# Patient Record
Sex: Female | Born: 1960 | Race: White | Hispanic: No | Smoking: Current every day smoker
Health system: Southern US, Community
[De-identification: ages and names within clinical notes are randomized; demographics above are authoritative.]

## PROBLEM LIST (undated history)

## (undated) DIAGNOSIS — K219 Gastro-esophageal reflux disease without esophagitis: Secondary | ICD-10-CM

## (undated) DIAGNOSIS — I10 Essential (primary) hypertension: Secondary | ICD-10-CM

## (undated) DIAGNOSIS — F32A Depression, unspecified: Secondary | ICD-10-CM

## (undated) DIAGNOSIS — J189 Pneumonia, unspecified organism: Secondary | ICD-10-CM

## (undated) DIAGNOSIS — R0602 Shortness of breath: Secondary | ICD-10-CM

## (undated) DIAGNOSIS — M797 Fibromyalgia: Secondary | ICD-10-CM

## (undated) DIAGNOSIS — B1920 Unspecified viral hepatitis C without hepatic coma: Secondary | ICD-10-CM

## (undated) DIAGNOSIS — M1990 Unspecified osteoarthritis, unspecified site: Secondary | ICD-10-CM

## (undated) DIAGNOSIS — F419 Anxiety disorder, unspecified: Secondary | ICD-10-CM

## (undated) DIAGNOSIS — J449 Chronic obstructive pulmonary disease, unspecified: Secondary | ICD-10-CM

## (undated) DIAGNOSIS — Z9981 Dependence on supplemental oxygen: Secondary | ICD-10-CM

## (undated) DIAGNOSIS — F329 Major depressive disorder, single episode, unspecified: Secondary | ICD-10-CM

## (undated) DIAGNOSIS — J302 Other seasonal allergic rhinitis: Secondary | ICD-10-CM

## (undated) DIAGNOSIS — E119 Type 2 diabetes mellitus without complications: Secondary | ICD-10-CM

## (undated) DIAGNOSIS — J329 Chronic sinusitis, unspecified: Secondary | ICD-10-CM

## (undated) DIAGNOSIS — R0902 Hypoxemia: Secondary | ICD-10-CM

## (undated) DIAGNOSIS — T7840XA Allergy, unspecified, initial encounter: Secondary | ICD-10-CM

## (undated) HISTORY — DX: Allergy, unspecified, initial encounter: T78.40XA

## (undated) HISTORY — PX: NASAL FRACTURE SURGERY: SHX718

## (undated) HISTORY — DX: Chronic sinusitis, unspecified: J32.9

## (undated) HISTORY — DX: Other seasonal allergic rhinitis: J30.2

## (undated) HISTORY — PX: MANDIBLE FRACTURE SURGERY: SHX706

## (undated) HISTORY — DX: Gastro-esophageal reflux disease without esophagitis: K21.9

## (undated) HISTORY — DX: Hypoxemia: R09.02

## (undated) HISTORY — DX: Chronic obstructive pulmonary disease, unspecified: J44.9

## (undated) HISTORY — DX: Anxiety disorder, unspecified: F41.9

## (undated) HISTORY — PX: OTHER SURGICAL HISTORY: SHX169

## (undated) HISTORY — DX: Dependence on supplemental oxygen: Z99.81

## (undated) HISTORY — DX: Pneumonia, unspecified organism: J18.9

---

## 2000-03-04 ENCOUNTER — Emergency Department (HOSPITAL_COMMUNITY): Admission: EM | Admit: 2000-03-04 | Discharge: 2000-03-04 | Payer: Self-pay | Admitting: Emergency Medicine

## 2000-12-24 ENCOUNTER — Encounter: Payer: Self-pay | Admitting: Emergency Medicine

## 2000-12-24 ENCOUNTER — Emergency Department (HOSPITAL_COMMUNITY): Admission: EM | Admit: 2000-12-24 | Discharge: 2000-12-24 | Payer: Self-pay

## 2001-12-19 ENCOUNTER — Encounter: Payer: Self-pay | Admitting: *Deleted

## 2001-12-19 ENCOUNTER — Emergency Department (HOSPITAL_COMMUNITY): Admission: EM | Admit: 2001-12-19 | Discharge: 2001-12-19 | Payer: Self-pay | Admitting: *Deleted

## 2003-03-25 ENCOUNTER — Emergency Department (HOSPITAL_COMMUNITY): Admission: EM | Admit: 2003-03-25 | Discharge: 2003-03-26 | Payer: Self-pay | Admitting: Emergency Medicine

## 2003-03-25 ENCOUNTER — Encounter: Payer: Self-pay | Admitting: Emergency Medicine

## 2004-02-19 ENCOUNTER — Emergency Department (HOSPITAL_COMMUNITY): Admission: EM | Admit: 2004-02-19 | Discharge: 2004-02-20 | Payer: Self-pay | Admitting: Family Medicine

## 2005-11-14 ENCOUNTER — Emergency Department (HOSPITAL_COMMUNITY): Admission: EM | Admit: 2005-11-14 | Discharge: 2005-11-15 | Payer: Self-pay | Admitting: Emergency Medicine

## 2006-07-28 ENCOUNTER — Emergency Department (HOSPITAL_COMMUNITY): Admission: EM | Admit: 2006-07-28 | Discharge: 2006-07-28 | Payer: Self-pay | Admitting: Emergency Medicine

## 2007-06-19 ENCOUNTER — Emergency Department (HOSPITAL_COMMUNITY): Admission: EM | Admit: 2007-06-19 | Discharge: 2007-06-19 | Payer: Self-pay | Admitting: Emergency Medicine

## 2007-09-15 ENCOUNTER — Encounter: Admission: RE | Admit: 2007-09-15 | Discharge: 2007-09-15 | Payer: Self-pay | Admitting: Family Medicine

## 2007-10-16 ENCOUNTER — Emergency Department (HOSPITAL_COMMUNITY): Admission: EM | Admit: 2007-10-16 | Discharge: 2007-10-17 | Payer: Self-pay | Admitting: Emergency Medicine

## 2007-10-16 ENCOUNTER — Ambulatory Visit: Payer: Self-pay | Admitting: Internal Medicine

## 2007-10-16 DIAGNOSIS — J449 Chronic obstructive pulmonary disease, unspecified: Secondary | ICD-10-CM | POA: Insufficient documentation

## 2007-10-23 ENCOUNTER — Ambulatory Visit: Payer: Self-pay | Admitting: Internal Medicine

## 2007-10-23 ENCOUNTER — Encounter: Payer: Self-pay | Admitting: Internal Medicine

## 2007-10-26 DIAGNOSIS — R0600 Dyspnea, unspecified: Secondary | ICD-10-CM | POA: Insufficient documentation

## 2007-10-26 DIAGNOSIS — R0609 Other forms of dyspnea: Secondary | ICD-10-CM

## 2007-11-06 ENCOUNTER — Ambulatory Visit: Payer: Self-pay | Admitting: Internal Medicine

## 2007-11-06 ENCOUNTER — Telehealth: Payer: Self-pay | Admitting: Internal Medicine

## 2007-11-06 DIAGNOSIS — R29818 Other symptoms and signs involving the nervous system: Secondary | ICD-10-CM | POA: Insufficient documentation

## 2007-11-08 ENCOUNTER — Telehealth (INDEPENDENT_AMBULATORY_CARE_PROVIDER_SITE_OTHER): Payer: Self-pay | Admitting: *Deleted

## 2007-12-15 ENCOUNTER — Emergency Department (HOSPITAL_COMMUNITY): Admission: EM | Admit: 2007-12-15 | Discharge: 2007-12-15 | Payer: Self-pay | Admitting: Emergency Medicine

## 2008-01-18 ENCOUNTER — Telehealth (INDEPENDENT_AMBULATORY_CARE_PROVIDER_SITE_OTHER): Payer: Self-pay | Admitting: *Deleted

## 2008-02-28 ENCOUNTER — Telehealth (INDEPENDENT_AMBULATORY_CARE_PROVIDER_SITE_OTHER): Payer: Self-pay | Admitting: *Deleted

## 2008-02-28 ENCOUNTER — Ambulatory Visit: Payer: Self-pay | Admitting: Internal Medicine

## 2008-02-28 DIAGNOSIS — G4700 Insomnia, unspecified: Secondary | ICD-10-CM | POA: Insufficient documentation

## 2008-03-11 ENCOUNTER — Emergency Department (HOSPITAL_COMMUNITY): Admission: EM | Admit: 2008-03-11 | Discharge: 2008-03-11 | Payer: Self-pay | Admitting: Emergency Medicine

## 2008-03-11 ENCOUNTER — Encounter: Payer: Self-pay | Admitting: Internal Medicine

## 2008-03-13 ENCOUNTER — Emergency Department (HOSPITAL_COMMUNITY): Admission: EM | Admit: 2008-03-13 | Discharge: 2008-03-13 | Payer: Self-pay | Admitting: Emergency Medicine

## 2008-03-28 ENCOUNTER — Ambulatory Visit: Payer: Self-pay | Admitting: Internal Medicine

## 2008-06-26 ENCOUNTER — Telehealth: Payer: Self-pay | Admitting: Internal Medicine

## 2008-07-05 ENCOUNTER — Telehealth (INDEPENDENT_AMBULATORY_CARE_PROVIDER_SITE_OTHER): Payer: Self-pay | Admitting: *Deleted

## 2008-07-07 ENCOUNTER — Emergency Department (HOSPITAL_COMMUNITY): Admission: EM | Admit: 2008-07-07 | Discharge: 2008-07-07 | Payer: Self-pay | Admitting: Emergency Medicine

## 2008-07-08 ENCOUNTER — Telehealth (INDEPENDENT_AMBULATORY_CARE_PROVIDER_SITE_OTHER): Payer: Self-pay | Admitting: *Deleted

## 2008-07-12 ENCOUNTER — Telehealth: Payer: Self-pay | Admitting: Internal Medicine

## 2008-07-16 ENCOUNTER — Ambulatory Visit: Payer: Self-pay | Admitting: Internal Medicine

## 2008-07-25 ENCOUNTER — Telehealth: Payer: Self-pay | Admitting: Internal Medicine

## 2008-08-05 ENCOUNTER — Ambulatory Visit: Payer: Self-pay | Admitting: Internal Medicine

## 2008-08-05 ENCOUNTER — Telehealth (INDEPENDENT_AMBULATORY_CARE_PROVIDER_SITE_OTHER): Payer: Self-pay | Admitting: *Deleted

## 2008-09-16 ENCOUNTER — Telehealth: Payer: Self-pay | Admitting: Internal Medicine

## 2008-09-19 ENCOUNTER — Encounter: Payer: Self-pay | Admitting: Internal Medicine

## 2008-10-15 ENCOUNTER — Telehealth (INDEPENDENT_AMBULATORY_CARE_PROVIDER_SITE_OTHER): Payer: Self-pay | Admitting: *Deleted

## 2008-10-16 ENCOUNTER — Emergency Department (HOSPITAL_COMMUNITY): Admission: EM | Admit: 2008-10-16 | Discharge: 2008-10-16 | Payer: Self-pay | Admitting: Emergency Medicine

## 2008-10-16 ENCOUNTER — Encounter: Payer: Self-pay | Admitting: Internal Medicine

## 2008-10-17 ENCOUNTER — Telehealth (INDEPENDENT_AMBULATORY_CARE_PROVIDER_SITE_OTHER): Payer: Self-pay | Admitting: *Deleted

## 2008-10-21 ENCOUNTER — Ambulatory Visit: Payer: Self-pay | Admitting: Internal Medicine

## 2008-10-21 DIAGNOSIS — J329 Chronic sinusitis, unspecified: Secondary | ICD-10-CM | POA: Insufficient documentation

## 2008-10-21 DIAGNOSIS — Z720 Tobacco use: Secondary | ICD-10-CM | POA: Insufficient documentation

## 2008-10-28 ENCOUNTER — Encounter: Payer: Self-pay | Admitting: Internal Medicine

## 2008-10-29 ENCOUNTER — Telehealth (INDEPENDENT_AMBULATORY_CARE_PROVIDER_SITE_OTHER): Payer: Self-pay | Admitting: *Deleted

## 2008-11-22 ENCOUNTER — Observation Stay (HOSPITAL_COMMUNITY): Admission: EM | Admit: 2008-11-22 | Discharge: 2008-11-23 | Payer: Self-pay | Admitting: Emergency Medicine

## 2008-11-24 ENCOUNTER — Emergency Department (HOSPITAL_COMMUNITY): Admission: EM | Admit: 2008-11-24 | Discharge: 2008-11-24 | Payer: Self-pay | Admitting: Emergency Medicine

## 2008-12-17 ENCOUNTER — Encounter: Admission: RE | Admit: 2008-12-17 | Discharge: 2008-12-17 | Payer: Self-pay | Admitting: Orthopedic Surgery

## 2008-12-25 ENCOUNTER — Telehealth (INDEPENDENT_AMBULATORY_CARE_PROVIDER_SITE_OTHER): Payer: Self-pay | Admitting: *Deleted

## 2008-12-27 ENCOUNTER — Ambulatory Visit: Payer: Self-pay | Admitting: Internal Medicine

## 2009-01-03 ENCOUNTER — Telehealth: Payer: Self-pay | Admitting: Internal Medicine

## 2009-01-08 ENCOUNTER — Telehealth: Payer: Self-pay | Admitting: Internal Medicine

## 2009-02-18 ENCOUNTER — Emergency Department (HOSPITAL_COMMUNITY): Admission: EM | Admit: 2009-02-18 | Discharge: 2009-02-18 | Payer: Self-pay | Admitting: Emergency Medicine

## 2009-02-18 ENCOUNTER — Telehealth: Payer: Self-pay | Admitting: Internal Medicine

## 2009-03-04 ENCOUNTER — Telehealth: Payer: Self-pay | Admitting: Internal Medicine

## 2009-04-02 ENCOUNTER — Telehealth: Payer: Self-pay | Admitting: Internal Medicine

## 2009-04-07 ENCOUNTER — Telehealth: Payer: Self-pay | Admitting: Internal Medicine

## 2009-04-21 ENCOUNTER — Encounter: Payer: Self-pay | Admitting: Internal Medicine

## 2009-04-21 ENCOUNTER — Emergency Department (HOSPITAL_COMMUNITY): Admission: EM | Admit: 2009-04-21 | Discharge: 2009-04-21 | Payer: Self-pay | Admitting: Emergency Medicine

## 2009-04-28 ENCOUNTER — Ambulatory Visit: Payer: Self-pay | Admitting: Internal Medicine

## 2009-05-05 ENCOUNTER — Encounter: Payer: Self-pay | Admitting: Internal Medicine

## 2009-05-12 ENCOUNTER — Telehealth (INDEPENDENT_AMBULATORY_CARE_PROVIDER_SITE_OTHER): Payer: Self-pay | Admitting: *Deleted

## 2009-05-21 ENCOUNTER — Encounter: Payer: Self-pay | Admitting: Internal Medicine

## 2009-06-27 ENCOUNTER — Encounter: Payer: Self-pay | Admitting: Internal Medicine

## 2009-06-30 ENCOUNTER — Telehealth (INDEPENDENT_AMBULATORY_CARE_PROVIDER_SITE_OTHER): Payer: Self-pay | Admitting: *Deleted

## 2009-08-07 ENCOUNTER — Telehealth: Payer: Self-pay | Admitting: Internal Medicine

## 2009-08-09 DIAGNOSIS — B1920 Unspecified viral hepatitis C without hepatic coma: Secondary | ICD-10-CM

## 2009-08-09 HISTORY — DX: Unspecified viral hepatitis C without hepatic coma: B19.20

## 2009-08-25 ENCOUNTER — Ambulatory Visit: Payer: Self-pay | Admitting: Internal Medicine

## 2009-09-11 ENCOUNTER — Encounter: Payer: Self-pay | Admitting: Internal Medicine

## 2009-10-06 ENCOUNTER — Telehealth: Payer: Self-pay | Admitting: Internal Medicine

## 2009-10-06 ENCOUNTER — Inpatient Hospital Stay (HOSPITAL_COMMUNITY): Admission: EM | Admit: 2009-10-06 | Discharge: 2009-10-09 | Payer: Self-pay | Admitting: Emergency Medicine

## 2009-10-08 ENCOUNTER — Telehealth: Payer: Self-pay | Admitting: Internal Medicine

## 2009-11-04 ENCOUNTER — Ambulatory Visit: Payer: Self-pay | Admitting: Internal Medicine

## 2009-11-04 ENCOUNTER — Telehealth: Payer: Self-pay | Admitting: Internal Medicine

## 2009-11-04 DIAGNOSIS — K219 Gastro-esophageal reflux disease without esophagitis: Secondary | ICD-10-CM | POA: Insufficient documentation

## 2010-02-05 ENCOUNTER — Inpatient Hospital Stay (HOSPITAL_COMMUNITY): Admission: EM | Admit: 2010-02-05 | Discharge: 2010-02-07 | Payer: Self-pay | Admitting: Emergency Medicine

## 2010-02-05 ENCOUNTER — Encounter: Payer: Self-pay | Admitting: Internal Medicine

## 2010-02-24 ENCOUNTER — Telehealth: Payer: Self-pay | Admitting: Internal Medicine

## 2010-02-24 ENCOUNTER — Ambulatory Visit: Payer: Self-pay | Admitting: Internal Medicine

## 2010-02-24 DIAGNOSIS — E119 Type 2 diabetes mellitus without complications: Secondary | ICD-10-CM | POA: Insufficient documentation

## 2010-02-27 ENCOUNTER — Encounter: Payer: Self-pay | Admitting: Internal Medicine

## 2010-03-02 ENCOUNTER — Emergency Department (HOSPITAL_COMMUNITY): Admission: EM | Admit: 2010-03-02 | Discharge: 2010-03-02 | Payer: Self-pay | Admitting: Emergency Medicine

## 2010-03-03 ENCOUNTER — Telehealth (INDEPENDENT_AMBULATORY_CARE_PROVIDER_SITE_OTHER): Payer: Self-pay | Admitting: *Deleted

## 2010-03-10 ENCOUNTER — Telehealth: Payer: Self-pay | Admitting: Internal Medicine

## 2010-04-27 ENCOUNTER — Ambulatory Visit: Payer: Self-pay | Admitting: Internal Medicine

## 2010-04-28 DIAGNOSIS — F411 Generalized anxiety disorder: Secondary | ICD-10-CM | POA: Insufficient documentation

## 2010-04-29 ENCOUNTER — Telehealth (INDEPENDENT_AMBULATORY_CARE_PROVIDER_SITE_OTHER): Payer: Self-pay | Admitting: *Deleted

## 2010-05-07 ENCOUNTER — Telehealth (INDEPENDENT_AMBULATORY_CARE_PROVIDER_SITE_OTHER): Payer: Self-pay | Admitting: *Deleted

## 2010-05-22 ENCOUNTER — Telehealth: Payer: Self-pay | Admitting: Internal Medicine

## 2010-05-27 ENCOUNTER — Observation Stay (HOSPITAL_COMMUNITY)
Admission: EM | Admit: 2010-05-27 | Discharge: 2010-05-29 | Payer: Self-pay | Source: Home / Self Care | Admitting: Emergency Medicine

## 2010-05-27 ENCOUNTER — Ambulatory Visit: Payer: Self-pay | Admitting: Internal Medicine

## 2010-06-01 ENCOUNTER — Telehealth (INDEPENDENT_AMBULATORY_CARE_PROVIDER_SITE_OTHER): Payer: Self-pay | Admitting: *Deleted

## 2010-06-24 ENCOUNTER — Encounter: Payer: Self-pay | Admitting: Internal Medicine

## 2010-07-27 ENCOUNTER — Ambulatory Visit: Payer: Self-pay | Admitting: Internal Medicine

## 2010-07-28 ENCOUNTER — Telehealth (INDEPENDENT_AMBULATORY_CARE_PROVIDER_SITE_OTHER): Payer: Self-pay | Admitting: *Deleted

## 2010-08-12 ENCOUNTER — Telehealth (INDEPENDENT_AMBULATORY_CARE_PROVIDER_SITE_OTHER): Payer: Self-pay | Admitting: *Deleted

## 2010-08-18 ENCOUNTER — Telehealth (INDEPENDENT_AMBULATORY_CARE_PROVIDER_SITE_OTHER): Payer: Self-pay | Admitting: *Deleted

## 2010-08-27 ENCOUNTER — Telehealth (INDEPENDENT_AMBULATORY_CARE_PROVIDER_SITE_OTHER): Payer: Self-pay | Admitting: *Deleted

## 2010-08-30 ENCOUNTER — Encounter: Payer: Self-pay | Admitting: Family Medicine

## 2010-09-08 ENCOUNTER — Telehealth (INDEPENDENT_AMBULATORY_CARE_PROVIDER_SITE_OTHER): Payer: Self-pay | Admitting: *Deleted

## 2010-09-08 DIAGNOSIS — B1710 Acute hepatitis C without hepatic coma: Secondary | ICD-10-CM | POA: Insufficient documentation
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
## 2010-09-08 NOTE — Medication Information (Signed)
Summary: PAP [facility] & Combivent/[facility]  PAP [facility] Combivent/[facility]   Imported By: Lester [place] 06/30/2009 10:42:17  _____________________________________________________________________  External Attachment:    Type:   Image     Comment:   External Document

## 2010-09-08 NOTE — Medication Information (Signed)
Summary: Medication List/[facility]  Medication List/[facility]   Imported By: Sherian Rein 02/27/2010 08:11:32  _____________________________________________________________________  External Attachment:    Type:   Image     Comment:   External Document

## 2010-09-08 NOTE — Miscellaneous (Signed)
Summary: Orders UpdatePFT CHARGES   Clinical Lists Changes  Orders: Added new Service order of Carbon Monoxide diffusing w/capacity (94720) - Signed Added new Service order of Lung Volumes (94240) - Signed Added new Service order of Spirometry (Pre & Post) (94060) - Signed 

## 2010-09-08 NOTE — Procedures (Signed)
Summary: Oximetry/[facility]  Oximetry/[facility]   Imported By: Sherian Rein 03/13/2010 13:08:24  _____________________________________________________________________  External Attachment:    Type:   Image     Comment:   External Document
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
## 2010-09-08 NOTE — Progress Notes (Signed)
Summary: Letter stating illness is terminal   Phone Note Call from Patient   Caller: Patient Call For: young Summary of Call: need letter stating her condition is terminal for disability. Initial call taken by: Rickard Patience,  May 07, 2010 4:39 PM  Follow-up for Phone Call        Called and spoke with pt.  She states that she was turned down for disability and now is requesting that CDY dictate a letter to Donnamarie Rossetti [facility]) stating that her condition is a terminal illness and therefore needs disability.  If CDY does do a letter, she would like this to be mailed to her home.  Please advise thanks Follow-up by: Vernie Murders,  May 07, 2010 5:16 PM  Additional Follow-up for Phone Call Additional follow up Details #1::        I am sorry but her condition is not a terminal illness and I cannot write this letter. Additional Follow-up by: Waymon Budge MD,  May 07, 2010 5:26 PM    Additional Follow-up for Phone Call Additional follow up Details #2::    ATC pt's home number x 3 -- line busy.  WCB Crystal Levandowski RN  May 08, 2010 9:39 AM   Spoke with pt.  She was informed of above per CY and verbalized understanding.    Follow-up by: Gweneth Dimitri RN,  May 08, 2010 10:34 AM

## 2010-09-08 NOTE — Progress Notes (Signed)
Summary: prescription questions  Phone Note From Pharmacy   Caller: amy[facility] Call For: Megan Soto  Summary of Call: Has a question about hydrocodone -acetaminophen 10-660 mg rx.Darletta Moll,  November 04, 2009 11:48 AM     Follow-up for Phone Call        [facility] pharmacy wants to make CY aware that pt just filled Hydrocodone-apap 10/500 #120 tablets, one tab four times daily on 10/18/09 by Dr. Thurmond Butts. They wanted to know make CY aware before they fiiled his rx. Please advise. Carron Curie CMA  November 04, 2009 12:19 PM   TD asked CY and per him do not fill his rx, and have it discontinued at the pharmacy. I called and spoke to Amy at [facility] and advised do not fill CY rx and to canel it out. She will notify pt. Carron Curie CMA  November 04, 2009 12:25 PM

## 2010-09-08 NOTE — Progress Notes (Signed)
Summary: rx req-LMTCB x 1-LMTCbx1  Phone Note Call from Patient Call back at Home Phone [phone]   Caller: Patient Call For: Florestine Carmical Summary of Call: pt wants rx for combivent called in to [facility]. wants nurse to "overide rx" so medicaid will cover since she has used up her combivent as well as ventolin (she doesn't want ventolin- doesn't work as well per pt).  Initial call taken by: Tivis Ringer, CNA,  May 22, 2010 11:59 AM  Follow-up for Phone Call        Called and spoke with pharmacist at [facility].  She states that there is an rx there for combivent waiting for her- However, she informed me that she had med filled under Dr Maple Hudson on 05/09/10 and then gave pharmacy new rx from a Dr Erlinda Hong on 05/13/10 with new directions to take 8 puffs of combivent daily.  Pharmacist did not fill this yet.  Need to find out why she is using so  much and why rx was written by another doc-LMTCB  Follow-up by: Vernie Murders,  May 22, 2010 1:44 PM  Additional Follow-up for Phone Call Additional follow up Details #1::        Spoke with pt.  She states that she has already picked up rx for combivent.  She states that she has been using this 5 times per day at least due to increased stress from spouse being sick and also "doing alot of running around"- will forward to cdy to be aware. Additional Follow-up by: Vernie Murders,  May 22, 2010 3:25 PM    Additional Follow-up for Phone Call Additional follow up Details #2::    I am not going to authorize over-rides. The meds were scripted the way they are supposed to be used.  Follow-up by: Waymon Budge MD,  May 26, 2010 1:15 PM  Additional Follow-up for Phone Call Additional follow up Details #3:: Details for Additional Follow-up Action Taken: LMTCBx1 to advise no over-rides on Rx.Jennifer [facility] CMA  May 26, 2010 2:01 PM    called to make pt aware of CY recs about the combivent---pts husband stated that the pt is in the  hospital at [facility] [place] CMA  May 28, 2010 4:05 PM
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
## 2010-09-08 NOTE — Progress Notes (Signed)
Summary: RESULTS  Phone Note Call from Patient   Caller: Patient Call For: Mahika Vanvoorhis Summary of Call: PT CALLING FOR OVER NITE OXYGEN RESULTS. LEAVE MGS IF NO ANSWER. Initial call taken by: Rickard Patience,  March 10, 2010 10:06 AM  Follow-up for Phone Call        Please advise on ONO results from [facility].Michel Bickers [facility]  March 10, 2010 10:25 AM  Additional Follow-up for Phone Call Additional follow up Details #1::        Overnight oxygen levels were within normal limits on room air. They won't pay for oxygen for her. Additional Follow-up by: Waymon Budge MD,  March 11, 2010 12:36 PM    Additional Follow-up for Phone Call Additional follow up Details #2::    Pt informed. Ryiah Bellissimo CMA  March 11, 2010 12:40 PM

## 2010-09-08 NOTE — Letter (Signed)
Summary: Internal Correspondence  Internal Correspondence   Imported By: Lehman Prom 09/19/2008 16:09:56  _____________________________________________________________________  External Attachment:    Type:   Image     Comment:   External Document

## 2010-09-08 NOTE — Progress Notes (Signed)
Summary: cough  Phone Note Call from Patient   Caller: Patient Call For: Odilia Damico Summary of Call: need cough syrup for cough [facility] [phone] Initial call taken by: Rickard Patience,  March 04, 2009 4:33 PM  Follow-up for Phone Call        pt c/o cough with clear mucus and is requesting an rx forpromethazine w/ cod. she denies any sob, wheezing, sore throat,or fever. pt recently seen in ed. pt says she is unable to come in for ov. pls advise. NKDA listed for this pt--Patient advised to call 911    Additional Follow-up for Phone Call Additional follow up Details #1::        per cdy pt will need ov for cough syrup--pt aware Additional Follow-up by: Philipp Deputy CMA,  March 04, 2009 6:01 PM
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
## 2010-09-08 NOTE — Progress Notes (Signed)
Summary: cough  Phone Note Call from Patient Call back at Home Phone [phone]   Caller: Patient Call For: young Summary of Call: pt is having coughing problems straining when coughing what should she do. wants to be seen. Initial call taken by: Lacinda Axon,  August 05, 2008 10:17 AM  Follow-up for Phone Call        Spoke with pt.  She states that cough has worsened.  Coughing to the point of vommiting.  Sputum is thick and green.  ov with CY at 2 pm today. Follow-up by: Vernie Murders,  August 05, 2008 10:39 AM

## 2010-09-08 NOTE — Progress Notes (Signed)
Summary: coughing up blood  Phone Note Call from Patient Call back at Home Phone [phone]   Caller: Patient Call For: Anagabriela Jokerst Summary of Call: pt states she has been coughing up blood since yesterday. says " everytime she coughs there is blood". light red in color.  Initial call taken by: Tivis Ringer,  February 18, 2009 11:10 AM  Follow-up for Phone Call        called and spoke with pt.  pt c/o hemoptysis since yesterday.  Pt states she will cough up thick white sputum with bright red blood in it.  Pt also c/o increased sob, tightness in chest, and wheezing.  Offered pt an appt today.  Pt declined stating she did not have a ride.  Please avise.  Aundra Millet Reynolds LPN  February 18, 2009 11:17 AM  NKDA   Additional Follow-up for Phone Call Additional follow up Details #1::        Per Dr. Maple Hudson, call in doxycycline 100 mg # 8 2 today and then 1 daily and also needs to try delsym OTC cough syrup.  ATC pt with recs.  Was advised per family member that pt has just been taken to the ER at [facility].  Advised that I would let Dr. Maple Hudson know.   Additional Follow-up by: Vernie Murders,  February 18, 2009 2:00 PM
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
## 2010-09-08 NOTE — Progress Notes (Signed)
Summary: sample-letter to lawyer- pt called back again  Phone Note Call from Patient Call back at Home Phone [phone]   Caller: Patient Call For: Megan Soto Reason for Call: Talk to Nurse Summary of Call: sample of spiriva mailed to her and has [employer] told pt's lawyer that pt is unable to work? Initial call taken by: Eugene Gavia,  August 07, 2009 1:55 PM  Follow-up for Phone Call        advised pt that we could not mail samples. Asked pt about spiriva assistance program. Pt states she never heard anything from the company. I see the paperwork scanned in. I will forward this message to [facility] to ask if they can follow-up on paperwork. Also Pt request that Dr. Maple Hudson write a letter stating that the patient is unable to work. She request this be sent to her lawyer [name]. Please advise if I can give the pt some samples of spiriva and on letter. thanks. Carron Curie CMA  August 07, 2009 2:25 PM   Additional Follow-up for Phone Call Additional follow up Details #1::        i will follow up on the spiriva program but dr Delontae Lamm will need to do the rest    Additional Follow-up for Phone Call Additional follow up Details #2::    pt called back again today re: status of letter to be sent to her lawyer. call pt at [phone]. Tivis Ringer  August 11, 2009 3:00 PM  Additional Follow-up for Phone Call Additional follow up Details #3:: Details for Additional Follow-up Action Taken: I hlast saw her in September, and am not able to send a letter about her work status. Additional Follow-up by: Waymon Budge MD,  August 12, 2009 1:18 PM  pt has appt scheduled for 08/25/2009 with CY. Carron Curie CMA  August 12, 2009 2:25 PM

## 2010-09-08 NOTE — Progress Notes (Signed)
Summary: disability  Phone Note Call from Patient Call back at Home Phone [phone]   Caller: Patient Call For: Megan Soto Reason for Call: Talk to Nurse Summary of Call: pt wants to know if CY will call social security office and tell them what is wrong with her to hurry her disability process?  They can fax everything to him [phone].  [address] Initial call taken by: Eugene Gavia,  July 25, 2008 3:02 PM  Follow-up for Phone Call        Discussed at office visit. Follow-up by: Waymon Budge MD,  August 07, 2008 1:10 PM

## 2010-09-08 NOTE — Assessment & Plan Note (Signed)
Summary: COPD, SOB/RJC  Nurse Visit   Vital Signs:  Patient Profile:   50 Years Old Female Pulse rate:   92 / minute BP sitting:   114 / 76  Vitals Entered By: Boone Master CNA (October 23, 2007 5:23 PM)                 Prior Medications: COMBIVENT 103-18 MCG/ACT  AERO (IPRATROPIUM-ALBUTEROL) as needed AMITRIPTYLINE HCL 25 MG  TABS (AMITRIPTYLINE HCL) take 1 to 2 tablets at bedtime Current Allergies: No known allergies     Orders Added: 1)  Pulmonary Stress (6 min walk) [place]    ]  Six Minute Walk Test Medications taken before test(dose and time): vicodin taken at 9am Supplemental oxygen during the test: No  Lap counter(place a tick mark inside a square for each lap completed) lap 1 complete  lap 2 complete   lap 3 complete   lap 4 complete  lap 5 complete  lap 6 complete  lap 7 complete   lap 8 complete   lap 9 complete   Baseline  BP sitting: 114/ 76 Heart rate: 92 Dyspnea ( Borg scale) 7 Fatigue (Borg scale) 3 SPO2 97% ra  End Of Test  BP sitting: 110/ 70 Heart rate: 109 Dyspnea ( Borg scale) 7 Fatigue (Borg scale) 2 SPO2 99% ra  2 Minutes post  BP sitting: 112/ 74 Heart rate: 81 SPO2 100% ra  Stopped or paused before six minutes? No Other symptoms at end of exercise: Dizziness, Leg pain  Interpretation: Number of laps  9 X 48 meters =   432 meters+ final partial lap: 28 meters =    460 meters   Total distance walked in six minutes: 460 meters  Tech ID: Boone Master CNA (October 23, 2007 5:25 PM) Tech Comments pt completed test without stopping.  complained of dizziness and back pain at end of test, resolved with rest.

## 2010-09-08 NOTE — Medication Information (Signed)
Summary: Tax adviser   Imported By: Valinda Hoar 03/11/2008 15:53:11  _____________________________________________________________________  External Attachment:    Type:   Image     Comment:   External Document

## 2010-09-08 NOTE — Assessment & Plan Note (Signed)
Summary: copd/ mbw   Visit Type:  Initial Consult Referred by:  Jeannetta Nap PCP:  Dr. Jeannetta Nap  Chief Complaint:  consult.  History of Present Illness: 50 YOF seen in pulmonary consultation for Dr. Jeannetta Nap for her complaint of difficulty breathing.She smokes 1.5 packs cigs /day. Told she had COPD a year ago, and says she had severe asthma as a child., with hospitalization but no ventilator. Has used a home nebulizer borrowed from someone else. It does help. Mostly using a combivent inhaler. More short of breath this winter. Coughing white-yellow with occasional small streak of heme. Tussive substernal soreness. Denies hx of pneumonia, TB exposure or heart disease.     Current Allergies (reviewed today): No known allergies   Past Medical History:    GERD with some esophageal dysmotility    Seasonal allergic rhinitis    Asthma    COPD  Past Surgical History:    Reviewed history and no changes required:       repair lacerated fingers   Family History:    Reviewed history and no changes required:       Family History COPD   Social History:    Reviewed history and no changes required:       Patient is a current smoker.        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   Vital Signs:  Patient Profile:   50 Years Old Female Weight:      123.38 pounds O2 Sat:      96 % O2 treatment:    Room Air Pulse rate:   119 / minute BP sitting:   110 / 60  (left arm) Cuff size:   regular  Vitals Entered By: Darra Lis RMA (October 16, 2007 1:43 PM)             Is Patient Diabetic? No Comments Medications reviewed with patient ..................................................................Marland KitchenDarra Lis RMA  October 16, 2007 1:53 PM      Physical Exam   General:     normal appearance.   Head:     normocephalic and atraumatic Eyes:     PERRLA/EOM intact; conjunctiva and sclera clear Ears:     TMs intact and clear with normal canals Nose:     sniffing Mouth:     no deformity or lesions Neck:     no JVD.   Lungs:     cough, puffing no rales, rhonchi or wheeze Heart:     regular rate and rhythm, S1, S2 without murmurs, rubs, gallops, or clicks Extremities:     no clubbing, cyanosis, edema, or deformity noted Cervical Nodes:     no significant adenopathy Axillary Nodes:     no significant adenopathy     Problem # 1:  COPD (ICD-496) Tobacco cessation is emphasized. She seems to think I will do bronchoscopy- why? Will get cxr and PFT, let her try Symbicort. She didn't like Advair but can't descri be why. Her updated medication list for this problem includes:    Combivent 103-18 Mcg/act Aero (Ipratropium-albuterol) .Marland Kitchen... As needed  Orders: Consultation Level III ([address]) T-2 View CXR, Same Day (71020.5TC) Pulmonary Referral (Pulmonary)   Medications Added to  Medication List This Visit: 1)  Combivent 103-18 Mcg/act Aero (Ipratropium-albuterol) .... As needed 2)  Amitriptyline Hcl 25 Mg Tabs (Amitriptyline hcl) .... Take 1 to 2 tablets at bedtime   Patient Instructions: 1)  Please schedule a follow-up appointment in 3 weeks. 2)  A chest x-ray has been recommended.  Your imaging study may require preauthorization.  3)  Scheduling Pulmonary Function Test 4)  Please try to stop smoking. 5)  Try sample Symbicort, 2 puffs and rinse, twice every day. You may still use yoiur Combivent rescue inhaler.    ]

## 2010-09-08 NOTE — Assessment & Plan Note (Signed)
Summary: Megan Soto   Visit Type:  Follow-up Referred by:  Jeannetta Nap PCP:  Dr. Jeannetta Nap  Chief Complaint:  short of breath and productive (green) cough x 2 weeks - wants meds for pain and cough at night.  History of Present Illness: Current Problems:  DYSPNEA (ICD-786.05) COPD (ICD-496) MUSCULOSKELETAL PAIN (ICD-24.74)  50-year-old smoker returned for follow up of chronic cough.  She says she coughs until she sees spots in front of her eyes.  Trying slowly to reduce cigarettes.  Is out of her nebulizer medication.  Applying for disability.  Asks medication help for sleep and for cough.  No seizure abdominal soreness.  Spiriva helps.  Chest x-ray March 9 had shown mild COPD.  Pulmonary function testing: FEV1 1.62/68%.  No change after bronchodilator. FEF 25 -- 75, showed slowing with significant response to bronchodilator.  Lung volumes showed hyperinflation with air trapping.  Diffusion capacity was reduced at 50%.  Overall impression: Mild to moderate obstructive airways disease with response to bronchodilator.  Emphysema pattern.  Six minute walk test: 97%, 99%, 100% on room air walking 460 m.       Current Allergies: No known allergies   Past Medical History:    Reviewed history from 11/06/2007 and no changes required:       GERD with some esophageal dysmotility       Seasonal allergic rhinitis       Asthma       COPD       Tobacco use     Review of Systems      See HPI   Vital Signs:  Patient Profile:   50 Years Old Female Weight:      127.13 pounds O2 Sat:      99 % O2 treatment:    Room Air Pulse rate:   104 / minute BP sitting:   110 / 60  (left arm) Cuff size:   regular  Vitals Entered ByMarinus Maw (February 28, 2008 10:44 AM)             Comments Medications reviewed with patient Marinus Maw  February 28, 2008 10:44 AM      Physical Exam  GENERAL:  A/Ox3; pleasant & cooperative.NAD, fanning herself unless distracted. HEENT:  [place]/AT, EOM-wnl,  PERRLA, EACs-clear, TMs-wnl, NOSE-clear, THROAT-clear & wnl. NECK:  Supple w/ fair ROM; no JVD; normal carotid impulses w/o bruits; no thyromegaly or nodules palpated; no lymphadenopathy. CHEST: Coarse BS without wheezing ABDOMEN:  Soft & nt; nml bowel sounds; no organomegaly or masses detected. EXT: Warm bilat,  no calf pain, edema, clubbing, pulses intact Skin: no rash/lesion        Impression & Recommendations:  Problem # 1:  COPD (ICD-496) moderate COPD, which will get worse, if she continues to smoke.  Smoking cessation was carefully discussed today in an attempt to motivate her.  We reviewed available options.  Plan: Refill.  Prescription for albuterol nebulizer solution.  Phenergan With Codeine cough syrup to Temazepam 30 mg.  Medications Added to Medication List This Visit: 1)  Albuterol Sulfate (2.5 Mg/40ml) 0.083% Nebu (Albuterol sulfate) .Marland Kitchen.. 1 neb four times a day as needed 2)  Promethazine-codeine 6.25-10 Mg/18ml Syrp (Promethazine-codeine) .Marland Kitchen.. 1 tsp four times a day as needed cough 3)  Temazepam 30 Mg Caps (Temazepam) .Marland Kitchen.. 1 at bedtime as needed sleep   Patient Instructions: 1)  Please schedule a follow-up appointment in 4 months. 2)  Please try to stop smoking 3)  Neb solution albuterol sent to your  drug store 4)  Scripts for cough syrup and sleeping pill printed. Use them sparingly, only when you have to. I won't refill them again until I see you back in the office.   Prescriptions: TEMAZEPAM 30 MG  CAPS (TEMAZEPAM) 1 at bedtime as needed sleep  #30 x 1   Entered and Authorized by:   Waymon Budge MD   Signed by:   Waymon Budge MD on 02/28/2008   Method used:   Print then Give to Patient   RxID:   [id number] PROMETHAZINE-CODEINE 6.25-10 MG/5ML  SYRP (PROMETHAZINE-CODEINE) 1 tsp four times a day as needed cough  #200 ml x 0   Entered and Authorized by:   Waymon Budge MD   Signed by:   Waymon Budge MD on 02/28/2008   Method used:   Print then Give  to Patient   RxID:   [id number] ALBUTEROL SULFATE (2.5 MG/3ML) 0.083%  NEBU (ALBUTEROL SULFATE) 1 neb four times a day as needed  #50 x prn   Entered and Authorized by:   Waymon Budge MD   Signed by:   Waymon Budge MD on 02/28/2008   Method used:   Electronically sent to ...       [facility]  [address] [phone]*       [address]       [place], [place]  [address]       Ph: [phone] or [phone]       Fax: [phone]   RxID:   [id number]  ]

## 2010-09-08 NOTE — Progress Notes (Signed)
Summary: new rx req  Phone Note Call from Patient Call back at Home Phone [phone]   Caller: Patient Call For: young Summary of Call: pt was recently seen. she is requesting a rx be sent to [facility] pharmacy for "2 combivents at 1 time". Initial call taken by: Tivis Ringer, CNA,  April 29, 2010 2:03 PM  Follow-up for Phone Call        Pt aware that insurance will not cover for 2 inhalers a month as this is an as needed med. She is aware.Reynaldo Minium CMA  April 29, 2010 4:01 PM

## 2010-09-08 NOTE — Progress Notes (Signed)
Summary: cough  Phone Note Call from Patient Call back at [phone]   Caller: Patient Call For: young Summary of Call: need something for cough [facility] Initial call taken by: Rickard Patience,  July 05, 2008 1:10 PM  Follow-up for Phone Call        Pt c/o a dry cough and sob. She has fnished Doxy given on 06/26/08. She would like refill on the promethazine. She says she has tried Robitussin and Mucines and they are not helping the cough. Please advise. Michel Bickers CMA  July 05, 2008 1:23 PM   okay per CY fpr pt to have refill on prom-cod syrup.  called into pharm with 0 refill number .  attempted to call pt, but line was busy. Follow-up by: Boone Master CNA,  July 05, 2008 2:08 PM      Prescriptions: PROMETHAZINE-CODEINE 6.25-10 MG/5ML  SYRP (PROMETHAZINE-CODEINE) 1 tsp four times a day as needed cough  #200 Millilit x 0   Entered by:   Boone Master CNA   Authorized by:   Waymon Budge MD   Signed by:   Boone Master CNA on 07/05/2008   Method used:   Telephoned to ...       [facility] / [facility] (retail)       [address]       [place], [place]  [address]       Ph: [phone]       Fax: [phone]   RxID:   [id number]

## 2010-09-08 NOTE — Progress Notes (Signed)
Summary: SOB  Phone Note Call from Patient Call back at [phone]   Caller: Patient Call For: Westlee Devita Summary of Call: NEED SOMETHING FOR COUGH AND SOB PHARMACY LANE  Babs Bertin DR Initial call taken by: Rickard Patience,  June 26, 2008 2:06 PM  Follow-up for Phone Call        Pt c/o increased sob and a dry, hacky cough for 2-3 days. She denies any fever or wheezing. Would like to have medication called to [facility]. Please advise. Michel Bickers CMA  June 26, 2008 2:22 PM  Additional Follow-up for Phone Call Additional follow up Details #1::        per CY: mucinex dm 1-2 two times a day as needed for cough; doxycycline 100mg  number 8, 2 today then 1 daily until gone.  called into pharmacy, pt aware. Additional Follow-up by: Boone Master CNA,  June 26, 2008 4:31 PM    New/Updated Medications: MUCINEX DM 30-600 MG XR12H-TAB (DEXTROMETHORPHAN-GUAIFENESIN) 1-2 every 12 hours as needed for cough DOXYCYCLINE HYCLATE 100 MG TABS (DOXYCYCLINE HYCLATE) 2 tabs today, then 1 tab daily unitl gone   Prescriptions: DOXYCYCLINE HYCLATE 100 MG TABS (DOXYCYCLINE HYCLATE) 2 tabs today, then 1 tab daily unitl gone  #8 x 0   Entered by:   Boone Master CNA   Authorized by:   Waymon Budge MD   Signed by:   Boone Master CNA on 06/26/2008   Method used:   Telephoned to ...       [facility] / [facility] (retail)       [address]       [place], [place]  [address]       Ph: [phone]       Fax: [phone]   RxID:   [id number]
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
## 2010-09-08 NOTE — Progress Notes (Signed)
Summary: appt  Phone Note Call from Patient Call back at Home Phone [phone]   Caller: Patient Call For: Savhanna Sliva Reason for Call: Talk to Nurse Summary of Call: having extreme sob, wheezing, hot sweats, has been on prednisone, needs to be seen. Initial call taken by: Eugene Gavia,  October 06, 2009 3:16 PM  Follow-up for Phone Call        FYI FOR DR Tiburcio Pea in the home called 911 for pt because she was gasping for air and unable to talk--ems was in the home when i called back Follow-up by: Philipp Deputy CMA,  October 06, 2009 3:47 PM  Additional Follow-up for Phone Call Additional follow up Details #1::        Noted Additional Follow-up by: Waymon Budge MD,  October 06, 2009 10:11 PM

## 2010-09-08 NOTE — Progress Notes (Signed)
Summary: refill  Phone Note Call from Patient Call back at Home Phone [phone]   Caller: Patient Call For: young Reason for Call: Refill Medication, Talk to Nurse Summary of Call: need spiriva called in  [facility] Initial call taken by: Eugene Gavia,  July 08, 2008 8:28 AM  Follow-up for Phone Call        rx called into pharmacy with 6 refills.  pt is aware. Follow-up by: Boone Master CNA,  July 08, 2008 1:21 PM    New/Updated Medications: SPIRIVA HANDIHALER 18 MCG  CAPS (TIOTROPIUM BROMIDE MONOHYDRATE) Inhale contents of 1 capsule once a day   Prescriptions: SPIRIVA HANDIHALER 18 MCG  CAPS (TIOTROPIUM BROMIDE MONOHYDRATE) Inhale contents of 1 capsule once a day  #30 x 6   Entered by:   Boone Master CNA   Authorized by:   Waymon Budge MD   Signed by:   Boone Master CNA on 07/08/2008   Method used:   Telephoned to ...       [facility] Pier[facility] / [facility] (retail)       [address]       [place], [place]  [address]       Ph: [phone]       Fax: [phone]   RxID:   [id number]

## 2010-09-08 NOTE — Progress Notes (Signed)
Summary: Asks note supporting disability  Phone Note Call from Patient Call back at Home Phone [phone]   Caller: Patient Call For: Shani Fitch Reason for Call: Talk to Nurse Summary of Call: wants a note for disability stating pt is unable to work. Initial call taken by: Eugene Gavia,  September 16, 2008 2:35 PM  Follow-up for Phone Call        pt has contacted an attorney to apply for disability, needs note from dr Serenity Batley stating she is unable to work and a list of her medical conditions for her inability to work--pls call pt when letter is ready Follow-up by: Philipp Deputy CMA,  September 16, 2008 4:41 PM  Additional Follow-up for Phone Call Additional follow up Details #1::        Dictated letter. Additional Follow-up by: Waymon Budge MD,  September 18, 2008 4:32 PM    Additional Follow-up for Phone Call Additional follow up Details #2::    I spoke with Synetta Fail in transcription and she said that this letter will be typed up and sent up in a envelope for your signature on Thursday, 09-19-08.Michel Bickers [facility]  September 18, 2008 4:59 PM  letter received and signed by Dr. Maple Hudson.  called spoke with patient, she would like   Additional Follow-up for Phone Call Additional follow up Details #3:: Details for Additional Follow-up Action Taken: Spoke with pt; mailed letter to home address. Reynaldo Minium CMA  September 24, 2008 11:24 AM

## 2010-09-08 NOTE — Progress Notes (Signed)
Summary: home health care refusal  Phone Note From Other Clinic   Caller: mary at [facility] Call For: Megan Soto Summary of Call: caller says that pt does not want home health care at this time.  Initial call taken by: Tivis Ringer,  April 07, 2009 9:31 AM  Follow-up for Phone Call        Dr. Maple Hudson...FYI. pt refused home health care at this time.  Follow-up by: Carron Curie CMA,  April 07, 2009 9:40 AM  Additional Follow-up for Phone Call Additional follow up Details #1::        Noted Additional Follow-up by: Waymon Budge MD,  April 07, 2009 5:28 PM

## 2010-09-08 NOTE — Miscellaneous (Signed)
Summary: Outstanding orders/[facility]  Outstanding orders/[facility]   Imported By: Lester [place] 07/06/2010 09:32:26  _____________________________________________________________________  External Attachment:    Type:   Image     Comment:   External Document

## 2010-09-08 NOTE — Progress Notes (Signed)
Summary: appt  Phone Note Call from Patient Call back at Home Phone [phone]   Caller: Patient Call For: young Reason for Call: Talk to Nurse Summary of Call: pt resch appt from today to 03/17 - wants nurse to call her. Initial call taken by: Eugene Gavia,  October 17, 2008 8:04 AM  Follow-up for Phone Call        had cancellation for Monday, called and gave this appt to pt.  Pt said ok she would be here. Follow-up by: Eugene Gavia,  October 17, 2008 8:35 AM

## 2010-09-08 NOTE — Progress Notes (Signed)
Summary: refill diazepam  Phone Note Call from Patient Call back at Home Phone [phone]   Caller: Patient Call For: Dr. Maple Hudson Summary of Call: would like refills on diazepam 5mg .  states that she contacted her pharmacy, but they told her they have not heard anything.  [facility] [address]  okay to leave message.  Initial call taken by: Boone Master CNA,  Dec 25, 2008 12:23 PM  Follow-up for Phone Call        pls advise if this is ok to refill?  Philipp Deputy [facility]  Dec 25, 2008 12:26 PM   Additional Follow-up for Phone Call Additional follow up Details #1::        OK to refill valium 5 mg one daily, if needed.  Additional Follow-up by: Waymon Budge MD,  Dec 25, 2008 2:34 PM    Additional Follow-up for Phone Call Additional follow up Details #2::    Called in rx refill to pharmacy per CY.  Called spoke with pt advised rx refill sent to pharmacy. Follow-up by: Cloyde Reams RN,  Dec 25, 2008 2:49 PM    Prescriptions: DIAZEPAM 5 MG TABS (DIAZEPAM) 1 at bedtime if needed  #30 x 0   Entered by:   Cloyde Reams RN   Authorized by:   Waymon Budge MD   Signed by:   Cloyde Reams RN on 12/25/2008   Method used:   Telephoned to ...       [facility]  [address] [phone][facility]ail[facility]       [address]       [place], [place]  [id number]       Ph: [phone] or [phone]       Fax: [phone]   RxID:   [id number]

## 2010-09-08 NOTE — Progress Notes (Signed)
Summary: record request  Phone Note Call from Patient Call back at Home Phone [phone]   Caller: Patient Call For: Megan Soto Summary of Call: pt wants copies of all records mailed to her for her attorney, she has appt next week with her attorney, pt states cy told her at her 5/21 ov he would take care of this Initial call taken by: Philipp Deputy CMA,  January 08, 2009 12:08 PM  Follow-up for Phone Call        Attorney: [name] [phone] fax [phone]. Ok to send to attorney the last 4 OV notes per [facility]. pt aware we are sending at her request. Reynaldo Minium CMA  January 09, 2009 5:12 PM

## 2010-09-08 NOTE — Progress Notes (Signed)
  Phone Note Other Incoming   Request: Send information Summary of Call: Request for records received from [facility]. Request forwarded to [employer].

## 2010-09-08 NOTE — Progress Notes (Signed)
Summary: rx problems- temazepam  Phone Note Call from Patient   Caller: Patient Call For: young Summary of Call: pt saw dr young this am. says medicaid won't pay for her rx of temazepam without knowing what rx is for. [facility] on [address]. call pt at her dad's house [phone].  Initial call taken by: Tivis Ringer,  February 28, 2008 5:05 PM  Follow-up for Phone Call        Spoke with pharmacy [facility], pharmacist stated that this rx requires a PA.  I am awaiting fax form to be sent. Vernie Murders  February 28, 2008 5:11 PM  Recieved PA request form from [facility].  Called for PA.  They are sending form to be filled out by [facility].  LMOMTCB and make pt aware.  Form given to CY to complete. Follow-up by: Vernie Murders,  February 28, 2008 5:24 PM  Additional Follow-up for Phone Call Additional follow up Details #1::        Spoke with pt advised of prior auth stautus underway.  Will notify pt's pharmacy when approval or denial received.  Pt will continue to check with pharmacy. Additional Follow-up by: Cloyde Reams RN,  February 29, 2008 10:06 AM

## 2010-09-08 NOTE — Progress Notes (Signed)
Summary: rx denied  Phone Note Call from Patient Call back at Home Phone [phone]   Caller: Patient Call For: Kashvi Prevette Reason for Call: Talk to Nurse Summary of Call: pt wants to know why [facility] denied her Clorazepate. Initial call taken by: Eugene Gavia,  February 24, 2010 4:49 PM  Follow-up for Phone Call        will forward message to CY ( per Florentina Addison) so he can discuss why pt's med was denied.  Aundra Millet Reynolds LPN  February 24, 2010 4:52 PM   Additional Follow-up for Phone Call Additional follow up Details #1::        Denied because of significant number of additional scripts she has been filling, but din't tell me about, for  hydrocodone, zolpidem and diazepam as reported by pharmacy. Additional Follow-up by: Waymon Budge MD,  February 25, 2010 8:28 AM
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
## 2010-09-08 NOTE — Medication Information (Signed)
Summary: [facility]  [facility]   Imported By: Sherian Rein 09/25/2009 13:05:54  _____________________________________________________________________  External Attachment:    Type:   Image     Comment:   External Document

## 2010-09-08 NOTE — Assessment & Plan Note (Signed)
Summary: rov/apc   Referred by:  Jeannetta Nap PCP:  Dr. Jeannetta Nap  Chief Complaint:  follow up visit .  History of Present Illness: Current Problems:  INSOMNIA (ICD-780.52) DYSPNEA (ICD-786.05) COPD (ICD-496) MUSCULOSKELETAL PAIN (ICD-781.99)  03/28/08- 50 year old woman returning for follow-up of COPD.  Continues to smoke 10 cigarettes a day, but asks if I would write a letter supporting disability.  Complains of multiple somatic pains.  Sister is with her today.  Had boil on her buttock drained, MRSA.  He finished antibiotic. PFT: 10/23/2007 FEV1 1.6 2/60%.  FEV1/FVC 0.53.  Lung volume showed air trapping.  Diffusion capacity moderately reduced at 58%.  Impression moderate COPD CXR: Mild COPD. Asksfor nerve pills.  Upset that I would not provide nerve pills or pain medication.  Likes cough syrup.  Says that she needs pain medication because her sister was in a motor vehicle  accident and is at [facility], which is a stress for her.  07/16/08-COPD, MSCWP C/O cough, painful feet- not changed. Dry cough. Denies infection. To see Dr Jeannetta Nap for sugar check. 1PPD still. Asks cough med but has no income and [employer] cover.        Prior Medications Reviewed Using: Patient Recall  Updated Prior Medication List: COMBIVENT 103-18 MCG/ACT  AERO (IPRATROPIUM-ALBUTEROL) as needed AMITRIPTYLINE HCL 25 MG  TABS (AMITRIPTYLINE HCL) take 1 to 2 tablets at bedtime SYMBICORT 160-4.5 MCG/ACT  AERO (BUDESONIDE-FORMOTEROL FUMARATE) 2 puffs two times a day SPIRIVA HANDIHALER 18 MCG  CAPS (TIOTROPIUM BROMIDE MONOHYDRATE) Inhale contents of 1 capsule once a day ALBUTEROL SULFATE (2.5 MG/3ML) 0.083%  NEBU (ALBUTEROL SULFATE) 1 neb four times a day as needed  Current Allergies (reviewed today): No known allergies   Past Medical History:    Reviewed history from 11/06/2007 and no changes required:       GERD with some esophageal dysmotility       Seasonal allergic rhinitis       Asthma       COPD      Tobacco use   Social History:    Reviewed history from 11/06/2007 and no changes required:       Patient is a current smoker.        Occupation Geologist, engineering",        Husband 48       DTR 17    Review of Systems      See HPI   Vital Signs:  Patient Profile:   50 Years Old Female Weight:      130 pounds O2 Sat:      100 % O2 treatment:    Room Air Pulse rate:   105 / minute BP sitting:   118 / 70  (left arm) Cuff size:   regular  Vitals Entered By: Reynaldo Minium CMA (July 16, 2008 1:48 PM)             Comments Medications reviewed with patient Reynaldo Minium CMA  July 16, 2008 1:48 PM      Physical Exam  General: A/Ox3; pleasant and cooperative, NAD, SKIN: no rash, lesions NODES: no lymphadenopathy HEENT: [place]/AT, EOM- WNL, Conjuctivae- clear, PERRLA, TM-WNL, Nose- clear, Throat- clear and wnl NECK: Supple w/ fair ROM, JVD- none, normal carotid impulses w/o bruits Thyroid- normal to palpation CHEST: Shallow, exagerated cough and sigh.No rhonchi or wheeze, poor airlow. HEART: RRR, no m/g/r heard ABDOMEN: Soft and nl; nml bowel sounds; no organomegaly or masses noted ZOX:WRUE, nl pulses, no edema  NEURO: Grossly  intact to observation         Problem # 1:  COPD (ICD-496) Refill albuterol neb solution Today give neb brovana The following medications were removed from the medication list:    Doxycycline Hyclate 100 Mg Tabs (Doxycycline hyclate) .Marland Kitchen... 2 tabs today, then 1 tab daily unitl gone  Her updated medication list for this problem includes:    Combivent 103-18 Mcg/act Aero (Ipratropium-albuterol) .Marland Kitchen... As needed    Symbicort 160-4.5 Mcg/act Aero (Budesonide-formoterol fumarate) .Marland Kitchen... 2 puffs two times a day    Spiriva Handihaler 18 Mcg Caps (Tiotropium bromide monohydrate) ..... Inhale contents of 1 capsule once a day    Albuterol Sulfate (2.5 Mg/22ml) 0.083% Nebu (Albuterol sulfate) .Marland Kitchen... 1 neb four times a day as needed   Problem # 2:   INSOMNIA (ICD-780.52) Lorazepam 0.5 mg # 30, 1 at bedtime as needed Discussed sleep hygiene, issues of dependence, misuse, tolerance and redirection, driving safety and alternatives.  Medications Added to Medication List This Visit: 1)  Lorazepam 0.5 Mg Tabs (Lorazepam) .Marland Kitchen.. 1 at bedtime as needed   Patient Instructions: 1)  Please schedule a follow-up appointment in 4 months. 2)  scripts for lorazepam and for albuterol neb solution. 3)  Neb here  [place]    Prescriptions: SYMBICORT 160-4.5 MCG/ACT  AERO (BUDESONIDE-FORMOTEROL FUMARATE) 2 puffs two times a day  #3 x 3   Entered and Authorized by:   Waymon Budge MD   Signed by:   Waymon Budge MD on 07/16/2008   Method used:   Print then Give to Patient   RxID:   [id number] ALBUTEROL SULFATE (2.5 MG/3ML) 0.083%  NEBU (ALBUTEROL SULFATE) 1 neb four times a day as needed  #100 x prn   Entered and Authorized by:   Waymon Budge MD   Signed by:   Waymon Budge MD on 07/16/2008   Method used:   Print then Give to Patient   RxID:   [id number] LORAZEPAM 0.5 MG TABS (LORAZEPAM) 1 at bedtime as needed  #30 x 1   Entered and Authorized by:   Waymon Budge MD   Signed by:   Waymon Budge MD on 07/16/2008   Method used:   Print then Give to Patient   RxID:   [id number]  ]  Medication Administration  Medication # 1:    Medication: brovana 42mcg/2ml    Diagnosis: COPD (ICD-496)    Dose: 1 vial     Route: inhaled    Exp Date: 04/2009    Lot #: [id number]    Mfr: Sepracor    Patient tolerated medication without complications    Given by: Reynaldo Minium CMA (July 16, 2008 3:19 PM)  Orders Added: 1)  Nebulizer Tx [74259] 2)  Est. Patient Level III [56387]

## 2010-09-08 NOTE — Progress Notes (Signed)
Summary: pt being d/c'd from hosp- case manager calling  Phone Note From Other Clinic   Caller: atika - case management at [facility] Call For: young Summary of Call: pt needs a HFU w/ dr young next week. call atika hall pager # [phone] asap Initial call taken by: Tivis Ringer, CNA,  October 08, 2009 11:09 AM  Follow-up for Phone Call        Please advise on a date and time. Only RN slots are open. Thanks. Asees Manfredi CMA  October 08, 2009 11:54 AM   Additional Follow-up for Phone Call Additional follow up Details #1::        Case worker aware of pt's appt on 10-17-09 at [facility] CMA  October 08, 2009 1:55 PM

## 2010-09-08 NOTE — Progress Notes (Signed)
Summary:  prednisone  Phone Note Other Incoming   Caller: [phone] Summary of Call: Calling to let us know that patient has taken all her prednisone, she's had 12 tablets, 120mg  in two days.  Requesting to speak w/ nurse. Initial call taken by: Lehman Prom,  June 01, 2010 10:40 AM  Follow-up for Phone Call        Inkerman at [facility] states that pt has taken her prednisone taper wrong. She was prescribed pred taper at discharge. She was supposed to take 30mg  x2 days, then 20 mg x 2 days, then 10mg  x 2 days. Instead pt took 30mg  twice on Saturday and Twice on Sunday. Cindy verified with pharmacy and they did dispense enough to last the pred taper. Pt is out of prednisone. Arline Asp wants to [facility] what to do. Should she start taper back at 20mg  x 2 days? Please advise.Carron Curie CMA  June 01, 2010 12:34 PM allergies: NKDA  Additional Follow-up for Phone Call Additional follow up Details #1::        Per CDY-tell patient to stop now and no additional prednisone-needs to make appt(and keep appt).Reynaldo Minium CMA  June 01, 2010 2:44 PM     Additional Follow-up for Phone Call Additional follow up Details #2::    Spoke with Arline Asp and made aware of the above recs per CDY.  She verbalized understanding and states that she will inform the pt. Follow-up by: Vernie Murders,  June 01, 2010 2:50 PM

## 2010-09-08 NOTE — Medication Information (Signed)
Summary: SPIR[facility]A pt assistance/[employer]  [facility] pt assistance/[employer]   Imported By: Lester [place] 05/08/2009 10:39:08  _____________________________________________________________________  External Attachment:    Type:   Image     Comment:   External Document  Appended Document: SPIRIVA pt assistance/[employer] Prescriptions: COMBIVENT 103-18 MCG/ACT  AERO (IPRATROPIUM-ALBUTEROL) as needed  #3 x 3   Entered and Authorized by:   Waymon Budge MD   Signed by:   Waymon Budge MD on 05/21/2009   Method used:   Print then Give to Patient   RxID:   [id number] SPIRIVA HANDIHALER 18 MCG  CAPS (TIOTROPIUM BROMIDE MONOHYDRATE) Inhale contents of 1 capsule once a day  #90 x 3   Entered and Authorized by:   Waymon Budge MD   Signed by:   Waymon Budge MD on 05/21/2009   Method used:   Print then Give to Patient   RxID:   [id number]

## 2010-09-08 NOTE — Progress Notes (Signed)
Summary: order  Phone Note Call from Patient Call back at Home Phone [phone]   Caller: Patient Call For: Megan Soto Reason for Call: Talk to Nurse Summary of Call: pt says she needs a note for homecare to help her around the house.  Her daughter is moving out, and she needs this note.  Waiting on her disability. Initial call taken by: Eugene Gavia,  April 02, 2009 3:15 PM  Follow-up for Phone Call        is this ok to put DMe ORDER in for them to eval pt?  please advise Marijo File CMA  April 02, 2009 3:31 PM   Additional Follow-up for Phone Call Additional follow up Details #1::        yes Additional Follow-up by: Waymon Budge MD,  April 03, 2009 9:08 AM    Additional Follow-up for Phone Call Additional follow up Details #2::    Order sent to [facility] for a home assessment. Called pt and she is aware that [facility] will be contacting pt. Rhonda Cobb  April 03, 2009 4:58 PM

## 2010-09-08 NOTE — Progress Notes (Signed)
Summary: fax notes asap  Phone Note Call from Patient   Caller: Patient Call For: young Summary of Call: refax ov notes from 3/30 to dr Jeannetta Nap [phone]. pt says they were not faxed yet. needs this asap to get meds.  Initial call taken by: Tivis Ringer,  November 08, 2007 12:02 PM  Follow-up for Phone Call        Refaxed note from 11-06-07 to the number that pt requested.  Spoke with her husband and made aware. Follow-up by: Vernie Murders,  November 08, 2007 12:12 PM
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
## 2010-09-10 NOTE — Progress Notes (Signed)
Summary: continue advair?  Phone Note Call from Patient Call back at Home Phone [phone]   Caller: Patient Call For: young Summary of Call: pt wants to know if she is to take advair any longer. she is out of this. Facilities manager. pt # [id number] Initial call taken by: Tivis Ringer, CNA,  August 27, 2010 11:34 AM  Follow-up for Phone Call        ATCx2 line busy . WCB. Carron Curie CMA  August 27, 2010 12:16 PM  The patient had been using Advair at one time and wants to know if she needs a refill on this medication. She says her breathing is doing fine on spiriva, albuterol in neb, combivent and  ventolin as needed. Advair is not listed on pt's med list but will ask CDY for confirmation.  Follow-up by: Michel Bickers CMA,  August 27, 2010 2:40 PM  Additional Follow-up for Phone Call Additional follow up Details #1::        Per CDY-wait on Advair rather than adding another medicine lets see how she does.Reynaldo Minium CMA  August 27, 2010 4:49 PM   Spoke with pt and notified of the above recs per CDY.  Pt verbalized understanding.  Additional Follow-up by: Vernie Murders,  August 27, 2010 4:53 PM
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
## 2010-09-10 NOTE — Progress Notes (Signed)
Summary: rx req / cough-LMTCB x 1  Phone Note Call from Patient   Caller: Patient Call For: young Summary of Call: pt requests rx for cough syrup w/ codein. says that her cough is something dr young is aware that she has "all the time". also states that her pain dr has been unavailable for 2 wks. [facility]. call pt's dad earl greeson at [phone]. pt wants this called in asap so that her dad can get this while he is out now and won't have to go back out again.  [facility] Initial call taken by: Tivis Ringer, CNA,  August 18, 2010 11:05 AM  Follow-up for Phone Call        Spoke with pt and she states that she is her cough is worse x 3 days- prod with yellow to white sputum.  She states that Dr Maple Hudson is aware of this issue already and she is requesting cough syrup with codiene- "to help with cough and pain"- since she is out of her pain meds.  Pls advise thanks NKDA Follow-up by: Vernie Murders,  August 18, 2010 11:16 AM  Additional Follow-up for Phone Call Additional follow up Details #1::        Who is her "pain doctor"- please verify unavailability and let that office know of patient's request. ( Patient is smoker with chronic bronchitis - I can give a script for cough syrup occasionally but only if ok with her pain management doctor). Additional Follow-up by: Waymon Budge MD,  August 18, 2010 1:32 PM    Additional Follow-up for Phone Call Additional follow up Details #2::    LMTCB  Vernie Murders  August 18, 2010 1:52 PM  Spoke with pt.  She advised that her pain Dr is Dr. Erlinda Hong.  She states that she has been calling his office all morning and is unable to reach him.  She is still requesting codiene cough med.  I advised that we will have to check with Dr Thurmond Butts and be sure it' okay with him.  I looked up this "pain dc" and come to find out, [facility].  I called the number listed for him and could not get an answer.  Pls advise  thanks Follow-up by: Vernie Murders,  August 19, 2010 12:34 PM  Additional Follow-up for Phone Call Additional follow up Details #3:: Details for Additional Follow-up Action Taken: Thanks for doing the legwork on that. OK to script phenergan codeine cough syrup, 200 ml, ref x 1     1 teaspoon four times a day as needed cough Tell her this intended for occasional use only and I won't be giving frequent refills so she needs to make it last.     Additional Follow-up by: Waymon Budge MD,  August 19, 2010 4:52 PM  New/Updated Medications: PROMETHAZINE-CODEINE 6.25-10 MG/5ML SYRP (PROMETHAZINE-CODEINE) 1 teaspoon four times a day as needed cough Prescriptions: PROMETHAZINE-CODEINE 6.25-10 MG/5ML SYRP (PROMETHAZINE-CODEINE) 1 teaspoon four times a day as needed cough  #266ml x 1   Entered by:   Reynaldo Minium CMA   Authorized by:   Waymon Budge MD   Signed by:   Reynaldo Minium CMA on 08/19/2010   Method used:   Telephoned to ...       [facility]* (retail)       [address]       [place], [place]  [address]  Ph: [phone]       Fax: [phone]   RxID:   [id number]  Pt is aware of Rx and recs from CDY.Reynaldo Minium CMA  August 19, 2010 5:03 PM

## 2010-09-10 NOTE — Progress Notes (Signed)
Summary: needs o2 order  Phone Note Call from Patient Call back at Home Phone [phone]   Caller: Patient Call For: young Summary of Call: pt was told to call back to let you know where she got her oxygen from pt gets it [facility] ? [facility] per the papers  Initial call taken by: Lacinda Axon,  July 28, 2010 12:23 PM  Follow-up for Phone Call        Per pt instructions from yesterday, Please call the office with the name ot the oxygen suply company so we can have them add a humidifier to yur concentrator.   Called, spoke with pt.  States DME is [facility].  She is requesting order for humidifer and protable o2.  Dr. Maple Hudson, pls advise if both are ok.  Thanks! Follow-up by: Gweneth Dimitri RN,  July 28, 2010 2:37 PM  Additional Follow-up for Phone Call Additional follow up Details #1::        We will add orders.  Additional Follow-up by: Waymon Budge MD,  July 28, 2010 8:21 PM    Additional Follow-up for Phone Call Additional follow up Details #2::    order faxed to [facility] to get more portable 02 and humidifer for 02 Follow-up by: Oneita Jolly,  July 29, 2010 9:25 AM  New/Updated Medications: * OXYGEN 2 L/M CONT AND PORT [facility]/ [facility]

## 2010-09-10 NOTE — Progress Notes (Signed)
  Phone Note Other Incoming   Request: Send information Summary of Call: Request for records received from [facility]. Request forwarded to [employer].  Sept 20, 2011 to present.

## 2010-09-11 NOTE — Letter (Signed)
Summary: [employer] Neb[employer]/[facility]  [facility] [facility]   Imported By: Lester [place] 10/30/2008 10:07:43  _____________________________________________________________________  External Attachment:    Type:   Image     Comment:   External Document

## 2010-09-16 NOTE — Progress Notes (Signed)
Summary: change medications  Phone Note Call from Patient Call back at [phone]   Caller: Patient Call For: young Summary of Call: Pt wants to know if she can switch form diazepam 10mg  to xanax re: she has hepatitis C pls advise.//[facility] Initial call taken by: Darletta Moll,  September 08, 2010 10:24 AM  Follow-up for Phone Call        Pt states she was recently diagnosed with Hepatitis C and her doctor told her she should switch from diazepam to xanax because xanax is "easier" on her liver. Please advsie.Carron Curie CMA  September 08, 2010 12:43 PM   Additional Follow-up for Phone Call Additional follow up Details #1::        I don't know about this difference, bu it would be fine for THAT doctor to change her med.        Additional Follow-up by: Waymon Budge MD,  September 08, 2010 1:06 PM  New Problems: HEPATITIS C (ICD-070.51)   Additional Follow-up for Phone Call Additional follow up Details #2::    Spoke with pt and notified of the above recs per CDY.  Pt verbalized understanding.  Follow-up by: Vernie Murders,  September 08, 2010 1:09 PM  New Problems: HEPATITIS C (ICD-070.51)

## 2010-10-21 LAB — CARDIAC PANEL(CRET KIN+CKTOT+MB+TROPI)
CK, MB: 2.2 ng/mL (ref 0.3–4.0)
CK, MB: 2.2 ng/mL (ref 0.3–4.0)
CK, MB: 2.6 ng/mL (ref 0.3–4.0)
Relative Index: 1.8 (ref 0.0–2.5)
Relative Index: 1.9 (ref 0.0–2.5)
Relative Index: 2.1 (ref 0.0–2.5)
Total CK: 106 U/L (ref 7–177)
Total CK: 113 U/L (ref 7–177)
Total CK: 143 U/L (ref 7–177)
Troponin I: 0.01 ng/mL (ref 0.00–0.06)
Troponin I: 0.02 ng/mL (ref 0.00–0.06)
Troponin I: 0.02 ng/mL (ref 0.00–0.06)

## 2010-10-21 LAB — HEMOGLOBIN A1C
Hgb A1c MFr Bld: 6 % — ABNORMAL HIGH (ref <5.7)
Mean Plasma Glucose: 126 mg/dL — ABNORMAL HIGH (ref <117)

## 2010-10-21 LAB — BLOOD GAS, ARTERIAL
Acid-base deficit: 0.3 mmol/L (ref 0.0–2.0)
Bicarbonate: 25.3 mEq/L — ABNORMAL HIGH (ref 20.0–24.0)
Drawn by: 331471
O2 Saturation: 88.2 %
Patient temperature: 98.6
TCO2: 23.1 mmol/L (ref 0–100)
pCO2 arterial: 47.9 mmHg — ABNORMAL HIGH (ref 35.0–45.0)
pH, Arterial: 7.343 — ABNORMAL LOW (ref 7.350–7.400)
pO2, Arterial: 57.2 mmHg — ABNORMAL LOW (ref 80.0–100.0)

## 2010-10-21 LAB — COMPREHENSIVE METABOLIC PANEL
ALT: 82 U/L — ABNORMAL HIGH (ref 0–35)
ALT: 86 U/L — ABNORMAL HIGH (ref 0–35)
ALT: 95 U/L — ABNORMAL HIGH (ref 0–35)
AST: 109 U/L — ABNORMAL HIGH (ref 0–37)
AST: 82 U/L — ABNORMAL HIGH (ref 0–37)
AST: 83 U/L — ABNORMAL HIGH (ref 0–37)
Albumin: 3.7 g/dL (ref 3.5–5.2)
Albumin: 3.8 g/dL (ref 3.5–5.2)
Albumin: 3.8 g/dL (ref 3.5–5.2)
Alkaline Phosphatase: 103 U/L (ref 39–117)
Alkaline Phosphatase: 103 U/L (ref 39–117)
Alkaline Phosphatase: 105 U/L (ref 39–117)
BUN: 6 mg/dL (ref 6–23)
BUN: 7 mg/dL (ref 6–23)
BUN: 7 mg/dL (ref 6–23)
CO2: 27 mEq/L (ref 19–32)
CO2: 28 mEq/L (ref 19–32)
CO2: 33 mEq/L — ABNORMAL HIGH (ref 19–32)
Calcium: 8.9 mg/dL (ref 8.4–10.5)
Calcium: 9.2 mg/dL (ref 8.4–10.5)
Calcium: 9.4 mg/dL (ref 8.4–10.5)
Chloride: 100 mEq/L (ref 96–112)
Chloride: 102 mEq/L (ref 96–112)
Chloride: 103 mEq/L (ref 96–112)
Creatinine, Ser: 0.79 mg/dL (ref 0.4–1.2)
Creatinine, Ser: 0.81 mg/dL (ref 0.4–1.2)
Creatinine, Ser: 0.82 mg/dL (ref 0.4–1.2)
GFR calc Af Amer: >60 mL/min (ref >60)
GFR calc Af Amer: >60 mL/min (ref >60)
GFR calc Af Amer: >60 mL/min (ref >60)
GFR calc non Af Amer: >60 mL/min (ref >60)
GFR calc non Af Amer: >60 mL/min (ref >60)
GFR calc non Af Amer: >60 mL/min (ref >60)
Glucose, Bld: 122 mg/dL — ABNORMAL HIGH (ref 70–99)
Glucose, Bld: 135 mg/dL — ABNORMAL HIGH (ref 70–99)
Glucose, Bld: 139 mg/dL — ABNORMAL HIGH (ref 70–99)
Potassium: 4.1 mEq/L (ref 3.5–5.1)
Potassium: 4.6 mEq/L (ref 3.5–5.1)
Potassium: 4.6 mEq/L (ref 3.5–5.1)
Sodium: 137 mEq/L (ref 135–145)
Sodium: 138 mEq/L (ref 135–145)
Sodium: 139 mEq/L (ref 135–145)
Total Bilirubin: 0.5 mg/dL (ref 0.3–1.2)
Total Bilirubin: 0.6 mg/dL (ref 0.3–1.2)
Total Bilirubin: 0.7 mg/dL (ref 0.3–1.2)
Total Protein: 6.9 g/dL (ref 6.0–8.3)
Total Protein: 7.1 g/dL (ref 6.0–8.3)
Total Protein: 7.3 g/dL (ref 6.0–8.3)

## 2010-10-21 LAB — CBC
HCT: 37.3 % (ref 36.0–46.0)
HCT: 39 % (ref 36.0–46.0)
HCT: 39.2 % (ref 36.0–46.0)
Hemoglobin: 12.7 g/dL (ref 12.0–15.0)
Hemoglobin: 13.3 g/dL (ref 12.0–15.0)
Hemoglobin: 13.4 g/dL (ref 12.0–15.0)
MCH: 32.4 pg (ref 26.0–34.0)
MCH: 32.6 pg (ref 26.0–34.0)
MCH: 32.7 pg (ref 26.0–34.0)
MCHC: 34 g/dL (ref 30.0–36.0)
MCHC: 34.1 g/dL (ref 30.0–36.0)
MCHC: 34.4 g/dL (ref 30.0–36.0)
MCV: 94.2 fL (ref 78.0–100.0)
MCV: 95.8 fL (ref 78.0–100.0)
MCV: 96 fL (ref 78.0–100.0)
Platelets: 166 10*3/uL (ref 150–400)
Platelets: 188 10*3/uL (ref 150–400)
Platelets: 246 10*3/uL (ref 150–400)
RBC: 3.89 MIL/uL (ref 3.87–5.11)
RBC: 4.08 MIL/uL (ref 3.87–5.11)
RBC: 4.14 MIL/uL (ref 3.87–5.11)
RDW: 13.8 % (ref 11.5–15.5)
RDW: 13.8 % (ref 11.5–15.5)
RDW: 14.4 % (ref 11.5–15.5)
WBC: 11 10*3/uL — ABNORMAL HIGH (ref 4.0–10.5)
WBC: 6.8 10*3/uL (ref 4.0–10.5)
WBC: 6.8 10*3/uL (ref 4.0–10.5)

## 2010-10-21 LAB — URINALYSIS, ROUTINE W REFLEX MICROSCOPIC
Bilirubin Urine: NEGATIVE
Glucose, UA: NEGATIVE mg/dL
Hgb urine dipstick: NEGATIVE
Ketones, ur: NEGATIVE mg/dL
Nitrite: NEGATIVE
Protein, ur: NEGATIVE mg/dL
Specific Gravity, Urine: 1.016 (ref 1.005–1.030)
Urobilinogen, UA: 0.2 mg/dL (ref 0.0–1.0)
pH: 5.5 (ref 5.0–8.0)

## 2010-10-21 LAB — GLUCOSE, CAPILLARY
Glucose-Capillary: 114 mg/dL — ABNORMAL HIGH (ref 70–99)
Glucose-Capillary: 116 mg/dL — ABNORMAL HIGH (ref 70–99)
Glucose-Capillary: 121 mg/dL — ABNORMAL HIGH (ref 70–99)
Glucose-Capillary: 132 mg/dL — ABNORMAL HIGH (ref 70–99)
Glucose-Capillary: 133 mg/dL — ABNORMAL HIGH (ref 70–99)
Glucose-Capillary: 138 mg/dL — ABNORMAL HIGH (ref 70–99)
Glucose-Capillary: 146 mg/dL — ABNORMAL HIGH (ref 70–99)
Glucose-Capillary: 169 mg/dL — ABNORMAL HIGH (ref 70–99)
Glucose-Capillary: 185 mg/dL — ABNORMAL HIGH (ref 70–99)
Glucose-Capillary: 195 mg/dL — ABNORMAL HIGH (ref 70–99)
Glucose-Capillary: 232 mg/dL — ABNORMAL HIGH (ref 70–99)

## 2010-10-21 LAB — LIPID PANEL
Cholesterol: 145 mg/dL (ref 0–200)
HDL: 51 mg/dL (ref >39)
LDL Cholesterol: 27 mg/dL (ref 0–99)
Total CHOL/HDL Ratio: 2.8 RATIO
Triglycerides: 336 mg/dL — ABNORMAL HIGH (ref <150)
VLDL: 67 mg/dL — ABNORMAL HIGH (ref 0–40)

## 2010-10-21 LAB — DIFFERENTIAL
Basophils Absolute: 0.1 10*3/uL (ref 0.0–0.1)
Basophils Relative: 1 % (ref 0–1)
Eosinophils Absolute: 0.1 10*3/uL (ref 0.0–0.7)
Eosinophils Relative: 1 % (ref 0–5)
Lymphocytes Relative: 37 % (ref 12–46)
Lymphs Abs: 2.5 10*3/uL (ref 0.7–4.0)
Monocytes Absolute: 0.9 10*3/uL (ref 0.1–1.0)
Monocytes Relative: 13 % — ABNORMAL HIGH (ref 3–12)
Neutro Abs: 3.2 10*3/uL (ref 1.7–7.7)
Neutrophils Relative %: 48 % (ref 43–77)

## 2010-10-21 LAB — RAPID URINE DRUG SCREEN, HOSP PERFORMED
Amphetamines: NOT DETECTED
Barbiturates: NOT DETECTED
Benzodiazepines: POSITIVE — AB
Cocaine: NOT DETECTED
Opiates: POSITIVE — AB
Tetrahydrocannabinol: NOT DETECTED

## 2010-10-21 LAB — ETHANOL: Alcohol, Ethyl (B): <5 mg/dL (ref 0–10)

## 2010-10-21 LAB — HEPATITIS PANEL, ACUTE
HCV Ab: REACTIVE — AB
Hep A IgM: NEGATIVE
Hep B C IgM: NEGATIVE
Hepatitis B Surface Ag: NEGATIVE

## 2010-10-21 LAB — POCT CARDIAC MARKERS
CKMB, poc: <1 ng/mL — ABNORMAL LOW (ref 1.0–8.0)
Myoglobin, poc: 31.4 ng/mL (ref 12–200)
Troponin i, poc: <0.05 ng/mL (ref 0.00–0.09)

## 2010-10-24 LAB — DIFFERENTIAL
Basophils Absolute: 0 10*3/uL (ref 0.0–0.1)
Basophils Relative: 1 % (ref 0–1)
Eosinophils Absolute: 0 10*3/uL (ref 0.0–0.7)
Eosinophils Relative: 0 % (ref 0–5)
Lymphocytes Relative: 17 % (ref 12–46)
Lymphs Abs: 1.1 10*3/uL (ref 0.7–4.0)
Monocytes Absolute: 0.6 10*3/uL (ref 0.1–1.0)
Monocytes Relative: 9 % (ref 3–12)
Neutro Abs: 4.7 10*3/uL (ref 1.7–7.7)
Neutrophils Relative %: 73 % (ref 43–77)

## 2010-10-24 LAB — POCT I-STAT 3, ART BLOOD GAS (G3+)
Bicarbonate: 25 mEq/L — ABNORMAL HIGH (ref 20.0–24.0)
O2 Saturation: 97 %
Patient temperature: 98.6
TCO2: 26 mmol/L (ref 0–100)
pCO2 arterial: 42.9 mmHg (ref 35.0–45.0)
pH, Arterial: 7.373 (ref 7.350–7.400)
pO2, Arterial: 92 mmHg (ref 80.0–100.0)

## 2010-10-24 LAB — CBC
HCT: 35.4 % — ABNORMAL LOW (ref 36.0–46.0)
Hemoglobin: 12 g/dL (ref 12.0–15.0)
MCH: 31.9 pg (ref 26.0–34.0)
MCHC: 33.8 g/dL (ref 30.0–36.0)
MCV: 94.4 fL (ref 78.0–100.0)
Platelets: 160 10*3/uL (ref 150–400)
RBC: 3.76 MIL/uL — ABNORMAL LOW (ref 3.87–5.11)
RDW: 15.1 % (ref 11.5–15.5)
WBC: 6.4 10*3/uL (ref 4.0–10.5)

## 2010-10-24 LAB — BASIC METABOLIC PANEL
BUN: 5 mg/dL — ABNORMAL LOW (ref 6–23)
CO2: 21 mEq/L (ref 19–32)
Calcium: 8.2 mg/dL — ABNORMAL LOW (ref 8.4–10.5)
Chloride: 107 mEq/L (ref 96–112)
Creatinine, Ser: 0.88 mg/dL (ref 0.4–1.2)
GFR calc Af Amer: >60 mL/min (ref >60)
GFR calc non Af Amer: >60 mL/min (ref >60)
Glucose, Bld: 342 mg/dL — ABNORMAL HIGH (ref 70–99)
Potassium: 4.2 mEq/L (ref 3.5–5.1)
Sodium: 136 mEq/L (ref 135–145)

## 2010-10-24 LAB — POCT CARDIAC MARKERS
CKMB, poc: 1.5 ng/mL (ref 1.0–8.0)
CKMB, poc: 2 ng/mL (ref 1.0–8.0)
CKMB, poc: 2.1 ng/mL (ref 1.0–8.0)
Myoglobin, poc: 44.7 ng/mL (ref 12–200)
Myoglobin, poc: 55.5 ng/mL (ref 12–200)
Myoglobin, poc: 59.9 ng/mL (ref 12–200)
Troponin i, poc: <0.05 ng/mL (ref 0.00–0.09)
Troponin i, poc: <0.05 ng/mL (ref 0.00–0.09)
Troponin i, poc: <0.05 ng/mL (ref 0.00–0.09)

## 2010-10-25 LAB — GLUCOSE, CAPILLARY
Glucose-Capillary: 135 mg/dL — ABNORMAL HIGH (ref 70–99)
Glucose-Capillary: 154 mg/dL — ABNORMAL HIGH (ref 70–99)
Glucose-Capillary: 174 mg/dL — ABNORMAL HIGH (ref 70–99)
Glucose-Capillary: 179 mg/dL — ABNORMAL HIGH (ref 70–99)
Glucose-Capillary: 199 mg/dL — ABNORMAL HIGH (ref 70–99)
Glucose-Capillary: 220 mg/dL — ABNORMAL HIGH (ref 70–99)
Glucose-Capillary: 231 mg/dL — ABNORMAL HIGH (ref 70–99)
Glucose-Capillary: 93 mg/dL (ref 70–99)
Glucose-Capillary: 235 mg/dL — ABNORMAL HIGH (ref 70–99)
Glucose-Capillary: 366 mg/dL — ABNORMAL HIGH (ref 70–99)

## 2010-10-25 LAB — TSH: TSH: 0.641 u[IU]/mL (ref 0.350–4.500)

## 2010-10-25 LAB — POCT I-STAT, CHEM 8
BUN: 9 mg/dL (ref 6–23)
Calcium, Ion: 1.17 mmol/L (ref 1.12–1.32)
Chloride: 104 mEq/L (ref 96–112)
Creatinine, Ser: 0.7 mg/dL (ref 0.4–1.2)
Glucose, Bld: 232 mg/dL — ABNORMAL HIGH (ref 70–99)
HCT: 46 % (ref 36.0–46.0)
Hemoglobin: 15.6 g/dL — ABNORMAL HIGH (ref 12.0–15.0)
Potassium: 3.8 mEq/L (ref 3.5–5.1)
Sodium: 138 mEq/L (ref 135–145)
TCO2: 21 mmol/L (ref 0–100)

## 2010-10-25 LAB — COMPREHENSIVE METABOLIC PANEL
ALT: 88 U/L — ABNORMAL HIGH (ref 0–35)
AST: 44 U/L — ABNORMAL HIGH (ref 0–37)
Albumin: 3.9 g/dL (ref 3.5–5.2)
Alkaline Phosphatase: 116 U/L (ref 39–117)
BUN: 10 mg/dL (ref 6–23)
CO2: 24 mEq/L (ref 19–32)
Calcium: 8.8 mg/dL (ref 8.4–10.5)
Chloride: 103 mEq/L (ref 96–112)
Creatinine, Ser: 1.02 mg/dL (ref 0.4–1.2)
GFR calc Af Amer: >60 mL/min (ref >60)
GFR calc non Af Amer: 57 mL/min — ABNORMAL LOW (ref >60)
Glucose, Bld: 241 mg/dL — ABNORMAL HIGH (ref 70–99)
Potassium: 4 mEq/L (ref 3.5–5.1)
Sodium: 136 mEq/L (ref 135–145)
Total Bilirubin: 0.5 mg/dL (ref 0.3–1.2)
Total Protein: 7.1 g/dL (ref 6.0–8.3)

## 2010-10-25 LAB — CARDIAC PANEL(CRET KIN+CKTOT+MB+TROPI)
CK, MB: 2.1 ng/mL (ref 0.3–4.0)
CK, MB: 1.9 ng/mL (ref 0.3–4.0)
Relative Index: INVALID (ref 0.0–2.5)
Relative Index: INVALID (ref 0.0–2.5)
Total CK: 78 U/L (ref 7–177)
Total CK: 91 U/L (ref 7–177)
Troponin I: 0.01 ng/mL (ref 0.00–0.06)
Troponin I: 0.02 ng/mL (ref 0.00–0.06)

## 2010-10-25 LAB — CK TOTAL AND CKMB (NOT AT ARMC)
CK, MB: 1.7 ng/mL (ref 0.3–4.0)
Relative Index: INVALID (ref 0.0–2.5)
Total CK: 76 U/L (ref 7–177)

## 2010-10-25 LAB — DIFFERENTIAL
Basophils Absolute: 0 10*3/uL (ref 0.0–0.1)
Basophils Relative: 0 % (ref 0–1)
Eosinophils Absolute: 0 10*3/uL (ref 0.0–0.7)
Eosinophils Relative: 0 % (ref 0–5)
Lymphocytes Relative: 8 % — ABNORMAL LOW (ref 12–46)
Lymphs Abs: 1.1 10*3/uL (ref 0.7–4.0)
Monocytes Absolute: 0.5 10*3/uL (ref 0.1–1.0)
Monocytes Relative: 4 % (ref 3–12)
Neutro Abs: 12.5 10*3/uL — ABNORMAL HIGH (ref 1.7–7.7)
Neutrophils Relative %: 88 % — ABNORMAL HIGH (ref 43–77)

## 2010-10-25 LAB — CBC
HCT: 38.7 % (ref 36.0–46.0)
HCT: 41.3 % (ref 36.0–46.0)
Hemoglobin: 13.3 g/dL (ref 12.0–15.0)
Hemoglobin: 14.1 g/dL (ref 12.0–15.0)
MCH: 32.2 pg (ref 26.0–34.0)
MCH: 32.1 pg (ref 26.0–34.0)
MCHC: 34.3 g/dL (ref 30.0–36.0)
MCHC: 34 g/dL (ref 30.0–36.0)
MCV: 93.9 fL (ref 78.0–100.0)
MCV: 94.2 fL (ref 78.0–100.0)
Platelets: 181 10*3/uL (ref 150–400)
Platelets: 192 10*3/uL (ref 150–400)
RBC: 4.12 MIL/uL (ref 3.87–5.11)
RBC: 4.39 MIL/uL (ref 3.87–5.11)
RDW: 14.4 % (ref 11.5–15.5)
RDW: 14 % (ref 11.5–15.5)
WBC: 15 10*3/uL — ABNORMAL HIGH (ref 4.0–10.5)
WBC: 14.1 10*3/uL — ABNORMAL HIGH (ref 4.0–10.5)

## 2010-10-25 LAB — URINALYSIS, ROUTINE W REFLEX MICROSCOPIC
Bilirubin Urine: NEGATIVE
Glucose, UA: 250 mg/dL — AB
Hgb urine dipstick: NEGATIVE
Ketones, ur: NEGATIVE mg/dL
Nitrite: NEGATIVE
Protein, ur: NEGATIVE mg/dL
Specific Gravity, Urine: 1.005 (ref 1.005–1.030)
Urobilinogen, UA: 0.2 mg/dL (ref 0.0–1.0)
pH: 5.5 (ref 5.0–8.0)

## 2010-10-25 LAB — BASIC METABOLIC PANEL
BUN: 10 mg/dL (ref 6–23)
CO2: 27 mEq/L (ref 19–32)
Calcium: 9.1 mg/dL (ref 8.4–10.5)
Chloride: 104 mEq/L (ref 96–112)
Creatinine, Ser: 0.89 mg/dL (ref 0.4–1.2)
GFR calc Af Amer: >60 mL/min (ref >60)
GFR calc non Af Amer: >60 mL/min (ref >60)
Glucose, Bld: 257 mg/dL — ABNORMAL HIGH (ref 70–99)
Potassium: 4.3 mEq/L (ref 3.5–5.1)
Sodium: 138 mEq/L (ref 135–145)

## 2010-10-25 LAB — POCT CARDIAC MARKERS
CKMB, poc: <1 ng/mL — ABNORMAL LOW (ref 1.0–8.0)
Myoglobin, poc: 45.2 ng/mL (ref 12–200)
Troponin i, poc: <0.05 ng/mL (ref 0.00–0.09)

## 2010-10-25 LAB — HEMOGLOBIN A1C
Hgb A1c MFr Bld: 6.1 % — ABNORMAL HIGH (ref <5.7)
Mean Plasma Glucose: 128 mg/dL — ABNORMAL HIGH (ref <117)

## 2010-10-25 LAB — TROPONIN I: Troponin I: 0.03 ng/mL (ref 0.00–0.06)

## 2010-10-27 ENCOUNTER — Ambulatory Visit (INDEPENDENT_AMBULATORY_CARE_PROVIDER_SITE_OTHER): Payer: Medicaid Other | Admitting: Internal Medicine

## 2010-10-27 ENCOUNTER — Encounter: Payer: Self-pay | Admitting: Internal Medicine

## 2010-10-27 VITALS — BP 124/72 | HR 121 | Ht 62.0 in | Wt 126.4 lb

## 2010-10-27 DIAGNOSIS — F411 Generalized anxiety disorder: Secondary | ICD-10-CM

## 2010-10-27 DIAGNOSIS — F17200 Nicotine dependence, unspecified, uncomplicated: Secondary | ICD-10-CM

## 2010-10-27 DIAGNOSIS — J449 Chronic obstructive pulmonary disease, unspecified: Secondary | ICD-10-CM

## 2010-10-27 DIAGNOSIS — R29818 Other symptoms and signs involving the nervous system: Secondary | ICD-10-CM

## 2010-10-27 MED ORDER — PROMETHAZINE-CODEINE 6.25-10 MG/5ML PO SYRP: 5.0000 mL | ORAL_SOLUTION | Freq: Four times a day (QID) | ORAL | Status: DC | PRN

## 2010-10-27 MED ORDER — METHYLPREDNISOLONE ACETATE 80 MG/ML IJ SUSP
80.0000 mg | Freq: Once | INTRAMUSCULAR | Status: AC
  Administered 2010-10-27: 80 mg via INTRAMUSCULAR

## 2010-10-27 NOTE — Patient Instructions (Addendum)
depomedrol 80 mg IM given  O2 sat on room air was: 92% today at rest. You can sit breathing room air, but I would wear it with activity and with sleep.   Continue your oxygen. When you come, you can ask to be switched from your oxygen to ours while here, so yours can last.  Continue your regular meds.   Form for transportation  Refill script for cough syrup

## 2010-10-27 NOTE — Assessment & Plan Note (Addendum)
I continue to counsel at each visit. She always seems stressed and unable to focus on this as a long term goal and necessity. i have repeatdely offered her available support measuers and referrals, which have not been followed up.

## 2010-10-27 NOTE — Progress Notes (Signed)
  Subjective:    Patient ID: Megan Soto, female    DOB: 1961-08-05, 50 y.o.   MRN: [id number]  HPI 50yoF here with daughter, for f/u of COPD, Chronic bronchitis, tobacco abuse, insomnia and anxiety. Last here 07/27/10. Did have a chest cold 2 weeks ago with green mucus which cleared w/o seeking treatment except Nyquil,  but no major events otherwise through the winter. Cough is productive of thick mucus that chokes her- clear, no blood. She still smokes 1 PPD and says she will try harder. I strongly encouraged a real decision on her part to stop tobacco use. She asks help for joint pain and stiffness.Elbows and hips hurt. Muscle cramps she blames on work of labored breathing.   Last prednisone was at least 4-5 months ago. Home health nurse comes twice weekly.   Review of Systems Constitutional:   No weight loss, Yes- night sweats,No-   Fevers, chills, fatigue, lassitude. HEENT:   No headaches,  Difficulty swallowing,  Tooth/dental problems,  Sore throat,                No sneezing, itching, ear ache, nasal congestion, post nasal drip,   CV:  No chest pain,  Orthopnea, PND, swelling in lower extremities, anasarca, dizziness, palpitations  GI  No heartburn, indigestion, abdominal pain, nausea, vomiting, diarrhea, change in bowel habits, loss of appetite  Resp:Routinely short of breath at rest and exertion.  Cough is productive,  No coughing up of blood.  No change in color of mucus.  No wheezing.  No chest wall deformity  Skin: no rash or lesions.  GU: no dysuria, change in color of urine, no urgency or frequency.  No flank pain.  MS:  Much complaint of arthritis pain.  No decreased range of motion.  No back pain.  Psych:  No change in mood or affect.Always anxious and depressed.  No memory loss.       Objective:   Physical Exam    General- Alert, Oriented, Affect-anxious, dramatic, Distress-mild persistent. Supplemental O2 at 2 L/M, 100% sat  Skin- rash-none, lesions- none,  excoriation- none  Lymphadenopathy- none  Head- atraumatic  Eyes- Gross vision intact, PERRLA, conjunctivae clear, secretions  Ears- Hearing- ok canals-some cerumen, Tm- cerumen obscures w/o blocking ,  Nose- Clear, No- Septal dev, mucus, polyps, erosion, perforation   Throat- Mallampati II , mucosa clear , drainage- none, tonsils- atrophic  Neck- flexible , trachea midline, no stridor , thyroid nl, carotid no bruit  Chest - symmetrical excursion , uexagerated, nonphysiologic gasping and moaning     Heart/CV- RRR , no murmur , no gallop  , no rub, nl s1 s2                     - JVD- none , edema- none, stasis changes- none, varices- none     Lung- Dininished, wheeze- bilateral, cough- mild , dullness-none, rub- none     Chest wall- atraumatic, no scar  Abd- tender-no, distended-no, bowel sounds-present, HSM- no  Br/ Gen/ Rectal- Not done, not indicated  Extrem- cyanosis- none, clubbing, none, atrophy- none, strength- nl  Neuro- grossly intact to observation     Assessment & Plan:

## 2010-10-27 NOTE — Assessment & Plan Note (Signed)
She complains of large joint, symmetrical arthralgias without associated myalgias, heat , swelling or crepitus. The depomedrol may help.

## 2010-10-27 NOTE — Assessment & Plan Note (Addendum)
Chronic bronchitis with postviral exacerbation. I doubt active infection now and will give depomedrol. In the past she had miscounted and been unable to follow instructions for prednisone taper.  She is asking refill cough syrup, saying she last has a script for this 2 months ago.  Note that room air oximetry today was 9% as she sat in exam room after running out of her own tank 5 minutes earlier.

## 2010-10-27 NOTE — Assessment & Plan Note (Addendum)
She remains dramatic, anxious and at most visits she is concerned about access to meds. 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 She will tend to over/ misuse meds if given the opportunty.

## 2010-10-28 LAB — BLOOD GAS, ARTERIAL
Acid-base deficit: 0 mmol/L (ref 0.0–2.0)
Bicarbonate: 23.1 mEq/L (ref 20.0–24.0)
Drawn by: 145321
FIO2: 0.21 %
O2 Saturation: 94.5 %
Patient temperature: 98.6
TCO2: 20.6 mmol/L (ref 0–100)
pCO2 arterial: 34.4 mmHg — ABNORMAL LOW (ref 35.0–45.0)
pH, Arterial: 7.443 — ABNORMAL HIGH (ref 7.350–7.400)
pO2, Arterial: 71.9 mmHg — ABNORMAL LOW (ref 80.0–100.0)

## 2010-10-28 LAB — DIFFERENTIAL
Basophils Absolute: 0 10*3/uL (ref 0.0–0.1)
Basophils Relative: 0 % (ref 0–1)
Eosinophils Absolute: 0 10*3/uL (ref 0.0–0.7)
Eosinophils Relative: 0 % (ref 0–5)
Lymphocytes Relative: 19 % (ref 12–46)
Lymphs Abs: 1.6 10*3/uL (ref 0.7–4.0)
Monocytes Absolute: 0.6 10*3/uL (ref 0.1–1.0)
Monocytes Relative: 7 % (ref 3–12)
Neutro Abs: 6.3 10*3/uL (ref 1.7–7.7)
Neutrophils Relative %: 74 % (ref 43–77)

## 2010-10-28 LAB — CBC
HCT: 39 % (ref 36.0–46.0)
Hemoglobin: 13.6 g/dL (ref 12.0–15.0)
MCHC: 35 g/dL (ref 30.0–36.0)
MCV: 95 fL (ref 78.0–100.0)
Platelets: 138 10*3/uL — ABNORMAL LOW (ref 150–400)
RBC: 4.1 MIL/uL (ref 3.87–5.11)
RDW: 14.5 % (ref 11.5–15.5)
WBC: 8.6 10*3/uL (ref 4.0–10.5)

## 2010-10-28 LAB — BASIC METABOLIC PANEL
BUN: 6 mg/dL (ref 6–23)
CO2: 23 mEq/L (ref 19–32)
Calcium: 8.3 mg/dL — ABNORMAL LOW (ref 8.4–10.5)
Chloride: 107 mEq/L (ref 96–112)
Creatinine, Ser: 0.62 mg/dL (ref 0.4–1.2)
GFR calc Af Amer: >60 mL/min (ref >60)
GFR calc non Af Amer: >60 mL/min (ref >60)
Glucose, Bld: 135 mg/dL — ABNORMAL HIGH (ref 70–99)
Potassium: 3.9 mEq/L (ref 3.5–5.1)
Sodium: 136 mEq/L (ref 135–145)

## 2010-10-28 LAB — D-DIMER, QUANTITATIVE (NOT AT ARMC): D-Dimer, Quant: 3.71 ug/mL-FEU — ABNORMAL HIGH (ref 0.00–0.48)

## 2010-11-02 LAB — GLUCOSE, CAPILLARY
Glucose-Capillary: 127 mg/dL — ABNORMAL HIGH (ref 70–99)
Glucose-Capillary: 135 mg/dL — ABNORMAL HIGH (ref 70–99)
Glucose-Capillary: 178 mg/dL — ABNORMAL HIGH (ref 70–99)
Glucose-Capillary: 204 mg/dL — ABNORMAL HIGH (ref 70–99)
Glucose-Capillary: 221 mg/dL — ABNORMAL HIGH (ref 70–99)
Glucose-Capillary: 225 mg/dL — ABNORMAL HIGH (ref 70–99)
Glucose-Capillary: 233 mg/dL — ABNORMAL HIGH (ref 70–99)
Glucose-Capillary: 242 mg/dL — ABNORMAL HIGH (ref 70–99)
Glucose-Capillary: 250 mg/dL — ABNORMAL HIGH (ref 70–99)
Glucose-Capillary: 252 mg/dL — ABNORMAL HIGH (ref 70–99)

## 2010-11-13 LAB — CBC
HCT: 37.8 % (ref 36.0–46.0)
Hemoglobin: 13.3 g/dL (ref 12.0–15.0)
MCHC: 35.2 g/dL (ref 30.0–36.0)
MCV: 93.8 fL (ref 78.0–100.0)
Platelets: 159 10*3/uL (ref 150–400)
RBC: 4.02 MIL/uL (ref 3.87–5.11)
RDW: 13.4 % (ref 11.5–15.5)
WBC: 6.7 10*3/uL (ref 4.0–10.5)

## 2010-11-13 LAB — DIFFERENTIAL
Basophils Absolute: 0 10*3/uL (ref 0.0–0.1)
Basophils Relative: 1 % (ref 0–1)
Eosinophils Absolute: 0.1 10*3/uL (ref 0.0–0.7)
Eosinophils Relative: 2 % (ref 0–5)
Lymphocytes Relative: 31 % (ref 12–46)
Lymphs Abs: 2.1 10*3/uL (ref 0.7–4.0)
Monocytes Absolute: 0.5 10*3/uL (ref 0.1–1.0)
Monocytes Relative: 7 % (ref 3–12)
Neutro Abs: 4 10*3/uL (ref 1.7–7.7)
Neutrophils Relative %: 59 % (ref 43–77)

## 2010-11-13 LAB — BASIC METABOLIC PANEL
BUN: 5 mg/dL — ABNORMAL LOW (ref 6–23)
CO2: 30 mEq/L (ref 19–32)
Calcium: 8.9 mg/dL (ref 8.4–10.5)
Chloride: 100 mEq/L (ref 96–112)
Creatinine, Ser: 0.83 mg/dL (ref 0.4–1.2)
GFR calc Af Amer: >60 mL/min (ref >60)
GFR calc non Af Amer: >60 mL/min (ref >60)
Glucose, Bld: 147 mg/dL — ABNORMAL HIGH (ref 70–99)
Potassium: 4.3 mEq/L (ref 3.5–5.1)
Sodium: 136 mEq/L (ref 135–145)

## 2010-11-15 LAB — BASIC METABOLIC PANEL
BUN: 5 mg/dL — ABNORMAL LOW (ref 6–23)
CO2: 24 mEq/L (ref 19–32)
Calcium: 9.2 mg/dL (ref 8.4–10.5)
Chloride: 107 mEq/L (ref 96–112)
Creatinine, Ser: 0.79 mg/dL (ref 0.4–1.2)
GFR calc Af Amer: >60 mL/min (ref >60)
GFR calc non Af Amer: >60 mL/min (ref >60)
Glucose, Bld: 180 mg/dL — ABNORMAL HIGH (ref 70–99)
Potassium: 3.9 mEq/L (ref 3.5–5.1)
Sodium: 137 mEq/L (ref 135–145)

## 2010-11-15 LAB — CBC
HCT: 46.1 % — ABNORMAL HIGH (ref 36.0–46.0)
Hemoglobin: 15.9 g/dL — ABNORMAL HIGH (ref 12.0–15.0)
MCHC: 34.5 g/dL (ref 30.0–36.0)
MCV: 92.8 fL (ref 78.0–100.0)
Platelets: 231 10*3/uL (ref 150–400)
RBC: 4.97 MIL/uL (ref 3.87–5.11)
RDW: 13.6 % (ref 11.5–15.5)
WBC: 7.5 10*3/uL (ref 4.0–10.5)

## 2010-11-15 LAB — DIFFERENTIAL
Basophils Absolute: 0 10*3/uL (ref 0.0–0.1)
Basophils Relative: 1 % (ref 0–1)
Eosinophils Absolute: 0.1 10*3/uL (ref 0.0–0.7)
Eosinophils Relative: 1 % (ref 0–5)
Lymphocytes Relative: 28 % (ref 12–46)
Lymphs Abs: 2 10*3/uL (ref 0.7–4.0)
Monocytes Absolute: 0.6 10*3/uL (ref 0.1–1.0)
Monocytes Relative: 8 % (ref 3–12)
Neutro Abs: 4.7 10*3/uL (ref 1.7–7.7)
Neutrophils Relative %: 63 % (ref 43–77)

## 2010-11-15 LAB — D-DIMER, QUANTITATIVE (NOT AT ARMC): D-Dimer, Quant: 2.04 ug/mL-FEU — ABNORMAL HIGH (ref 0.00–0.48)

## 2010-11-15 LAB — POCT CARDIAC MARKERS
CKMB, poc: <1 ng/mL — ABNORMAL LOW (ref 1.0–8.0)
Myoglobin, poc: 51.4 ng/mL (ref 12–200)
Troponin i, poc: <0.05 ng/mL (ref 0.00–0.09)

## 2010-11-18 LAB — URIC ACID: Uric Acid, Serum: 6.9 mg/dL (ref 2.4–7.0)

## 2010-11-19 ENCOUNTER — Ambulatory Visit (INDEPENDENT_AMBULATORY_CARE_PROVIDER_SITE_OTHER): Payer: Medicaid Other | Admitting: Gastroenterology

## 2010-11-19 DIAGNOSIS — B182 Chronic viral hepatitis C: Secondary | ICD-10-CM

## 2010-11-19 LAB — DIFFERENTIAL
Basophils Absolute: 0 10*3/uL (ref 0.0–0.1)
Basophils Relative: 1 % (ref 0–1)
Eosinophils Absolute: 0.1 10*3/uL (ref 0.0–0.7)
Eosinophils Relative: 1 % (ref 0–5)
Lymphocytes Relative: 29 % (ref 12–46)
Lymphs Abs: 1.9 10*3/uL (ref 0.7–4.0)
Monocytes Absolute: 0.7 10*3/uL (ref 0.1–1.0)
Monocytes Relative: 10 % (ref 3–12)
Neutro Abs: 4 10*3/uL (ref 1.7–7.7)
Neutrophils Relative %: 59 % (ref 43–77)

## 2010-11-19 LAB — POCT I-STAT, CHEM 8
BUN: 4 mg/dL — ABNORMAL LOW (ref 6–23)
Calcium, Ion: 1.18 mmol/L (ref 1.12–1.32)
Chloride: 105 mEq/L (ref 96–112)
Creatinine, Ser: 0.9 mg/dL (ref 0.4–1.2)
Glucose, Bld: 125 mg/dL — ABNORMAL HIGH (ref 70–99)
HCT: 44 % (ref 36.0–46.0)
Hemoglobin: 15 g/dL (ref 12.0–15.0)
Potassium: 4.5 mEq/L (ref 3.5–5.1)
Sodium: 139 mEq/L (ref 135–145)
TCO2: 24 mmol/L (ref 0–100)

## 2010-11-19 LAB — CBC
HCT: 41.1 % (ref 36.0–46.0)
Hemoglobin: 14.4 g/dL (ref 12.0–15.0)
MCHC: 35 g/dL (ref 30.0–36.0)
MCV: 94.1 fL (ref 78.0–100.0)
Platelets: 211 10*3/uL (ref 150–400)
RBC: 4.37 MIL/uL (ref 3.87–5.11)
RDW: 14.5 % (ref 11.5–15.5)
WBC: 6.7 10*3/uL (ref 4.0–10.5)

## 2010-11-26 ENCOUNTER — Telehealth: Payer: Self-pay | Admitting: Internal Medicine

## 2010-11-26 MED ORDER — ZOLPIDEM TARTRATE 10 MG PO TABS: 10.0000 mg | ORAL_TABLET | Freq: Every evening | ORAL | Status: AC | PRN

## 2010-11-26 NOTE — Telephone Encounter (Signed)
Per CDY-I called the [facility] pharmacy and cancelled the Ambien Rx.

## 2010-11-26 NOTE — Telephone Encounter (Signed)
Called and spoke with pt.  Pt aware rx for Ambien has been sent to pharmacy and the Diazepam has been d/c'd.  Pt was still wanting to know if Dr. Maple Hudson has spoken with Dr. Timothy Lasso regarding tx for her Hep C and if it will interfere with COPD. Please advise.  Thanks.

## 2010-11-26 NOTE — Telephone Encounter (Signed)
I reviewed her med list as provided by pharmacy. She is on a lot of potentially sedating meds. I am not comfortable giving ambien and wish to cancel that order from earlier today.

## 2010-11-26 NOTE — Telephone Encounter (Signed)
Pt aware of CDYs recs.

## 2010-11-26 NOTE — Telephone Encounter (Signed)
Spoke w/ pt and she states Dr. Ferd Hibbs office was suppose to contact Dr. Maple Hudson and speak w/ him about pt's treatment's for her hepatitis C. Pt states Dr. Timothy Lasso informed her he wanted to make sure the tx's would not interfere with her COPD. Pt is wanting to know if Dr. Timothy Lasso and Dr. Maple Hudson have spoken yet. Pt also wants to get her lorazepam changed to [place]. Pt states the Remus Loffler helps her more than the lorazepam. Please advise Dr. Maple Hudson. Thanks  Carver Fila, CMA

## 2010-11-26 NOTE — Telephone Encounter (Signed)
Rosanne Sack, pharmacy tech from [facility] called regarding concerns about pt's medications.  She printed off medication list regarding pt's multiple refills from controlled meds from multiple drs.  I informed her CY ordered to d/c the Lorazepam and change to Ambien.  Rosanne Sack stated they had no documentation in their system of pt being on Lorazepam.  However, they do have records of pt being on Diazepam that was prescribed by Dr. Jeanella Flattery.  Rosanne Sack was unsure if CY still wanted to go ahead with the Ambien rx.  Will forward message to Cy to address.  Also put the medication list printed off from the pharmacy on his cart for him to review.  Please advise.

## 2010-11-26 NOTE — Telephone Encounter (Signed)
I don't expect Hepatitis C to affect her breathing or related meds.

## 2010-11-26 NOTE — Telephone Encounter (Signed)
Ok to call pharmacy and D/C lorazepam order then Rx Ambien 10mg  #30 take 1 po qhs prn with 3 refills.

## 2010-11-27 ENCOUNTER — Telehealth: Payer: Self-pay | Admitting: Internal Medicine

## 2010-11-27 NOTE — Telephone Encounter (Signed)
i reviewed her significant list of sedating meds from several providers and felt refill diazepam not appropriate. Pharmacy communicatin has been scanned in.

## 2010-11-27 NOTE — Telephone Encounter (Signed)
Spoke w/ pt and she states the pharmacy advised her that Dr. Maple Hudson was not going to give giver her Remus Loffler. Pt states she does not understand why. I advised her she is already on a lot of sedating medications and that is why. Pt states she needs this called in b/c she can't sleep. Pt advised me that she did not want ambien called in she wants diazepam. I advised her we did not have this on her med list and she states Dr. Maple Hudson has been filling this for her for years. Pt started crying on the phone and states she really needs this to help her sleep at night. Please advise Dr. Maple Hudson. Thanks  Carver Fila, CMA

## 2010-11-27 NOTE — Telephone Encounter (Signed)
Per cdy no...  Spoke w/ pt and advised her of this and she states she doesn't understand why Dr. Maple Hudson is being like this. She states he always refills this for her. Pt states she will talk w/ him the next time she comes in for an OV

## 2010-12-01 ENCOUNTER — Telehealth: Payer: Self-pay | Admitting: Internal Medicine

## 2010-12-01 NOTE — Telephone Encounter (Signed)
Please see phone note prior-this has been taken care of.

## 2010-12-01 NOTE — Telephone Encounter (Signed)
[phone] is the correct phone number for the pharmacy. Amy calling from [facility] wanting clarification on prescriptions for this patient. She wants to know if they should just disregard controlled medications or all medications from Dr. Maple Hudson. Pls advise.

## 2010-12-01 NOTE — Telephone Encounter (Signed)
I have spoke with Amy at the pharmacy-pt is getting diazepam form CDY only per Amy at pharmacy-pt does not seem to be abusing the medication as they are following her habits of refills. I have spoke to [facility] and he is aware of this and has okayed for pt to have a Rx of Diazepam 10mg  #50 take 1-2 by mouth at bedtime if needed with 1 refill until her next OV 01-26-2011. Amy has filled this and understands I will call the patient to inform her.    Pt is aware that Rx is at pharmacy and aware to keep her next OV in June with CDY unless needed sooner.Vivianne Spence

## 2010-12-01 NOTE — Telephone Encounter (Signed)
I asked Florentina Addison to get the list we got from her pharmacy detailing multiple providers ordering several sedating meds.

## 2010-12-01 NOTE — Telephone Encounter (Signed)
Will forward to CDY to advise on or call and speak with patient directly.

## 2010-12-01 NOTE — Telephone Encounter (Signed)
TO SEE DIFFERENT DOCTORS OR GET MEDICATIONS OTHER THAN WHAT SHE IS ON, SHE STATED THAT THIS JUST ISN'T RIGHT THE ONLY MEDICINES THAT SHE TAKES IS HER PAIN MEDICINE AND DIAZEPAM TO SLEEP. SHE IS VERY UPSET STATED HE CAN TEST HER BLOOD IF HE WANTS TO SHE CAN BE REACHED AT [phone].Vedia Coffer

## 2010-12-15 ENCOUNTER — Telehealth: Payer: Self-pay | Admitting: Internal Medicine

## 2010-12-15 MED ORDER — IPRATROPIUM-ALBUTEROL 18-103 MCG/ACT IN AERO: 2.0000 | INHALATION_SPRAY | RESPIRATORY_TRACT | Status: DC | PRN

## 2010-12-15 NOTE — Telephone Encounter (Signed)
Per CDY-ok to DC ventolin HFA and ok to RX combivent #2 2 puffs every 4 hours prn with prn refills.Megan Soto

## 2010-12-15 NOTE — Telephone Encounter (Signed)
Pt says the Ventolin HFA inhaler does not help her at all. She is requesting 2 Combivent inhalers per month instead. She says this is how Dr. Jeannetta Nap always wrote this for her due to cost. Pls advise.No Known Allergies

## 2010-12-15 NOTE — Telephone Encounter (Signed)
Pt.notified rx at pharmacy

## 2010-12-22 NOTE — H&P (Signed)
NAME:  Megan Soto NO.:  [id number]   MEDICAL RECORD NO.:  [id number]          PATIENT TYPE:  OBV   LOCATION:  3021                         FACILITY:  [facility]   PHYSICIAN:  Alvy Beal, MD    DATE OF BIRTH:  [birth year]   DATE OF ADMISSION:  11/22/2008  DATE OF DISCHARGE:                              HISTORY & PHYSICAL   REASON FOR CONSULTATION:  Left foot fracture.   Megan Soto is a very pleasant 50 year old woman who is in her usual state of  health until yesterday.  She was climbing a tree, fell and then injured  her foot.  She has been ambulating on the foot, but with increasing pain  and swelling.  As a result, she was brought to the emergency room today  by her family.  X-rays demonstrate a Lisfranc fracture, so orthopedic  consultation was requested.   The patient's past medical, surgical, family, and social history  includes tobacco use.  She has COPD and socially uses alcohol.  She  states that she has been walking on this foot.   She is on inhaler and a medication for her COPD, but she does not know  the name of it nor the doses.  She does have an allergy to PENICILLIN.   X-rays demonstrate a nondisplaced Lisfranc fracture of the second,  third, and fourth metatarsal heads, but no significant displacement.   CLINICAL EXAM:  She is a pleasant woman who appears her stated age, in  no acute distress.  She is alert and oriented x3.  She has no shortness  of breath or chest pain.  The abdomen is soft and nontender.  Cranial  nerves II-XII were tested, they are intact.  Right lower extremity is  asymptomatic.  Full range of motion of the hip, knee, and ankle.  Calf  compartments are soft bilaterally.  The foot is swollen, is not  immobilized at present.  She does have minimal tenderness with passive  range of motion of the toes.  Sensation to light touch is intact.  Capillary refill is intact and less than 2 seconds in all of her toes  and she has  palpable dorsalis pedis and posterior tibialis pulses.   PLAN:  At this point in time, the patient has a Lisfranc fracture with  some swelling of the foot.  There is no clinical evidence of compartment  syndrome at present.  My plan is to mobilize with a posterior splint and  then elevate and ice.  Because of the swelling, I would like her  admitted for observation to my service.  If she is okay in the morning,  then she can be discharged.  She will be crutched and nonweightbearing.  I will obtain a CT scan during her admission to further evaluate the  bony trauma.  I will ask my partner, Dr. Leonides Grills, a foot and ankle  specialist, to review this to determine if any further intervention will  be required.   see written notes for further details.      Alvy Beal, MD  Electronically  Signed    DDB/MEDQ  D:  11/22/2008  T:  11/23/2008  Job:  [id number]

## 2010-12-25 NOTE — Letter (Signed)
September 18, 2008     RE:  ROYELLE, HINCHMAN  MRN:  [id number]  /  DOB:  03-16-1961   To whom it may concern:   Please be advised that Ms. Megan Soto is under my care for moderately severe  chronic obstructive pulmonary disease with marked chronic bronchitis and  cough.  Her FEV1/FVC ratio is 0.53.  She indicates that she has been  unable to perform sustained physical activity required to maintain  employment.  She might be a candidate for vocational rehabilitation or  very sedentary work with training.    Sincerely,      Clinton D. Maple Hudson, MD, Tonny Bollman, FACP  Electronically Signed    CDY/MedQ  DD: 09/18/2008  DT: 09/18/2008  Job #: [id number]

## 2011-01-01 ENCOUNTER — Emergency Department (HOSPITAL_COMMUNITY)
Admission: EM | Admit: 2011-01-01 | Discharge: 2011-01-01 | Disposition: A | Discharge status: Home / Self Care | Payer: Medicaid Other | Attending: Emergency Medicine | Admitting: Emergency Medicine

## 2011-01-01 DIAGNOSIS — H5789 Other specified disorders of eye and adnexa: Secondary | ICD-10-CM | POA: Insufficient documentation

## 2011-01-01 DIAGNOSIS — E119 Type 2 diabetes mellitus without complications: Secondary | ICD-10-CM | POA: Insufficient documentation

## 2011-01-01 DIAGNOSIS — J4489 Other specified chronic obstructive pulmonary disease: Secondary | ICD-10-CM | POA: Insufficient documentation

## 2011-01-01 DIAGNOSIS — H109 Unspecified conjunctivitis: Secondary | ICD-10-CM | POA: Insufficient documentation

## 2011-01-01 DIAGNOSIS — J449 Chronic obstructive pulmonary disease, unspecified: Secondary | ICD-10-CM | POA: Insufficient documentation

## 2011-01-01 DIAGNOSIS — H5710 Ocular pain, unspecified eye: Secondary | ICD-10-CM | POA: Insufficient documentation

## 2011-01-26 ENCOUNTER — Encounter: Payer: Self-pay | Admitting: Internal Medicine

## 2011-01-26 ENCOUNTER — Ambulatory Visit (INDEPENDENT_AMBULATORY_CARE_PROVIDER_SITE_OTHER): Payer: Medicaid Other | Admitting: Internal Medicine

## 2011-01-26 VITALS — BP 112/72 | HR 108 | Ht 62.0 in | Wt 124.8 lb

## 2011-01-26 DIAGNOSIS — R29818 Other symptoms and signs involving the nervous system: Secondary | ICD-10-CM

## 2011-01-26 DIAGNOSIS — F17200 Nicotine dependence, unspecified, uncomplicated: Secondary | ICD-10-CM

## 2011-01-26 DIAGNOSIS — J449 Chronic obstructive pulmonary disease, unspecified: Secondary | ICD-10-CM

## 2011-01-26 MED ORDER — PROMETHAZINE-CODEINE 6.25-10 MG/5ML PO SYRP: 5.0000 mL | ORAL_SOLUTION | Freq: Four times a day (QID) | ORAL | Status: DC | PRN

## 2011-01-26 MED ORDER — METHYLPREDNISOLONE ACETATE 80 MG/ML IJ SUSP
80.0000 mg | Freq: Once | INTRAMUSCULAR | Status: AC
  Administered 2011-01-26: 80 mg via INTRAMUSCULAR

## 2011-01-26 NOTE — Assessment & Plan Note (Signed)
Discussed options and will give depo today which may help her aches and pains as well as her dyspnea.

## 2011-01-26 NOTE — Progress Notes (Signed)
  Subjective:    Patient ID: Megan Soto, female    DOB: [birth year], 50 y.o.   MRN: [id number]  HPI 01/26/11-  Last here 12/19, 2011- note reviewed.  Breathing has done fairly well with supplemental O2 2 L/M . Heat increases her cough and especially bad at night. Out of Combivent till tomorrow. Discussed overlap of these meds Blames nerves for diffuse somatic pains with a lot of arthritis. Tussive headches. Scant sticky mucus. Uses neb 4x daily. Also on Spiriva- dry mouth.  Up frequently at night, implies diazepam doesn't keep her asleep at night.  Still smoking. Asks for pain meds/ cough syrup- says Dr Jeannetta Nap gave cough syrup 1 month ago. Still gets pain pills from Dr Thurmond Butts. We have provided her diazepam for sleep and anxiety.  Review of Systems Constitutional:   No weight loss, night sweats,  Fevers, chills, fatigue, lassitude. HEENT:   No headaches,  Difficulty swallowing,  Tooth/dental problems,  Sore throat,                No sneezing, itching, ear ache, nasal congestion, post nasal drip,   CV:  No chest pain,  Orthopnea, PND, swelling in lower extremities, anasarca, dizziness, palpitations  GI  No heartburn, indigestion, abdominal pain, nausea, vomiting, diarrhea, change in bowel habits, loss of appetite  Resp: No shortness of breath with exertion or at rest.  No excess mucus, no productive cough,  No non-productive cough,  No coughing up of blood.  No change in color of mucus.  No wheezing.   Skin: no rash or lesions.  GU: no dysuria, change in color of urine, no urgency or frequency.  No flank pain.  MS:  No joint pain or swelling.  No decreased range of motion.  No back pain.  Psych:  No change in mood or affect. No depression or anxiety.  No memory loss.      Objective:   Physical Exam General- Alert, Oriented, Affect-appropriate, Distress- none acute   Portable O2 2L/M,    Exaggerated cough and sighing Skin- rash-none, lesions- none, excoriation- none  Lymphadenopathy-  none  Head- atraumatic  Eyes- Gross vision intact, PERRLA, conjunctivae clear secretions  Ears- Hearing, canals, Tm L ,   R ,  Nose- Clear, No-Septal dev, mucus, polyps, erosion, perforation   Throat- Mallampati II , mucosa clear , drainage- none, tonsils- atrophic  Neck- flexible , trachea midline, no stridor , thyroid nl, carotid no bruit  Chest - symmetrical excursion , unlabored     Heart/CV- RRR , no murmur , no gallop  , no rub, nl s1 s2                     - JVD- none , edema- none, stasis changes- none, varices- none     Lung-  Occasional cough, raspy coarse breath sounds , dullness-none, rub- none     Chest wall-   Abd- tender-no, distended-no, bowel sounds-present, HSM- no  Br/ Gen/ Rectal- Not done, not indicated  Extrem- cyanosis- none, clubbing, none, atrophy- none, strength- nl. Contracture right finger  Neuro- grossly intact to observation            Assessment & Plan:

## 2011-01-26 NOTE — Assessment & Plan Note (Addendum)
Chronic diffuse pains- probably osteoarthritis and anxiety She is always pressing for meds, but doesn't shop multiple pharmacies and sticks to her familiar set of physicians. Emotional coping skills are limited.

## 2011-01-26 NOTE — Patient Instructions (Signed)
Refill script for cough syrup  Depo 80

## 2011-01-27 ENCOUNTER — Encounter: Payer: Self-pay | Admitting: Internal Medicine

## 2011-01-27 NOTE — Assessment & Plan Note (Signed)
Continues to smoke as documented previously. Has not sustained a real effort to stop despite counseling and provision of support.

## 2011-02-17 ENCOUNTER — Telehealth: Payer: Self-pay | Admitting: Internal Medicine

## 2011-02-17 DIAGNOSIS — J449 Chronic obstructive pulmonary disease, unspecified: Secondary | ICD-10-CM

## 2011-02-17 MED ORDER — ALBUTEROL SULFATE (2.5 MG/3ML) 0.083% IN NEBU: 2.5000 mg | INHALATION_SOLUTION | Freq: Four times a day (QID) | RESPIRATORY_TRACT | Status: DC | PRN

## 2011-02-17 NOTE — Telephone Encounter (Signed)
Spoke with pt. She is requesting refill on her albuterol neb sol. Rx was sent to pharm per her request.

## 2011-03-04 ENCOUNTER — Other Ambulatory Visit: Payer: Self-pay | Admitting: *Deleted

## 2011-03-04 MED ORDER — OMEPRAZOLE 20 MG PO CPDR: 20.0000 mg | DELAYED_RELEASE_CAPSULE | Freq: Every day | ORAL | Status: DC

## 2011-03-24 ENCOUNTER — Telehealth: Payer: Self-pay | Admitting: Internal Medicine

## 2011-03-24 NOTE — Telephone Encounter (Signed)
Per CY-have patient take 3 tablets of diazepam at bedtime and then call when finished.     Pt aware .

## 2011-03-24 NOTE — Telephone Encounter (Signed)
I spoke with the pt and she states she is currently taking diazepam 10mg  2 tablets at bedtime and she is having trouble staying asleep. She states she is also having night sweats as well. She is asking to switch to [place]. Please advise. Carron Curie, CMA No Known Allergies

## 2011-03-26 ENCOUNTER — Telehealth: Payer: Self-pay | Admitting: Internal Medicine

## 2011-03-26 MED ORDER — ZOLPIDEM TARTRATE 10 MG PO TABS: 10.0000 mg | ORAL_TABLET | Freq: Every evening | ORAL | Status: AC | PRN

## 2011-03-26 NOTE — Telephone Encounter (Signed)
Per CY-okay to D/C diazepam and Rx Ambien 10 mg #30 take 1 by mouth at bedtime prn sleep with 1 refill.

## 2011-03-26 NOTE — Telephone Encounter (Signed)
Pt is requesting to change her diazepam to [place] and says the diazepam is not working as well for her. Pls advise. No Known Allergies

## 2011-03-26 NOTE — Telephone Encounter (Signed)
Pt is aware of rx for ambien and to discontinue the diazepam. RX sent to [facility] pharmacy.

## 2011-04-09 ENCOUNTER — Encounter: Payer: Self-pay | Admitting: Internal Medicine

## 2011-04-27 ENCOUNTER — Encounter: Payer: Self-pay | Admitting: Internal Medicine

## 2011-04-27 ENCOUNTER — Ambulatory Visit (INDEPENDENT_AMBULATORY_CARE_PROVIDER_SITE_OTHER): Payer: Medicaid Other | Admitting: Internal Medicine

## 2011-04-27 VITALS — BP 110/80 | HR 94 | Ht 62.0 in | Wt 126.0 lb

## 2011-04-27 DIAGNOSIS — Z23 Encounter for immunization: Secondary | ICD-10-CM

## 2011-04-27 DIAGNOSIS — F17200 Nicotine dependence, unspecified, uncomplicated: Secondary | ICD-10-CM

## 2011-04-27 DIAGNOSIS — J449 Chronic obstructive pulmonary disease, unspecified: Secondary | ICD-10-CM

## 2011-04-27 DIAGNOSIS — J4489 Other specified chronic obstructive pulmonary disease: Secondary | ICD-10-CM

## 2011-04-27 MED ORDER — LORAZEPAM 0.5 MG PO TABS: ORAL_TABLET | ORAL | Status: DC

## 2011-04-27 MED ORDER — ALBUTEROL SULFATE (2.5 MG/3ML) 0.083% IN NEBU: 2.5000 mg | INHALATION_SOLUTION | Freq: Four times a day (QID) | RESPIRATORY_TRACT | Status: DC | PRN

## 2011-04-27 NOTE — Patient Instructions (Addendum)
Continue lung medicines as you are doing  Script Ativan- no more than 1 tab, up to 3 times daily if needed  Flu vax

## 2011-04-27 NOTE — Progress Notes (Signed)
Subjective:     HPI Review of Systems      Physical Exam           Patient ID: Megan Soto, female    DOB: August 27, 1960, 50 y.o.   MRN: [id number]  HPI 01/26/11- 50 year old female smoker followed for COPD complicated by anxiety, DM, GERD, musculoskeletal pain, hepatitis C. Last here 12/19, 2011- note reviewed.  Breathing has done fairly well with supplemental O2 2 L/M . Heat increases her cough and especially bad at night. Out of Combivent till tomorrow. Discussed overlap of these meds. Blames nerves for diffuse somatic pains with a lot of arthritis. Tussive headches. Scant sticky mucus. Uses neb 4x daily. Also on Spiriva- dry mouth.  Up frequently at night, implies diazepam doesn't keep her asleep at night.  Still smoking. Asks for pain meds/ cough syrup- says Dr Jeannetta Nap gave cough syrup 1 month ago. Still gets pain pills from Dr Thurmond Butts. We have provided her diazepam for sleep and anxiety, which helped to reduce her smoking.  Review of Systems Constitutional:   No weight loss, night sweats,  Fevers, chills, fatigue, lassitude. HEENT:   No headaches,  Difficulty swallowing,  Tooth/dental problems,  Sore throat,                No sneezing, itching, ear ache, nasal congestion, post nasal drip,  CV-No chest pain,  Orthopnea, PND, swelling in lower extremities, anasarca, dizziness, palpitations GI  No heartburn, indigestion, abdominal pain, nausea, vomiting, diarrhea, change in bowel habits, loss of appetite Resp: No shortness of breath with exertion or at rest.  No excess mucus, no productive cough,  No non-productive cough,  No coughing up of blood.  No change in color of mucus.  No wheezing.  Skin: no rash or lesions. GU: no dysuria, change in color of urine, no urgency or frequency.  No flank pain. MS:  No joint pain or swelling.  No decreased range of motion.  No back pain. Psych:  No change in mood or affect. No depression or anxiety.  No memory loss.      Objective:   Physical  Exam General- Alert, Oriented, Affect-, calmer than usual, Distress- none acute;  portable O2 2 L per minute Skin- rash-none, lesions- none, excoriation- none Lymphadenopathy- none Head- atraumatic            Eyes- Gross vision intact, PERRLA, conjunctivae clear secretions            Ears- Hearing, canals-normal            Nose- Clear, no-Septal dev, mucus, polyps, erosion, perforation             Throat- Mallampati II , mucosa clear , drainage- none, tonsils- atrophic Neck- flexible , trachea midline, no stridor , thyroid nl, carotid no bruit Chest - symmetrical excursion , unlabored           Heart/CV- RRR , no murmur , no gallop  , no rub, nl s1 s2                           - JVD- none , edema- none, stasis changes- none, varices- none           Lung- clear to P&A, wheeze- none, cough- none , dullness-none, rub- none           Chest wall-  Abd- tender-no, distended-no, bowel sounds-present, HSM- no Br/ Gen/ Rectal- Not done, not indicated Extrem- cyanosis-  none, clubbing, none, atrophy- none, strength- nl,  contracture of finger Neuro- grossly intact to observation \            Assessment & Plan:

## 2011-04-28 ENCOUNTER — Telehealth: Payer: Self-pay | Admitting: Internal Medicine

## 2011-04-28 NOTE — Telephone Encounter (Signed)
Fax received from [facility] regarding PA for Zolpidem. This medication was D/C'd at 04/27/11 OV and pt was given rx for Ativan. I spoke with the pharmacist and she said the pt filled the Ativan prescription on 9/18 and they would take Zolpidem off of her list.

## 2011-05-01 NOTE — Assessment & Plan Note (Signed)
We continue to work on smoking cessation, again discussing the available support

## 2011-05-01 NOTE — Assessment & Plan Note (Signed)
She is clearer and looks more comfortable than I usually see her today. Reviewed medications

## 2011-05-05 LAB — CBC
HCT: 38.6
Hemoglobin: 13.7
MCHC: 35.5
MCV: 91.1
Platelets: 173
RBC: 4.23
RDW: 12.8
WBC: 9.2

## 2011-05-05 LAB — URINALYSIS, ROUTINE W REFLEX MICROSCOPIC
Bilirubin Urine: NEGATIVE
Glucose, UA: NEGATIVE
Hgb urine dipstick: NEGATIVE
Ketones, ur: NEGATIVE
Nitrite: NEGATIVE
Protein, ur: NEGATIVE
Specific Gravity, Urine: 1.008
Urobilinogen, UA: 0.2
pH: 6

## 2011-05-05 LAB — POCT I-STAT 3, ART BLOOD GAS (G3+)
Acid-base deficit: 2
Bicarbonate: 18.1 — ABNORMAL LOW
O2 Saturation: 99
Operator id: 280991
TCO2: 19
pCO2 arterial: 21 — ABNORMAL LOW
pH, Arterial: 7.544 — ABNORMAL HIGH
pO2, Arterial: 126 — ABNORMAL HIGH

## 2011-05-05 LAB — POCT I-STAT, CHEM 8
BUN: 8
Calcium, Ion: 1.05 — ABNORMAL LOW
Chloride: 108
Creatinine, Ser: 1.1
Glucose, Bld: 108 — ABNORMAL HIGH
HCT: 40
Hemoglobin: 13.6
Potassium: 3.2 — ABNORMAL LOW
Sodium: 140
TCO2: 20

## 2011-05-05 LAB — DIFFERENTIAL
Basophils Absolute: 0
Basophils Relative: 1
Eosinophils Absolute: 0.1
Eosinophils Relative: 1
Lymphocytes Relative: 29
Lymphs Abs: 2.6
Monocytes Absolute: 0.7
Monocytes Relative: 8
Neutro Abs: 5.7
Neutrophils Relative %: 62

## 2011-05-05 LAB — POCT CARDIAC MARKERS
CKMB, poc: <1 — ABNORMAL LOW
Myoglobin, poc: 34.6
Operator id: 294501
Troponin i, poc: <0.05

## 2011-05-05 LAB — URINE CULTURE
Colony Count: NO GROWTH
Culture: NO GROWTH

## 2011-05-05 LAB — B-NATRIURETIC PEPTIDE (CONVERTED LAB): Pro B Natriuretic peptide (BNP): <30

## 2011-05-07 LAB — CULTURE, ROUTINE-ABSCESS

## 2011-05-18 LAB — COMPREHENSIVE METABOLIC PANEL
ALT: 26
AST: 22
Albumin: 3.9
Alkaline Phosphatase: 85
BUN: 5 — ABNORMAL LOW
CO2: 26
Calcium: 8.8
Chloride: 104
Creatinine, Ser: 0.91
GFR calc Af Amer: >60
GFR calc non Af Amer: >60
Glucose, Bld: 97
Potassium: 3.9
Sodium: 134 — ABNORMAL LOW
Total Bilirubin: 0.7
Total Protein: 7.3

## 2011-05-18 LAB — I-STAT 8, (EC8 V) (CONVERTED LAB)
Acid-base deficit: 2
BUN: 5 — ABNORMAL LOW
Bicarbonate: 24
Chloride: 107
Glucose, Bld: 93
HCT: 45
Hemoglobin: 15.3 — ABNORMAL HIGH
Operator id: 294501
Potassium: 4.1
Sodium: 137
TCO2: 25
pCO2, Ven: 44.4 — ABNORMAL LOW
pH, Ven: 7.341 — ABNORMAL HIGH

## 2011-05-18 LAB — DIFFERENTIAL
Basophils Absolute: 0
Basophils Relative: 0
Eosinophils Absolute: 0.1 — ABNORMAL LOW
Eosinophils Relative: 1
Lymphocytes Relative: 26
Lymphs Abs: 2.7
Monocytes Absolute: 0.6
Monocytes Relative: 6
Neutro Abs: 7.1
Neutrophils Relative %: 67

## 2011-05-18 LAB — WET PREP, GENITAL
Clue Cells Wet Prep HPF POC: NONE SEEN
Trich, Wet Prep: NONE SEEN
WBC, Wet Prep HPF POC: NONE SEEN
Yeast Wet Prep HPF POC: NONE SEEN

## 2011-05-18 LAB — URINALYSIS, ROUTINE W REFLEX MICROSCOPIC
Bilirubin Urine: NEGATIVE
Glucose, UA: NEGATIVE
Ketones, ur: NEGATIVE
Leukocytes, UA: NEGATIVE
Nitrite: NEGATIVE
Protein, ur: NEGATIVE
Specific Gravity, Urine: 1.012
Urobilinogen, UA: 0.2
pH: 5.5

## 2011-05-18 LAB — URINE MICROSCOPIC-ADD ON

## 2011-05-18 LAB — CBC
HCT: 41.2
Hemoglobin: 14.1
MCHC: 34.2
MCV: 92.6
Platelets: 308
RBC: 4.44
RDW: 12.4
WBC: 10.6 — ABNORMAL HIGH

## 2011-05-18 LAB — POCT I-STAT CREATININE
Creatinine, Ser: 1
Operator id: 294501

## 2011-05-18 LAB — LIPASE, BLOOD: Lipase: 34

## 2011-05-18 LAB — PREGNANCY, URINE: Preg Test, Ur: NEGATIVE

## 2011-05-18 LAB — GC/CHLAMYDIA PROBE AMP, GENITAL
Chlamydia, DNA Probe: NEGATIVE
GC Probe Amp, Genital: NEGATIVE

## 2011-06-11 ENCOUNTER — Other Ambulatory Visit: Payer: Self-pay | Admitting: Internal Medicine

## 2011-06-11 ENCOUNTER — Telehealth: Payer: Self-pay | Admitting: Internal Medicine

## 2011-06-11 MED ORDER — OMEPRAZOLE 20 MG PO CPDR: 20.0000 mg | DELAYED_RELEASE_CAPSULE | Freq: Every day | ORAL | Status: DC

## 2011-06-11 MED ORDER — LORAZEPAM 0.5 MG PO TABS: ORAL_TABLET | ORAL | Status: DC

## 2011-06-11 NOTE — Telephone Encounter (Signed)
Per CY-sorry the lorazepam would be better.

## 2011-06-11 NOTE — Telephone Encounter (Signed)
Lorazepam will work as well or better than diazepam for this. She can take 1 or 2 lorazepam 0.5 mg if needed. I can't change to diazepam unless we stop the lorazepam script.

## 2011-06-11 NOTE — Telephone Encounter (Signed)
I spoke with pt and she states she would like an rx for diazepam to help her sleep. Pt states the lorazepam helps her nerves but does not help her sleep. Pt states she would like this today. Please advise Dr. Maple Hudson, thanks  Carver Fila, CMA

## 2011-06-11 NOTE — Telephone Encounter (Signed)
Lorazepam called to [facility] with the new directions for use. The pharmacist thought we were calling in a new rx for the diazepam and stated that the pt last received this medication on 05/10/2011. Pt has been using both the diazepam and lorazepam. I have asked that the pharmacy forward a copy of her medications that they have been filling so CDY could review. I spoke with CDY and he had suspected this. Will give CDY this list once fax received. Pharmacist aware we will not be refilling the diazepam.

## 2011-06-14 NOTE — Telephone Encounter (Signed)
CY was given this info on Friday- I was under the impression that this was taken care of by [facility]. Unsure and will forward to CY to advise.

## 2011-06-14 NOTE — Telephone Encounter (Signed)
Katie, did you receive the list of medications for this pt from [facility] pharmacy? Carron Curie, CMA

## 2011-06-14 NOTE — Telephone Encounter (Signed)
Pharmacy is aware. Jennifer Castillo, CMA  

## 2011-06-14 NOTE — Telephone Encounter (Signed)
I will prescribe either diazepam or lorazepam, but not both. They are in the same family and have the same properties. Basically lorazepam should last longer.

## 2011-06-14 NOTE — Telephone Encounter (Signed)
Patient calling asking about rx refills.  [phone]

## 2011-06-16 ENCOUNTER — Telehealth: Payer: Self-pay | Admitting: Internal Medicine

## 2011-06-16 NOTE — Telephone Encounter (Signed)
I will defer to Dr Thurmond Butts since he is her pain doctor. Please have pharmacy cancel our lorazepam script and honor his Xanax. Explain to her that these medicines are too similar, and should not be coming from multiple doctors.

## 2011-06-16 NOTE — Telephone Encounter (Signed)
I spoke with Amy from [facility] pharmacy and she states Dr. Thurmond Butts gave pt and rx for xanax 1 mg take 1 TID today #90 day supply. Amy states they have not filled this medication for pt yet. Amy states they are wanting to know if Dr. Maple Hudson wants pt to be on xanax 1 mg instead of the lorazepam 0.5mg . Amy states they are not going to fill both for pt. Amy faxed over report of pts dispense report of her medications she has filled. Please advise Dr. Maple Hudson, thanks  Carver Fila, CMA

## 2011-06-16 NOTE — Telephone Encounter (Signed)
I have called the pharmacy and they have cancelled our lorazepam. Will call patient in the morning and give information to patient.

## 2011-06-18 NOTE — Telephone Encounter (Signed)
I spoke with pt and she states she was already aware the lorazepam was cancelled. Pt states she was already aware and stated it was fine bc she felt like the lorazepam did not help her at all.

## 2011-07-05 ENCOUNTER — Telehealth: Payer: Self-pay | Admitting: Internal Medicine

## 2011-07-05 MED ORDER — TIOTROPIUM BROMIDE MONOHYDRATE 18 MCG IN CAPS: 18.0000 ug | ORAL_CAPSULE | Freq: Every day | RESPIRATORY_TRACT | Status: DC

## 2011-07-05 NOTE — Telephone Encounter (Signed)
I spoke with Megan Soto and is wanting to know if she has had the whooping cough injection. I checked both systems and did not show that Megan Soto did. Megan Soto states she will let Dr. Jeannetta Nap know. Megan Soto also requesting a refill on her spiriva sent to [facility]. i advised Megan Soto will send rx for her

## 2011-07-07 ENCOUNTER — Telehealth: Payer: Self-pay | Admitting: Internal Medicine

## 2011-07-07 NOTE — Telephone Encounter (Signed)
Reviewed pt's chart in [facility] and [facility] and didn't see any documentation of PNA vaccine.  No paper chart as pt was new to [facility] in 2009.  Called and spoke with pt and informed her no PNA vaccine recorded in our records.  Pt verbalized understanding and denied any questions.

## 2011-08-16 ENCOUNTER — Telehealth: Payer: Self-pay | Admitting: Allergy

## 2011-08-16 MED ORDER — ALBUTEROL 90 MCG/ACT IN AERS: 2.0000 | INHALATION_SPRAY | RESPIRATORY_TRACT | Status: DC | PRN

## 2011-08-16 NOTE — Telephone Encounter (Signed)
Ok per [employer] changes can have ventolin

## 2011-08-16 NOTE — Telephone Encounter (Signed)
Pharmacy sent a request for ventolin Looks like patient was taking off this

## 2011-08-16 NOTE — Telephone Encounter (Signed)
Pharmacy requested rx refill on ventolin  Looks like patient was taking  Off this  Last fill was 08/07/11 Dr Maple Hudson do you want to keep patient on this ? Please advise. Thanks.

## 2011-08-17 ENCOUNTER — Other Ambulatory Visit: Payer: Self-pay | Admitting: Internal Medicine

## 2011-08-17 MED ORDER — ALBUTEROL 90 MCG/ACT IN AERS: 2.0000 | INHALATION_SPRAY | RESPIRATORY_TRACT | Status: DC | PRN

## 2011-09-02 ENCOUNTER — Ambulatory Visit: Payer: Medicaid Other | Admitting: Internal Medicine

## 2011-09-17 ENCOUNTER — Other Ambulatory Visit: Payer: Self-pay | Admitting: Family Medicine

## 2011-09-17 DIAGNOSIS — Z1231 Encounter for screening mammogram for malignant neoplasm of breast: Secondary | ICD-10-CM

## 2011-09-28 ENCOUNTER — Ambulatory Visit
Admission: RE | Admit: 2011-09-28 | Discharge: 2011-09-28 | Disposition: A | Discharge status: Home / Self Care | Payer: Medicaid Other | Source: Ambulatory Visit | Attending: Family Medicine | Admitting: Family Medicine

## 2011-09-28 DIAGNOSIS — Z1231 Encounter for screening mammogram for malignant neoplasm of breast: Secondary | ICD-10-CM

## 2011-10-07 ENCOUNTER — Encounter: Payer: Self-pay | Admitting: Internal Medicine

## 2011-10-07 ENCOUNTER — Ambulatory Visit (INDEPENDENT_AMBULATORY_CARE_PROVIDER_SITE_OTHER): Payer: Medicaid Other | Admitting: Internal Medicine

## 2011-10-07 VITALS — BP 112/96 | HR 90 | Ht 62.0 in | Wt 107.4 lb

## 2011-10-07 DIAGNOSIS — F17200 Nicotine dependence, unspecified, uncomplicated: Secondary | ICD-10-CM

## 2011-10-07 DIAGNOSIS — Z23 Encounter for immunization: Secondary | ICD-10-CM

## 2011-10-07 DIAGNOSIS — J449 Chronic obstructive pulmonary disease, unspecified: Secondary | ICD-10-CM

## 2011-10-07 NOTE — Progress Notes (Signed)
Patient ID: Megan Soto, female    DOB: August 29, 1960, 51 y.o.   MRN: [id number]  HPI 01/26/11- 51 year old female smoker followed for COPD complicated by anxiety, DM, GERD, musculoskeletal pain, hepatitis C. Last here 12/19, 2011- note reviewed.  Breathing has done fairly well with supplemental O2 2 L/M . Heat increases her cough and especially bad at night. Out of Combivent till tomorrow. Discussed overlap of these meds. Blames nerves for diffuse somatic pains with a lot of arthritis. Tussive headches. Scant sticky mucus. Uses neb 4x daily. Also on Spiriva- dry mouth.  Up frequently at night, implies diazepam doesn't keep her asleep at night.  Still smoking. Asks for pain meds/ cough syrup- says Dr Jeannetta Nap gave cough syrup 1 month ago. Still gets pain pills from Dr Thurmond Butts. We have provided her diazepam for sleep and anxiety, which helped to reduce her smoking.  10/07/11- 51 year old female smoker followed for COPD complicated by anxiety, DM, GERD, musculoskeletal pain, hepatitis C She denies acute problems. Reports that 3 weeks ago Dr. Jeannetta Nap did chest x-ray when she was coughing some blood in the sputum. Treated for infection. Sputum has cleared and she feels back to baseline. Continues oxygen 2 L/Advanced used for sleep and exertion. Using albuterol or Combivent rescue inhaler several times a day, nebulizer 2 or 3 times a day plus Spiriva. Reluctant to use Advair because of TV advertisements describing "infection". We discussed over use of stimulants bronchodilators.  Review of Systems Constitutional:   No weight loss, night sweats,  Fevers, chills, fatigue, lassitude. HEENT:   No headaches,  Difficulty swallowing,  Tooth/dental problems,  Sore throat,                No sneezing, itching, ear ache, nasal congestion, post nasal drip,  CV-No chest pain,  Orthopnea, PND, swelling in lower extremities, anasarca, dizziness, palpitations GI  No heartburn, indigestion, abdominal pain, nausea, vomiting,  diarrhea, change in bowel habits, loss of appetite Resp: + shortness of breath with exertion or at rest.  No excess mucus, + productive cough,  + non-productive cough, + coughing up of blood.  No change in color of mucus.  + wheezing.  Skin: no rash or lesions. GU: no dysuria,  MS:  No acute joint pain or swelling.    No back pain. Psych:  No change in mood or affect. + depression or anxiety.  No memory loss.      Objective:   Physical Exam General- Alert, Oriented, Affect-, calmer than usual, Distress- none acute;  portable O2 2 L per minute Skin- rash-none, lesions- none, excoriation- none Lymphadenopathy- none Head- atraumatic            Eyes- Gross vision intact, PERRLA, conjunctivae clear secretions            Ears- Hearing, canals-normal            Nose- Clear, no-Septal dev, mucus, polyps, erosion, perforation             Throat- Mallampati II , mucosa clear , drainage- none, tonsils- atrophic, dry "throat tickle" cough Neck- flexible , trachea midline, no stridor , thyroid nl, carotid no bruit Chest - symmetrical excursion , unlabored           Heart/CV- RRR , no murmur , no gallop  , no rub, nl s1 s2                           -  JVD- none , edema- none, stasis changes- none, varices- none           Lung- clear to P&A, wheeze- none,  , dullness-none, rub- none           Chest wall-  Abd- tender-no, distended-no, bowel sounds-present, HSM- no Br/ Gen/ Rectal- Not done, not indicated Extrem- cyanosis- none, clubbing, none, atrophy- none, strength- nl,  contracture of finger Neuro- grossly intact to observation \

## 2011-10-07 NOTE — Patient Instructions (Addendum)
Sample Dulera 100      2 puffs and rinse mouth well, twice daily  See if this lets you get by with less of your rescue inhalers and nebulizer treatments.  If you cough up more blood, please let us know.   Please keep trying to stop smoking. The patches or electronic cigarettes may be better than smoking.  Pneumovax

## 2011-10-11 NOTE — Assessment & Plan Note (Signed)
We have again reviewed the importance of smoking cessation and discussed accessible support methods including patches. No drug or have her use electronic cigarettes than standard tobacco.

## 2011-10-11 NOTE — Assessment & Plan Note (Signed)
Over using stimulant bronchodilators. I don't think this will help her breathing and it certainly makes her anxiety worse as discussed. She didn't want to use Advair but was willing to try [facility] even when I explained it was basically the same idea medically. Hopefully this will provide some stability and allow reduced use of rescue medicines. We emphasized avoidance of tobacco smoke in the importance of reporting if she has more hemoptysis. I have not seen the recent chest x-ray reported as "clear".

## 2011-11-10 ENCOUNTER — Telehealth: Payer: Self-pay | Admitting: Internal Medicine

## 2011-11-10 MED ORDER — IPRATROPIUM-ALBUTEROL 18-103 MCG/ACT IN AERO: 2.0000 | INHALATION_SPRAY | RESPIRATORY_TRACT | Status: DC | PRN

## 2011-11-10 MED ORDER — TIOTROPIUM BROMIDE MONOHYDRATE 18 MCG IN CAPS: 18.0000 ug | ORAL_CAPSULE | Freq: Every day | RESPIRATORY_TRACT | Status: DC

## 2011-11-10 NOTE — Telephone Encounter (Signed)
RX for spiriva and Combivent sent to [facility] and pt is aware.

## 2011-11-19 ENCOUNTER — Emergency Department (HOSPITAL_COMMUNITY)
Admission: EM | Admit: 2011-11-19 | Discharge: 2011-11-20 | Disposition: A | Discharge status: Home / Self Care | Payer: Medicaid Other | Attending: Emergency Medicine | Admitting: Emergency Medicine

## 2011-11-19 ENCOUNTER — Encounter (HOSPITAL_COMMUNITY): Payer: Self-pay | Admitting: *Deleted

## 2011-11-19 ENCOUNTER — Emergency Department (HOSPITAL_COMMUNITY): Payer: Medicaid Other

## 2011-11-19 DIAGNOSIS — J449 Chronic obstructive pulmonary disease, unspecified: Secondary | ICD-10-CM

## 2011-11-19 DIAGNOSIS — B1920 Unspecified viral hepatitis C without hepatic coma: Secondary | ICD-10-CM | POA: Insufficient documentation

## 2011-11-19 DIAGNOSIS — R0602 Shortness of breath: Secondary | ICD-10-CM | POA: Insufficient documentation

## 2011-11-19 DIAGNOSIS — M25559 Pain in unspecified hip: Secondary | ICD-10-CM | POA: Insufficient documentation

## 2011-11-19 DIAGNOSIS — F17200 Nicotine dependence, unspecified, uncomplicated: Secondary | ICD-10-CM | POA: Insufficient documentation

## 2011-11-19 DIAGNOSIS — J4489 Other specified chronic obstructive pulmonary disease: Secondary | ICD-10-CM | POA: Insufficient documentation

## 2011-11-19 DIAGNOSIS — M549 Dorsalgia, unspecified: Secondary | ICD-10-CM

## 2011-11-19 HISTORY — DX: Fibromyalgia: M79.7

## 2011-11-19 HISTORY — DX: Unspecified viral hepatitis C without hepatic coma: B19.20

## 2011-11-19 NOTE — ED Notes (Signed)
Patient transported to X-ray 

## 2011-11-19 NOTE — ED Notes (Signed)
Pt states she needs something for pain, "her chest is burning"  Pain in right leg now

## 2011-11-19 NOTE — ED Notes (Signed)
[id number]<AV> Expected date:<BR> Expected time: 9:41 PM<BR> Means of arrival:<BR> Comments:<BR> M251 - 51yoF Coughing up blood several days

## 2011-11-20 ENCOUNTER — Emergency Department (HOSPITAL_COMMUNITY): Payer: Medicaid Other

## 2011-11-20 MED ORDER — AZITHROMYCIN 250 MG PO TABS: 250.0000 mg | ORAL_TABLET | Freq: Every day | ORAL | Status: AC

## 2011-11-20 MED ORDER — BENZONATATE 100 MG PO CAPS: 200.0000 mg | ORAL_CAPSULE | Freq: Two times a day (BID) | ORAL | Status: AC | PRN

## 2011-11-20 MED ORDER — PREDNISONE 20 MG PO TABS: 40.0000 mg | ORAL_TABLET | Freq: Every day | ORAL | Status: AC

## 2011-11-20 MED ORDER — PREDNISONE 20 MG PO TABS
40.0000 mg | ORAL_TABLET | Freq: Once | ORAL | Status: AC
  Administered 2011-11-20: 40 mg via ORAL
  Filled 2011-11-20: qty 2

## 2011-11-20 MED ORDER — KETOROLAC TROMETHAMINE 60 MG/2ML IM SOLN
60.0000 mg | Freq: Once | INTRAMUSCULAR | Status: AC
  Administered 2011-11-20: 60 mg via INTRAMUSCULAR
  Filled 2011-11-20: qty 2

## 2011-11-20 MED ORDER — AZITHROMYCIN 250 MG PO TABS
500.0000 mg | ORAL_TABLET | Freq: Once | ORAL | Status: AC
  Administered 2011-11-20: 500 mg via ORAL
  Filled 2011-11-20: qty 2
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
## 2011-11-20 NOTE — ED Notes (Signed)
Pt continues to be belligerent  [employer] at bedside,  Pt is now on phone stating we "haven't done a thing for her",  Pt says she also has her own pain medication to take.  Pt has been advised not to smoke in bathroom,

## 2011-11-20 NOTE — Discharge Instructions (Signed)
Labs are normal showing no signs of infection or broken bones in your pelvis. Because of her ongoing coughing up blood you need to see your family Dr. for further testing to rule out lung cancer. Please stop smoking immediately, take prednisone for the next 5 days and use her albuterol inhalers every 4 hours. Take Zithromax once a day as prescribed.

## 2011-12-21 ENCOUNTER — Telehealth: Payer: Self-pay | Admitting: Internal Medicine

## 2011-12-21 NOTE — Telephone Encounter (Signed)
Per Florentina Addison she is trying to reach pt and send message to her

## 2011-12-22 NOTE — Telephone Encounter (Signed)
Called patient back again; she is aware to be here Friday 12-24-11 at 1130am to see [facility] for her coughing up blood per Dr Jeannetta Nap.

## 2011-12-22 NOTE — Telephone Encounter (Signed)
I spoke with Dr Jeannetta Nap nurse-Amy; aware I have put pt on the schedule for Friday 12-24-11 at 1130am; I have been trying to reach patient regarding appt needed but unable to reach her. I attempted the call again this morning and unable to reach patient or leave a message for patient to call me back. Will need to continue trying patient through out the day as she needs to be seen by [facility].

## 2011-12-24 ENCOUNTER — Encounter: Payer: Self-pay | Admitting: Internal Medicine

## 2011-12-24 ENCOUNTER — Ambulatory Visit (INDEPENDENT_AMBULATORY_CARE_PROVIDER_SITE_OTHER): Payer: Medicaid Other | Admitting: Internal Medicine

## 2011-12-24 VITALS — BP 110/82 | HR 110 | Ht 62.0 in | Wt 106.6 lb

## 2011-12-24 DIAGNOSIS — J4489 Other specified chronic obstructive pulmonary disease: Secondary | ICD-10-CM

## 2011-12-24 DIAGNOSIS — R29818 Other symptoms and signs involving the nervous system: Secondary | ICD-10-CM

## 2011-12-24 DIAGNOSIS — J449 Chronic obstructive pulmonary disease, unspecified: Secondary | ICD-10-CM

## 2011-12-24 DIAGNOSIS — F17200 Nicotine dependence, unspecified, uncomplicated: Secondary | ICD-10-CM

## 2011-12-24 NOTE — Assessment & Plan Note (Signed)
Chronic bronchitis. Smoking is paramount but hard to be sure how important reflux role is.  Meds reviewed, no changes to offer.

## 2011-12-24 NOTE — Patient Instructions (Addendum)
Try otc Gellusil liquid to soothe irritated throat and reduce cough  Try otc Delsym cough syrup to soothe and coat. Throat lozenges may also help.   Try to go at least an hour and a half between cigarettes. Use the electronic cig instead of tobacco.

## 2011-12-24 NOTE — Assessment & Plan Note (Signed)
I had her compare appeal of regular vs electronic cigs. Try to steer away from tobacco.

## 2011-12-24 NOTE — Assessment & Plan Note (Signed)
I am sure she probably hurts some, but descriptions are vague, not progressive, and seem mostly intended to probe for my willingness to give pain meds.

## 2011-12-24 NOTE — Progress Notes (Signed)
Patient ID: Megan Soto, female    DOB: Feb 04, 1961, 51 y.o.   MRN: [id number]  HPI 01/26/11- 51 year old female smoker followed for COPD complicated by anxiety, DM, GERD, musculoskeletal pain, hepatitis C. Last here 12/19, 2011- note reviewed.  Breathing has done fairly well with supplemental O2 2 L/M . Heat increases her cough and especially bad at night. Out of Combivent till tomorrow. Discussed overlap of these meds. Blames nerves for diffuse somatic pains with a lot of arthritis. Tussive headches. Scant sticky mucus. Uses neb 4x daily. Also on Spiriva- dry mouth.  Up frequently at night, implies diazepam doesn't keep her asleep at night.  Still smoking. Asks for pain meds/ cough syrup- says Dr Jeannetta Nap gave cough syrup 1 month ago. Still gets pain pills from Dr Thurmond Butts. We have provided her diazepam for sleep and anxiety, which helped to reduce her smoking.  10/07/11- 51 year old female smoker followed for COPD complicated by anxiety, DM, GERD, musculoskeletal pain, hepatitis C She denies acute problems. Reports that 3 weeks ago Dr. Jeannetta Nap did chest x-ray when she was coughing some blood in the sputum. Treated for infection. Sputum has cleared and she feels back to baseline. Continues oxygen 2 L/Advanced used for sleep and exertion. Using albuterol or Combivent rescue inhaler several times a day, nebulizer 2 or 3 times a day plus Spiriva. Reluctant to use Advair because of TV advertisements describing "infection". We discussed over- use of stimulant bronchodilators.  12/24/11- 51 year old female smoker followed for COPD complicated by anxiety, DM, GERD, musculoskeletal pain, hepatitis C Patient c/o sore throat, sob with exertion, wheeizng, and chest tightness.  Trying to keep her smoking down to 1 per hour. Cough rasps and keeps throat sore. occasional blood streak still, intermittent, not progressive. Denies nodes, chest pain. Biggest problem remains her chronic anxiety. She mentioned some  scattered somatic pains but I did not invite request for pain med. Medicaid did not want to do CT without more indication. CXR 11/27/11 IMPRESSION:  COPD. No active disease.  Original Report Authenticated By: Danae Orleans, M.D.   Review of Systems-see HPI Constitutional:   No weight loss, night sweats,  Fevers, chills, fatigue, lassitude. HEENT:   No headaches,  Difficulty swallowing,  Tooth/dental problems,  Sore throat,                No sneezing, itching, ear ache, nasal congestion, post nasal drip,  CV-No chest pain,  Orthopnea, PND, swelling in lower extremities, anasarca, dizziness, palpitations GI  + heartburn, indigestion,  No-abdominal pain, nausea, vomiting,  Resp: + shortness of breath with exertion or at rest.  No excess mucus, + productive cough,  + non-productive cough,  Slight coughing up of blood.  No change in color of mucus.  + wheezing.  Skin: no rash or lesions. GU: no dysuria,  MS:  No acute joint pain or swelling.   + sacral back pain. Psych:  No change in mood or affect. + depression or anxiety.  No memory loss.  Objective:   Physical Exam General- Alert, Oriented, Affect-, calmer than usual but tremulous, Distress- none acute;  portable O2 2 L per minute/ 100%. Very thin, Has died her hair. Skin- rash-none, lesions- none, excoriation- none Lymphadenopathy- none Head- atraumatic            Eyes- Gross vision intact, PERRLA, conjunctivae clear secretions            Ears- Hearing, canals-normal  Nose- Clear, no-Septal dev, mucus, polyps, erosion, perforation             Throat- Mallampati II , mucosa clear , drainage- none, tonsils- atrophic, dry "throat tickle" cough Neck- flexible , trachea midline, no stridor , thyroid nl, carotid no bruit Chest - symmetrical excursion , unlabored           Heart/CV- RRR , no murmur , no gallop  , no rub, nl s1 s2                           - JVD- none , edema- none, stasis changes- none, varices- none           Lung-  clear to P&A, wheeze- none,  , dullness-none, rub- none. Little observed cough           Chest wall-  Abd-  Br/ Gen/ Rectal- Not done, not indicated Extrem- cyanosis- none, clubbing, none, atrophy- none, strength- nl,  contracture of finger Neuro- grossly intact to observation \

## 2012-01-15 ENCOUNTER — Emergency Department (HOSPITAL_COMMUNITY)
Admission: EM | Admit: 2012-01-15 | Discharge: 2012-01-15 | Disposition: A | Discharge status: Home / Self Care | Payer: Medicaid Other | Attending: Emergency Medicine | Admitting: Emergency Medicine

## 2012-01-15 ENCOUNTER — Emergency Department (HOSPITAL_COMMUNITY): Payer: Medicaid Other

## 2012-01-15 ENCOUNTER — Encounter (HOSPITAL_COMMUNITY): Payer: Self-pay | Admitting: Emergency Medicine

## 2012-01-15 DIAGNOSIS — F411 Generalized anxiety disorder: Secondary | ICD-10-CM | POA: Insufficient documentation

## 2012-01-15 DIAGNOSIS — J329 Chronic sinusitis, unspecified: Secondary | ICD-10-CM | POA: Insufficient documentation

## 2012-01-15 DIAGNOSIS — M25579 Pain in unspecified ankle and joints of unspecified foot: Secondary | ICD-10-CM | POA: Insufficient documentation

## 2012-01-15 DIAGNOSIS — F17200 Nicotine dependence, unspecified, uncomplicated: Secondary | ICD-10-CM | POA: Insufficient documentation

## 2012-01-15 DIAGNOSIS — M25476 Effusion, unspecified foot: Secondary | ICD-10-CM | POA: Insufficient documentation

## 2012-01-15 DIAGNOSIS — J4489 Other specified chronic obstructive pulmonary disease: Secondary | ICD-10-CM | POA: Insufficient documentation

## 2012-01-15 DIAGNOSIS — IMO0001 Reserved for inherently not codable concepts without codable children: Secondary | ICD-10-CM | POA: Insufficient documentation

## 2012-01-15 DIAGNOSIS — W19XXXA Unspecified fall, initial encounter: Secondary | ICD-10-CM | POA: Insufficient documentation

## 2012-01-15 DIAGNOSIS — B1920 Unspecified viral hepatitis C without hepatic coma: Secondary | ICD-10-CM | POA: Insufficient documentation

## 2012-01-15 DIAGNOSIS — M25473 Effusion, unspecified ankle: Secondary | ICD-10-CM | POA: Insufficient documentation

## 2012-01-15 DIAGNOSIS — Z79899 Other long term (current) drug therapy: Secondary | ICD-10-CM | POA: Insufficient documentation

## 2012-01-15 DIAGNOSIS — S93409A Sprain of unspecified ligament of unspecified ankle, initial encounter: Secondary | ICD-10-CM

## 2012-01-15 DIAGNOSIS — Y998 Other external cause status: Secondary | ICD-10-CM | POA: Insufficient documentation

## 2012-01-15 DIAGNOSIS — Y92410 Unspecified street and highway as the place of occurrence of the external cause: Secondary | ICD-10-CM | POA: Insufficient documentation

## 2012-01-15 DIAGNOSIS — J449 Chronic obstructive pulmonary disease, unspecified: Secondary | ICD-10-CM | POA: Insufficient documentation

## 2012-01-15 DIAGNOSIS — K219 Gastro-esophageal reflux disease without esophagitis: Secondary | ICD-10-CM | POA: Insufficient documentation

## 2012-01-15 MED ORDER — HYDROCODONE-ACETAMINOPHEN 5-325 MG PO TABS: 1.0000 | ORAL_TABLET | Freq: Four times a day (QID) | ORAL | Status: AC | PRN

## 2012-01-15 MED ORDER — KETOROLAC TROMETHAMINE 60 MG/2ML IM SOLN
60.0000 mg | Freq: Once | INTRAMUSCULAR | Status: AC
  Administered 2012-01-15: 60 mg via INTRAMUSCULAR
  Filled 2012-01-15: qty 2

## 2012-01-15 MED ORDER — HYDROCODONE-ACETAMINOPHEN 5-325 MG PO TABS
1.0000 | ORAL_TABLET | Freq: Once | ORAL | Status: AC
  Administered 2012-01-15: 1 via ORAL
  Filled 2012-01-15: qty 1
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
## 2012-01-15 NOTE — ED Notes (Signed)
Pt refused crutches stating she has a pair

## 2012-01-15 NOTE — ED Provider Notes (Signed)
Medical screening examination/treatment/procedure(s) were performed by non-physician practitioner and as supervising physician I was immediately available for consultation/collaboration.   Deavin Forst, MD 01/15/12 2245 

## 2012-01-15 NOTE — ED Notes (Signed)
Wears 2L O2 at home

## 2012-01-15 NOTE — ED Notes (Signed)
Per EMS: Pt twisted right ankle while walking "to the store to get alcohol." Reports pain in right ankle, is weight bearing. Affirms etoh drinking today. No other complaints.

## 2012-01-15 NOTE — Discharge Instructions (Signed)
The x-rays are normal. Follow up with the orthopedist as needed. Ice and elevate the ankle.

## 2012-02-08 ENCOUNTER — Telehealth: Payer: Self-pay | Admitting: Internal Medicine

## 2012-02-08 MED ORDER — IPRATROPIUM-ALBUTEROL 20-100 MCG/ACT IN AERS: 1.0000 | INHALATION_SPRAY | RESPIRATORY_TRACT | Status: DC | PRN

## 2012-02-08 NOTE — Telephone Encounter (Signed)
Per CY-please ask patient why she feels she needs both inhalers; Insurance will only cover one. Please make a decision which she would like to have. Thanks.

## 2012-02-08 NOTE — Telephone Encounter (Signed)
Pt wants to use the combivent. Per Florentina Addison since [facility] is no longer available call in combivent respimat. RX has been sent to the pharmacy

## 2012-02-08 NOTE — Telephone Encounter (Signed)
Per last OV note pt is using Combivent rescue inhaler several times a day, nebulizer 2 or 3 times a day plus Spiriva. Pt med list has albuterol inhaler and not combivent. I spoke with the pt and she is asking for a refill on combivent and albuterol inhaler. Please advise if ok to send rx for both? Carron Curie, CMA No Known Allergies

## 2012-02-09 ENCOUNTER — Other Ambulatory Visit: Payer: Self-pay | Admitting: Internal Medicine

## 2012-02-09 MED ORDER — IPRATROPIUM-ALBUTEROL 20-100 MCG/ACT IN AERS: 1.0000 | INHALATION_SPRAY | Freq: Four times a day (QID) | RESPIRATORY_TRACT | Status: DC | PRN

## 2012-02-22 ENCOUNTER — Encounter (HOSPITAL_COMMUNITY): Payer: Self-pay | Admitting: Emergency Medicine

## 2012-02-22 ENCOUNTER — Emergency Department (HOSPITAL_COMMUNITY)
Admission: EM | Admit: 2012-02-22 | Discharge: 2012-02-22 | Disposition: A | Discharge status: Home / Self Care | Payer: Medicaid Other | Attending: Emergency Medicine | Admitting: Emergency Medicine

## 2012-02-22 ENCOUNTER — Emergency Department (HOSPITAL_COMMUNITY): Payer: Medicaid Other

## 2012-02-22 DIAGNOSIS — B1920 Unspecified viral hepatitis C without hepatic coma: Secondary | ICD-10-CM | POA: Insufficient documentation

## 2012-02-22 DIAGNOSIS — J449 Chronic obstructive pulmonary disease, unspecified: Secondary | ICD-10-CM | POA: Insufficient documentation

## 2012-02-22 DIAGNOSIS — R0981 Nasal congestion: Secondary | ICD-10-CM

## 2012-02-22 DIAGNOSIS — F411 Generalized anxiety disorder: Secondary | ICD-10-CM | POA: Insufficient documentation

## 2012-02-22 DIAGNOSIS — R059 Cough, unspecified: Secondary | ICD-10-CM | POA: Insufficient documentation

## 2012-02-22 DIAGNOSIS — R062 Wheezing: Secondary | ICD-10-CM

## 2012-02-22 DIAGNOSIS — Z79899 Other long term (current) drug therapy: Secondary | ICD-10-CM | POA: Insufficient documentation

## 2012-02-22 DIAGNOSIS — F17200 Nicotine dependence, unspecified, uncomplicated: Secondary | ICD-10-CM | POA: Insufficient documentation

## 2012-02-22 DIAGNOSIS — R51 Headache: Secondary | ICD-10-CM | POA: Insufficient documentation

## 2012-02-22 DIAGNOSIS — R05 Cough: Secondary | ICD-10-CM

## 2012-02-22 DIAGNOSIS — J3489 Other specified disorders of nose and nasal sinuses: Secondary | ICD-10-CM | POA: Insufficient documentation

## 2012-02-22 DIAGNOSIS — J4489 Other specified chronic obstructive pulmonary disease: Secondary | ICD-10-CM | POA: Insufficient documentation

## 2012-02-22 DIAGNOSIS — K219 Gastro-esophageal reflux disease without esophagitis: Secondary | ICD-10-CM | POA: Insufficient documentation

## 2012-02-22 DIAGNOSIS — IMO0001 Reserved for inherently not codable concepts without codable children: Secondary | ICD-10-CM | POA: Insufficient documentation

## 2012-02-22 DIAGNOSIS — M791 Myalgia, unspecified site: Secondary | ICD-10-CM

## 2012-02-22 MED ORDER — CETIRIZINE-PSEUDOEPHEDRINE ER 5-120 MG PO TB12: 1.0000 | ORAL_TABLET | Freq: Two times a day (BID) | ORAL | Status: DC | PRN

## 2012-02-22 MED ORDER — HYDROCODONE-ACETAMINOPHEN 5-325 MG PO TABS
2.0000 | ORAL_TABLET | Freq: Once | ORAL | Status: AC
  Administered 2012-02-22: 2 via ORAL
  Filled 2012-02-22: qty 2

## 2012-02-22 MED ORDER — ALBUTEROL SULFATE (5 MG/ML) 0.5% IN NEBU
5.0000 mg | INHALATION_SOLUTION | Freq: Once | RESPIRATORY_TRACT | Status: AC
  Administered 2012-02-22: 5 mg via RESPIRATORY_TRACT
  Filled 2012-02-22: qty 40

## 2012-02-22 MED ORDER — ALBUTEROL SULFATE HFA 108 (90 BASE) MCG/ACT IN AERS: 2.0000 | INHALATION_SPRAY | RESPIRATORY_TRACT | Status: DC | PRN

## 2012-02-22 MED ORDER — ALBUTEROL SULFATE HFA 108 (90 BASE) MCG/ACT IN AERS
2.0000 | INHALATION_SPRAY | RESPIRATORY_TRACT | Status: DC | PRN
  Administered 2012-02-22: 2 via RESPIRATORY_TRACT
  Filled 2012-02-22: qty 6.7

## 2012-02-22 MED ORDER — AZITHROMYCIN 250 MG PO TABS: ORAL_TABLET | ORAL | Status: AC
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
## 2012-02-22 NOTE — ED Notes (Signed)
Pt reports seen at PMD office this AM and diagnosed with sinusitus this AM. Pt arrives via EMS because she continues to have right eye pain.

## 2012-02-22 NOTE — ED Notes (Signed)
Pt given soda and pretzels per RNs request.  Pt resting comfortably and says she wants to leave.

## 2012-02-22 NOTE — ED Notes (Signed)
Pt walking in hallway asking for something else for pain and something to eat.

## 2012-02-22 NOTE — ED Notes (Signed)
Dr. Denton Lank back at bedside.

## 2012-03-08 ENCOUNTER — Other Ambulatory Visit: Payer: Self-pay | Admitting: *Deleted

## 2012-03-08 MED ORDER — TIOTROPIUM BROMIDE MONOHYDRATE 18 MCG IN CAPS: 18.0000 ug | ORAL_CAPSULE | Freq: Every day | RESPIRATORY_TRACT | Status: DC

## 2012-03-31 ENCOUNTER — Ambulatory Visit: Payer: Medicaid Other | Admitting: Internal Medicine

## 2012-04-05 ENCOUNTER — Ambulatory Visit: Payer: Medicaid Other | Admitting: Internal Medicine

## 2012-04-11 ENCOUNTER — Other Ambulatory Visit: Payer: Self-pay | Admitting: Internal Medicine

## 2012-04-11 MED ORDER — ALBUTEROL SULFATE HFA 108 (90 BASE) MCG/ACT IN AERS: 2.0000 | INHALATION_SPRAY | RESPIRATORY_TRACT | Status: DC | PRN

## 2012-04-11 NOTE — Telephone Encounter (Signed)
Received refill request for pt's proair - 2 puffs every 4 hours prn wheezing.  Last filled ventolin by ER physician 02-22-12 #1 with 3 refills.  Called [facility], spoke with pharmacist [place] who stated that pt's insurance will only cover proair, but that pt does indeed have a current rx for this as of 03-09-12 when pt picked this up.  [place] asked if pt may have future refills.  Pt last seen 12-24-11, upcoming ov 05-02-12.  3 additional refills given verbally.  MAR updated.

## 2012-04-19 ENCOUNTER — Telehealth: Payer: Self-pay | Admitting: Internal Medicine

## 2012-04-19 DIAGNOSIS — J449 Chronic obstructive pulmonary disease, unspecified: Secondary | ICD-10-CM

## 2012-04-19 DIAGNOSIS — R0602 Shortness of breath: Secondary | ICD-10-CM

## 2012-04-19 NOTE — Telephone Encounter (Signed)
Spoke with patient-she states she is wearing her O2 and also that she needs a new nebulizer machine as the one she has is 53 years old and has broken. Pt uses [facility] for her nebulizer machine. Due to recent passing of her father patient is unable to get to [facility] to pick up and would like to have [facility] bring machine to her; per [facility] charges a delivery fee and patient is aware of this. I will place order and have [facility] call her prior to taking machine to her. Order has been placed.

## 2012-05-02 ENCOUNTER — Ambulatory Visit: Payer: Medicaid Other | Admitting: Internal Medicine

## 2012-05-05 ENCOUNTER — Telehealth: Payer: Self-pay | Admitting: Internal Medicine

## 2012-05-05 MED ORDER — PREDNISONE 20 MG PO TABS: 20.0000 mg | ORAL_TABLET | Freq: Every day | ORAL | Status: DC

## 2012-05-05 MED ORDER — AZITHROMYCIN 250 MG PO TABS: ORAL_TABLET | ORAL | Status: DC

## 2012-05-05 NOTE — Telephone Encounter (Signed)
Ok to offer Z pak and Prednisone 20 mg, # 5, 1 daily

## 2012-05-05 NOTE — Telephone Encounter (Signed)
I spoke with pt and she c/o runny nose, chest congestion, wheezing, chest tx, cough w/ mixed phlem brown and white. She missed her appt bc she had to take care of her sister and forgot about the appt with Dr. Maple Hudson, She is requesting to have something called in for her. Please advise Dr. Maple Hudson thanks  No Known Allergies

## 2012-05-05 NOTE — Telephone Encounter (Signed)
Med sent. Pt is aware.Megan Soto, CMA

## 2012-05-29 ENCOUNTER — Other Ambulatory Visit: Payer: Self-pay | Admitting: Family Medicine

## 2012-05-29 DIAGNOSIS — M545 Low back pain, unspecified: Secondary | ICD-10-CM

## 2012-06-02 ENCOUNTER — Other Ambulatory Visit: Payer: Medicaid Other

## 2012-06-06 ENCOUNTER — Other Ambulatory Visit: Payer: Self-pay | Admitting: *Deleted

## 2012-06-06 MED ORDER — TIOTROPIUM BROMIDE MONOHYDRATE 18 MCG IN CAPS: 18.0000 ug | ORAL_CAPSULE | Freq: Every day | RESPIRATORY_TRACT | Status: DC

## 2012-07-17 ENCOUNTER — Encounter (HOSPITAL_COMMUNITY)
Admission: RE | Admit: 2012-07-17 | Discharge: 2012-07-17 | Disposition: A | Discharge status: Home / Self Care | Payer: Medicaid Other | Source: Ambulatory Visit | Attending: Obstetrics and Gynecology | Admitting: Obstetrics and Gynecology

## 2012-07-17 ENCOUNTER — Other Ambulatory Visit: Payer: Self-pay

## 2012-07-17 ENCOUNTER — Encounter (HOSPITAL_COMMUNITY): Payer: Self-pay

## 2012-07-17 DIAGNOSIS — Z01812 Encounter for preprocedural laboratory examination: Secondary | ICD-10-CM | POA: Insufficient documentation

## 2012-07-17 DIAGNOSIS — Z01818 Encounter for other preprocedural examination: Secondary | ICD-10-CM | POA: Insufficient documentation

## 2012-07-17 HISTORY — DX: Depression, unspecified: F32.A

## 2012-07-17 HISTORY — DX: Major depressive disorder, single episode, unspecified: F32.9

## 2012-07-17 HISTORY — DX: Essential (primary) hypertension: I10

## 2012-07-17 HISTORY — DX: Type 2 diabetes mellitus without complications: E11.9

## 2012-07-17 HISTORY — DX: Unspecified osteoarthritis, unspecified site: M19.90

## 2012-07-17 HISTORY — DX: Shortness of breath: R06.02

## 2012-07-17 LAB — CBC
HCT: 40.6 % (ref 36.0–46.0)
Hemoglobin: 13.2 g/dL (ref 12.0–15.0)
MCH: 30.5 pg (ref 26.0–34.0)
MCHC: 32.5 g/dL (ref 30.0–36.0)
MCV: 93.8 fL (ref 78.0–100.0)
Platelets: 177 10*3/uL (ref 150–400)
RBC: 4.33 MIL/uL (ref 3.87–5.11)
RDW: 14.1 % (ref 11.5–15.5)
WBC: 9.4 10*3/uL (ref 4.0–10.5)

## 2012-07-17 LAB — BASIC METABOLIC PANEL
BUN: 4 mg/dL — ABNORMAL LOW (ref 6–23)
CO2: 28 mEq/L (ref 19–32)
Calcium: 9.6 mg/dL (ref 8.4–10.5)
Chloride: 99 mEq/L (ref 96–112)
Creatinine, Ser: 0.68 mg/dL (ref 0.50–1.10)
GFR calc Af Amer: >90 mL/min (ref >90)
GFR calc non Af Amer: >90 mL/min (ref >90)
Glucose, Bld: 129 mg/dL — ABNORMAL HIGH (ref 70–99)
Potassium: 4.6 mEq/L (ref 3.5–5.1)
Sodium: 138 mEq/L (ref 135–145)

## 2012-07-17 LAB — URINE DRUGS OF ABUSE SCREEN W ALC, ROUTINE (REF LAB)
Amphetamine Screen, Ur: NEGATIVE
Barbiturate Quant, Ur: NEGATIVE
Benzodiazepines.: POSITIVE — AB
Cocaine Metabolites: POSITIVE — AB
Creatinine,U: 142.6 mg/dL
Ethyl Alcohol: <10 mg/dL (ref <10)
Marijuana Metabolite: POSITIVE — AB
Methadone: NEGATIVE
Opiate Screen, Urine: POSITIVE — AB
Phencyclidine (PCP): NEGATIVE
Propoxyphene: NEGATIVE

## 2012-07-17 NOTE — Patient Instructions (Addendum)
   Your procedure is scheduled on: Tuesday, Dec 10  Enter through the [employer] of [facility] at:  11am Pick up the phone at the desk and dial [phone] and inform us of your arrival.  Please call this number if you have any problems the morning of surgery: [phone]  Remember: Do not eat food after midnight: Monday Do not drink clear liquids after: Monday Take these medicines the morning of surgery with a SIP OF WATER: xanax, norco, albuterol, lisinopril, prilosec, s[iriva.  Bring albuterol inhaler with you on day of surgery.  Do not take metformin Tuesday morning.  Do not wear jewelry, make-up, or FINGER nail polish No metal in your hair or on your body. Do not wear lotions, powders, perfumes. You may wear deodorant.  Please use your CHG wash as directed prior to surgery.  Do not shave anywhere for at least 12 hours prior to first CHG shower.  Do not bring valuables to the hospital. Contacts, dentures or bridgework may not be worn into surgery.  Patients discharged on the day of surgery will not be allowed to drive home.  Home with daughter Crystal.

## 2012-07-17 NOTE — Pre-Procedure Instructions (Signed)
Dr Brayton Caves saw this patient at preop appt.  Patient needs to be cleared for surgery By her Primary Dr Windle Guard, ph [phone].  Dr Jeannetta Nap needs to see patient in order to obtain clearance.  EKG and lab results fax to Amy at Dr Hennie Duos office [phone].  Patient instructed to call Amy at Dr Hennie Duos office for appt.

## 2012-07-18 ENCOUNTER — Encounter (HOSPITAL_COMMUNITY): Admission: RE | Payer: Self-pay | Source: Ambulatory Visit

## 2012-07-18 ENCOUNTER — Ambulatory Visit (HOSPITAL_COMMUNITY)
Admission: RE | Admit: 2012-07-18 | Payer: Medicaid Other | Source: Ambulatory Visit | Admitting: Obstetrics and Gynecology

## 2012-07-18 LAB — BENZODIAZEPINE, QUANTITATIVE, URINE
Alprazolam (GC/LC/MS), ur confirm: 1462 ng/mL
Alprazolam metabolite (GC/LC/MS), ur confirm: 833 ng/mL
Clonazepam metabolite (GC/LC/MS), ur confirm: NEGATIVE ng/mL
Diazepam (GC/LC/MS), ur confirm: NEGATIVE ng/mL
Estazolam (GC/LC/MS), ur confirm: NEGATIVE ng/mL
Flunitrazepam metabolite (GC/LC/MS), ur confirm: NEGATIVE ng/mL
Flurazepam GC/MS Conf: NEGATIVE ng/mL
Lorazepam (GC/LC/MS), ur confirm: NEGATIVE ng/mL
Midazolam (GC/LC/MS), ur confirm: NEGATIVE ng/mL
Nordiazepam GC/MS Conf: 67 ng/mL
Oxazepam GC/MS Conf: 201 ng/mL
Temazepam GC/MS Conf: 85 ng/mL
Triazolam metabolite (GC/LC/MS), ur confirm: NEGATIVE ng/mL

## 2012-07-18 LAB — OPIATE, QUANTITATIVE, URINE
6 Monoacetylmorphine, Ur-Confirm: NEGATIVE ng/mL
Codeine Urine: 53 ng/mL
Hydrocodone: 6051 ng/mL
Hydromorphone GC/MS Conf: 860 ng/mL
Morphine, Confirm: NEGATIVE ng/mL
Norhydrocodone, Ur: 1673 ng/mL
Noroxycodone, Ur: NEGATIVE ng/mL
Oxycodone, ur: NEGATIVE ng/mL
Oxymorphone: NEGATIVE ng/mL

## 2012-07-18 LAB — COCAINE, URINE, CONFIRMATION: Benzoylecgonine GC/MS Conf: 427 ng/mL

## 2012-07-18 LAB — THC (MARIJUANA), URINE, CONFIRMATION: Marijuana, Ur-Confirmation: 43 ng/mL

## 2012-07-18 SURGERY — DILATATION AND CURETTAGE /HYSTEROSCOPY
Anesthesia: Choice

## 2012-07-20 ENCOUNTER — Other Ambulatory Visit: Payer: Self-pay | Admitting: Obstetrics and Gynecology

## 2012-07-24 ENCOUNTER — Encounter (HOSPITAL_COMMUNITY): Payer: Self-pay | Admitting: Pharmacist
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
## 2012-08-04 ENCOUNTER — Ambulatory Visit (HOSPITAL_COMMUNITY): Payer: Medicaid Other | Admitting: Anesthesiology

## 2012-08-04 ENCOUNTER — Encounter (HOSPITAL_COMMUNITY): Payer: Self-pay | Admitting: Anesthesiology

## 2012-08-04 ENCOUNTER — Encounter (HOSPITAL_COMMUNITY): Payer: Self-pay | Admitting: *Deleted

## 2012-08-04 ENCOUNTER — Ambulatory Visit (HOSPITAL_COMMUNITY)
Admission: RE | Admit: 2012-08-04 | Discharge: 2012-08-04 | Disposition: A | Discharge status: Home / Self Care | Payer: Medicaid Other | Source: Ambulatory Visit | Attending: Obstetrics and Gynecology | Admitting: Obstetrics and Gynecology

## 2012-08-04 ENCOUNTER — Encounter (HOSPITAL_COMMUNITY)
Admission: RE | Disposition: A | Discharge status: Home / Self Care | Payer: Self-pay | Source: Ambulatory Visit | Attending: Obstetrics and Gynecology

## 2012-08-04 DIAGNOSIS — R87619 Unspecified abnormal cytological findings in specimens from cervix uteri: Secondary | ICD-10-CM | POA: Insufficient documentation

## 2012-08-04 DIAGNOSIS — Z01818 Encounter for other preprocedural examination: Secondary | ICD-10-CM | POA: Insufficient documentation

## 2012-08-04 DIAGNOSIS — Z01812 Encounter for preprocedural laboratory examination: Secondary | ICD-10-CM | POA: Insufficient documentation

## 2012-08-04 HISTORY — PX: HYSTEROSCOPY WITH D & C: SHX1775

## 2012-08-04 HISTORY — PX: CERVICAL CONIZATION W/BX: SHX1330

## 2012-08-04 LAB — CBC
HCT: 44.7 % (ref 36.0–46.0)
Hemoglobin: 14.9 g/dL (ref 12.0–15.0)
MCH: 30.8 pg (ref 26.0–34.0)
MCHC: 33.3 g/dL (ref 30.0–36.0)
MCV: 92.4 fL (ref 78.0–100.0)
Platelets: 349 K/uL (ref 150–400)
RBC: 4.84 MIL/uL (ref 3.87–5.11)
RDW: 14.6 % (ref 11.5–15.5)
WBC: 12.1 K/uL — ABNORMAL HIGH (ref 4.0–10.5)

## 2012-08-04 LAB — RAPID URINE DRUG SCREEN, HOSP PERFORMED
Amphetamines: NOT DETECTED
Barbiturates: NOT DETECTED
Benzodiazepines: NOT DETECTED
Cocaine: NOT DETECTED
Opiates: POSITIVE — AB
Tetrahydrocannabinol: NOT DETECTED

## 2012-08-04 LAB — COMPREHENSIVE METABOLIC PANEL
ALT: 17 U/L (ref 0–35)
AST: 17 U/L (ref 0–37)
Albumin: 3.9 g/dL (ref 3.5–5.2)
Alkaline Phosphatase: 81 U/L (ref 39–117)
BUN: 9 mg/dL (ref 6–23)
CO2: 29 mEq/L (ref 19–32)
Calcium: 9.8 mg/dL (ref 8.4–10.5)
Chloride: 100 mEq/L (ref 96–112)
Creatinine, Ser: 0.78 mg/dL (ref 0.50–1.10)
GFR calc Af Amer: >90 mL/min (ref >90)
GFR calc non Af Amer: >90 mL/min (ref >90)
Glucose, Bld: 118 mg/dL — ABNORMAL HIGH (ref 70–99)
Potassium: 4 mEq/L (ref 3.5–5.1)
Sodium: 138 mEq/L (ref 135–145)
Total Bilirubin: 0.3 mg/dL (ref 0.3–1.2)
Total Protein: 7.3 g/dL (ref 6.0–8.3)

## 2012-08-04 LAB — URINALYSIS, ROUTINE W REFLEX MICROSCOPIC
Bilirubin Urine: NEGATIVE
Glucose, UA: NEGATIVE mg/dL
Hgb urine dipstick: NEGATIVE
Ketones, ur: NEGATIVE mg/dL
Leukocytes, UA: NEGATIVE
Nitrite: NEGATIVE
Protein, ur: NEGATIVE mg/dL
Specific Gravity, Urine: 1.02 (ref 1.005–1.030)
Urobilinogen, UA: 0.2 mg/dL (ref 0.0–1.0)
pH: 6 (ref 5.0–8.0)

## 2012-08-04 LAB — PROTIME-INR
INR: 0.95 (ref 0.00–1.49)
Prothrombin Time: 12.6 s (ref 11.6–15.2)

## 2012-08-04 LAB — APTT: aPTT: 30 s (ref 24–37)

## 2012-08-04 LAB — GLUCOSE, CAPILLARY: Glucose-Capillary: 153 mg/dL — ABNORMAL HIGH (ref 70–99)

## 2012-08-04 SURGERY — DILATATION AND CURETTAGE /HYSTEROSCOPY
Anesthesia: General | Site: Uterus | Wound class: Clean Contaminated

## 2012-08-04 MED ORDER — FENTANYL CITRATE 0.05 MG/ML IJ SOLN
INTRAMUSCULAR | Status: DC | PRN
  Administered 2012-08-04 (×3): 100 ug via INTRAVENOUS

## 2012-08-04 MED ORDER — ONDANSETRON HCL 4 MG/2ML IJ SOLN: 4.0000 mg | Freq: Once | INTRAMUSCULAR | Status: DC | PRN

## 2012-08-04 MED ORDER — MEPERIDINE HCL 25 MG/ML IJ SOLN: 6.2500 mg | INTRAMUSCULAR | Status: DC | PRN

## 2012-08-04 MED ORDER — LACTATED RINGERS IV SOLN
INTRAVENOUS | Status: DC | PRN
  Administered 2012-08-04: 07:00:00 via INTRAVENOUS

## 2012-08-04 MED ORDER — ALBUTEROL SULFATE HFA 108 (90 BASE) MCG/ACT IN AERS
INHALATION_SPRAY | RESPIRATORY_TRACT | Status: AC
  Filled 2012-08-04: qty 6.7

## 2012-08-04 MED ORDER — CEFAZOLIN SODIUM-DEXTROSE 2-3 GM-% IV SOLR
2.0000 g | INTRAVENOUS | Status: AC
  Administered 2012-08-04: 2 g via INTRAVENOUS

## 2012-08-04 MED ORDER — DEXAMETHASONE SODIUM PHOSPHATE 10 MG/ML IJ SOLN
INTRAMUSCULAR | Status: AC
  Filled 2012-08-04: qty 1

## 2012-08-04 MED ORDER — DEXAMETHASONE SODIUM PHOSPHATE 10 MG/ML IJ SOLN
INTRAMUSCULAR | Status: DC | PRN
  Administered 2012-08-04: 10 mg via INTRAVENOUS

## 2012-08-04 MED ORDER — ONDANSETRON HCL 4 MG/2ML IJ SOLN
INTRAMUSCULAR | Status: DC | PRN
  Administered 2012-08-04: 4 mg via INTRAVENOUS

## 2012-08-04 MED ORDER — FENTANYL CITRATE 0.05 MG/ML IJ SOLN: 25.0000 ug | INTRAMUSCULAR | Status: DC | PRN

## 2012-08-04 MED ORDER — LIDOCAINE HCL (CARDIAC) 20 MG/ML IV SOLN
INTRAVENOUS | Status: DC | PRN
  Administered 2012-08-04: 50 mg via INTRAVENOUS

## 2012-08-04 MED ORDER — FENTANYL CITRATE 0.05 MG/ML IJ SOLN
INTRAMUSCULAR | Status: AC
  Filled 2012-08-04: qty 2

## 2012-08-04 MED ORDER — IODINE STRONG (LUGOLS) 5 % PO SOLN
ORAL | Status: DC | PRN
  Administered 2012-08-04: 0.1 mL

## 2012-08-04 MED ORDER — PROMETHAZINE HCL 25 MG RE SUPP
RECTAL | Status: AC
  Administered 2012-08-04: 25 mg via RECTAL
  Filled 2012-08-04: qty 1

## 2012-08-04 MED ORDER — LIDOCAINE-EPINEPHRINE 1 %-1:100000 IJ SOLN
INTRAMUSCULAR | Status: DC | PRN
  Administered 2012-08-04: 8 mL

## 2012-08-04 MED ORDER — CEFAZOLIN SODIUM-DEXTROSE 2-3 GM-% IV SOLR
INTRAVENOUS | Status: AC
  Filled 2012-08-04: qty 50

## 2012-08-04 MED ORDER — PROMETHAZINE HCL 25 MG RE SUPP
25.0000 mg | RECTAL | Status: DC | PRN
  Administered 2012-08-04: 25 mg via RECTAL

## 2012-08-04 MED ORDER — ONDANSETRON HCL 4 MG/2ML IJ SOLN
INTRAMUSCULAR | Status: AC
  Filled 2012-08-04: qty 2

## 2012-08-04 MED ORDER — PROPOFOL 10 MG/ML IV EMUL
INTRAVENOUS | Status: DC | PRN
  Administered 2012-08-04: 100 mg via INTRAVENOUS

## 2012-08-04 MED ORDER — PROPOFOL 10 MG/ML IV EMUL
INTRAVENOUS | Status: AC
  Filled 2012-08-04: qty 20

## 2012-08-04 MED ORDER — MIDAZOLAM HCL 2 MG/2ML IJ SOLN
INTRAMUSCULAR | Status: AC
  Filled 2012-08-04: qty 2

## 2012-08-04 MED ORDER — LIDOCAINE HCL (CARDIAC) 20 MG/ML IV SOLN
INTRAVENOUS | Status: AC
  Filled 2012-08-04: qty 5

## 2012-08-04 MED ORDER — GLYCINE 1.5 % IR SOLN
Status: DC | PRN
  Administered 2012-08-04: 3000 mL

## 2012-08-04 MED ORDER — MIDAZOLAM HCL 5 MG/5ML IJ SOLN
INTRAMUSCULAR | Status: DC | PRN
  Administered 2012-08-04: 2 mg via INTRAVENOUS

## 2012-08-04 MED ORDER — LACTATED RINGERS IV SOLN
INTRAVENOUS | Status: DC
  Administered 2012-08-04: 07:00:00 via INTRAVENOUS

## 2012-08-04 SURGICAL SUPPLY — 29 items
APPLICATOR COTTON TIP 6IN STRL (MISCELLANEOUS) ×3 IMPLANT
BLADE SURG 11 STRL SS (BLADE) ×3 IMPLANT
CANISTER SUCTION 2500CC (MISCELLANEOUS) ×3 IMPLANT
CATH ROBINSON RED A/P 16FR (CATHETERS) ×3 IMPLANT
CLEANER TIP ELECTROSURG 2X2 (MISCELLANEOUS) ×4 IMPLANT
CLOTH BEACON ORANGE TIMEOUT ST (SAFETY) ×3 IMPLANT
CONTAINER PREFILL 10% NBF 60ML (FORM) ×7 IMPLANT
COUNTER NEEDLE 1200 MAGNETIC (NEEDLE) ×1 IMPLANT
DRESSING TELFA 8X3 (GAUZE/BANDAGES/DRESSINGS) ×3 IMPLANT
ELECT BALL LEEP 5MM RED (ELECTRODE) ×1 IMPLANT
ELECT REM PT RETURN 9FT ADLT (ELECTROSURGICAL) ×3
ELECTRODE REM PT RTRN 9FT ADLT (ELECTROSURGICAL) ×2 IMPLANT
GLOVE BIO SURGEON STRL SZ7.5 (GLOVE) ×6 IMPLANT
GOWN PREVENTION PLUS XXLARGE (GOWN DISPOSABLE) ×3 IMPLANT
GOWN STRL REIN XL XLG (GOWN DISPOSABLE) ×6 IMPLANT
NS IRRIG 1000ML POUR BTL (IV SOLUTION) ×3 IMPLANT
PACK HYSTEROSCOPY LF (CUSTOM PROCEDURE TRAY) ×3 IMPLANT
PACK VAGINAL MINOR WOMEN LF (CUSTOM PROCEDURE TRAY) ×3 IMPLANT
PAD OB MATERNITY 4.3X12.25 (PERSONAL CARE ITEMS) ×3 IMPLANT
PENCIL BUTTON HOLSTER BLD 10FT (ELECTRODE) ×3 IMPLANT
SCOPETTES 8  STERILE (MISCELLANEOUS) ×2
SCOPETTES 8 STERILE (MISCELLANEOUS) ×4 IMPLANT
SPONGE SURGIFOAM ABS GEL 12-7 (HEMOSTASIS) ×1 IMPLANT
SUT CHROMIC 0 CT 1 (SUTURE) ×6 IMPLANT
SUT VIC AB 3-0 X1 27 (SUTURE) ×3 IMPLANT
TOWEL OR 17X24 6PK STRL BLUE (TOWEL DISPOSABLE) ×6 IMPLANT
TUBING CONNECTING 10 (TUBING) ×3 IMPLANT
WATER STERILE IRR 1000ML POUR (IV SOLUTION) ×3 IMPLANT
YANKAUER SUCT BULB TIP NO VENT (SUCTIONS) ×3 IMPLANT

## 2012-08-04 NOTE — Progress Notes (Signed)
Pt bp elevated Dr Arby Barrette made aware, no new orders given, okay to discharge pt home. Pt requesting more pain medication for home and something for sleep.  Dr Tenny Craw called with request, Dr Tenny Craw states pt can have Tylenol at home.  Pt is a pain clinic patient and he does not want to give more medication. Informed pt and she was very angry.

## 2012-08-04 NOTE — Anesthesia Postprocedure Evaluation (Signed)
Anesthesia Post Note  Patient: Megan Soto  Procedure(s) Performed: Procedure(s) (LRB): DILATATION AND CURETTAGE /HYSTEROSCOPY (N/A) CONIZATION CERVIX WITH BIOPSY (N/A)  Anesthesia type: General  Patient location: PACU  Post pain: Pain level controlled  Post assessment: Post-op Vital signs reviewed  Last Vitals:  Filed Vitals:   08/04/12 0830  BP: 162/92  Pulse: 92  Temp: 36.4 C  Resp: 30    Post vital signs: Reviewed  Level of consciousness: sedated  Complications: No apparent anesthesia complicationsfj

## 2012-08-04 NOTE — Anesthesia Preprocedure Evaluation (Addendum)
Anesthesia Evaluation  Patient identified by MRN, date of birth, ID band Patient awake    Reviewed: Allergy & Precautions, H&P , NPO status , Patient's Chart, lab work & pertinent test results, reviewed documented beta blocker date and time   History of Anesthesia Complications Negative for: history of anesthetic complications  Airway Mallampati: I      Dental  (+) Teeth Intact and Poor Dentition   Pulmonary shortness of breath, with exertion, at rest and Long-Term Oxygen Therapy, COPD ("left my O2 tank in the car") COPD inhaler and oxygen dependent, Current Smoker,    Pulmonary exam normal       Cardiovascular Exercise Tolerance: Poor hypertension, On Medications     Neuro/Psych negative neurological ROS  negative psych ROS   GI/Hepatic GERD-  Medicated,(+)     substance abuse (arrived to PAT visit acutely cocaine and alcohol intoxicated on 12/9 and case was cancelled - will  repeat labs this am)  alcohol use, cocaine use and marijuana use, Hepatitis -, C  Endo/Other  diabetes, Type 2, Oral Hypoglycemic Agents  Renal/GU      Musculoskeletal  (+) Fibromyalgia -  Abdominal Normal abdominal exam  (+)   Peds  Hematology negative hematology ROS (+)   Anesthesia Other Findings   Reproductive/Obstetrics negative OB ROS                          Anesthesia Physical Anesthesia Plan  ASA: III  Anesthesia Plan: General   Post-op Pain Management:    Induction: Intravenous  Airway Management Planned: LMA  Additional Equipment:   Intra-op Plan:   Post-operative Plan:   Informed Consent: I have reviewed the patients History and Physical, chart, labs and discussed the procedure including the risks, benefits and alternatives for the proposed anesthesia with the patient or authorized representative who has indicated his/her understanding and acceptance.     Plan Discussed with: CRNA and  Surgeon  Anesthesia Plan Comments:        Anesthesia Quick Evaluation

## 2012-08-04 NOTE — Progress Notes (Addendum)
Pt states that if Dr. Tenny Craw will not give her pain medication she will have to go out and find it on her own.  Instructed pt that she should go by or call the pain clinic to get more pain medication. Reminded pt that she has a contract with the pain clinic and they should help her with her pain medication, should she require anything stronger that tylenol. Discharge instructions reviewed with patient and pts daughter, emphasis placed on pt not drinking alcohol due to anesthesia. Discussed pts concern about pain medication with daughter, encouraging pain clinic follow up.  Pt states her significant other at home is an alcoholic and will not be able to help her today.  Asked daughter to be available and check on pt frequently today, daughter agreed. Encouraged rest and not to "overdo" it today so that she will not require extra pain medication. Dr Arby Barrette by to see patient and check on bp and pain.  Pt very anxious to leave now and states she wants to go smoke.  Pt slightly nauseated, Phenergan suppository inserted and pt discharged home with daughter.

## 2012-08-04 NOTE — H&P (Signed)
  Status unchanged will proceed with planned procedure. 

## 2012-08-04 NOTE — Transfer of Care (Signed)
Immediate Anesthesia Transfer of Care Note  Patient: Megan Soto  Procedure(s) Performed: Procedure(s) (LRB) with comments: DILATATION AND CURETTAGE /HYSTEROSCOPY (N/A) CONIZATION CERVIX WITH BIOPSY (N/A)  Patient Location: PACU  Anesthesia Type:General  Level of Consciousness: awake, alert , oriented and patient cooperative  Airway & Oxygen Therapy: Patient Spontanous Breathing and Patient connected to nasal cannula oxygen  Post-op Assessment: Report given to PACU RN, Post -op Vital signs reviewed and stable and Patient moving all extremities X 4  Post vital signs: Reviewed and stable  Complications: No apparent anesthesia complications

## 2012-08-04 NOTE — Brief Op Note (Signed)
08/04/2012  8:01 AM  PATIENT:  Megan Soto  51 y.o. female  PRE-OPERATIVE DIAGNOSIS:  Atypical Glandular Cells on Pap Smear  POST-OPERATIVE DIAGNOSIS:  Atypical Glandular Cells on Pap Smear  PROCEDURE:  Procedure(s) (LRB) with comments: DILATATION AND CURETTAGE /HYSTEROSCOPY (N/A) CONIZATION CERVIX WITH BIOPSY (N/A)  SURGEON:  Surgeon(s) and Role:    * Miguel Aschoff, MD - Primary  ANESTHESIA:   general  EBL:     BLOOD ADMINISTERED:none  DRAINS: none   LOCAL MEDICATIONS USED:  LIDOCAINE   SPECIMEN:  Source of Specimen:  1.Uterine currettings, 2.ECC 3.Cone biopsy  DISPOSITION OF SPECIMEN:  PATHOLOGY  COUNTS:  YES  TOURNIQUET:  * No tourniquets in log *  DICTATION: .Other Dictation: Dictation Number [id number]  PLAN OF CARE: Discharge to home after PACU  PATIENT DISPOSITION:  PACU - hemodynamically stable.   Delay start of Pharmacological VTE agent (>24hrs) due to surgical blood loss or risk of bleeding: not applicable

## 2012-08-04 NOTE — Op Note (Signed)
NAME:  Megan Soto, ZANI NO.:  [id number]  MEDICAL RECORD NO.:  [id number]  LOCATION:  [facility]                          FACILITY:  [facility]  PHYSICIAN:  Miguel Aschoff, M.D.       DATE OF BIRTH:  [birth year]  DATE OF PROCEDURE:  08/04/2012 DATE OF DISCHARGE:                              OPERATIVE REPORT   PREOPERATIVE DIAGNOSIS:  Atypical glandular cells of undetermined significance on Pap smear.  POSTOPERATIVE DIAGNOSIS:  Atypical glandular cells of undetermined significance on Pap smear.  PROCEDURES:  Cervical dilatation, hysteroscopy, uterine curettage, endocervical curettage, cone biopsy.  SURGEON:  Miguel Aschoff, MD  ANESTHESIA:  General.  COMPLICATIONS:  None.  JUSTIFICATION:  The patient is a 51 year old white female noted on a Pap smear to have atypical glandular cells of undetermined significance. Because of the possibility that this represents an occult malignancy involving the endocervix and endometrium, it was felt that cone biopsy was indicated as well as hysteroscopy and uterine curettage.  The risks and benefits of the procedure were discussed with the patient.  Informed consent has been obtained.  PROCEDURE:  The patient was taken to the operating room, placed in a supine position.  General anesthesia was administered without difficulty.  She was then placed in a dorsal lithotomy position, prepped and draped in usual sterile fashion.  Bladder was catheterized. Examination under anesthesia revealed normal external genitalia.  Normal Bartholin's, Skene's glands.  Normal urethra.  The vaginal vault was without gross lesion but was somewhat atrophic.  The cervix was without gross lesion.  The uterus was noted to be small, anterior, normal size and shape.  At this point, a weighted speculum was placed in the vaginal vault.  Anterior cervical lip was grasped with a tenaculum and then stained with Lugol solution.  Lugol solution was taken up diffusely over the  cervix.  There were no areas of poor Lugol's uptake noted.  Once this was done, figure-of-eight sutures of 0 Vicryl were placed from the 2 o' clock to 4 o' clock positions and from the 8 o' clock to 10 o' clock positions.  The sutures were tied and held for hemostasis and retraction.  After this was done, the cervix was injected with total of 18 mL of 1% Xylocaine with epinephrine 1:200,000.  Following this, the endocervical canal was dilated using serial Pratt dilators.  This was continued until a #23 Pratt dilator could be passed.  Then, the diagnostic hysteroscope was advanced through the endocervix.  There was some question of an endocervical lesion noted on the 9 o'clock position. The endometrial cavity was atrophic.  No polyps or submucous myomas were noted.  After this was done, the hysteroscope was removed.  Curettage was carried out yielding only a scant amount of tissue but this was consistent with the atrophic findings of the endometrial cavity.  The endocervical canal was then curetted.  Following the endocervical curettage, a cone of tissue was cut excising the endocervical canal. This was tagged at the 3 o'clock position and sent for histologic evaluation.  The bed of cone biopsy site was then cauterized with electrocautery.  Once good hemostasis was achieved, a Gelfoam pack was  placed into the defect and then previously placed sutures were tied across the cervix to hold the Gelfoam in place.  The estimated blood losswas minimal.  The patient was reversed from the anesthetic and taken to recovery room in satisfactory condition.  Plan is for the patient to be discharged home.  Medications for home include Ultram 50 mg 3 times a day as needed for pain and doxycycline 1 twice a day x3 days.  The patient is to call for any problems such as fever, pain, or heavy bleeding.  She is to call for pathology report  on June 08, 2012.     Miguel Aschoff, M.D.     AR/MEDQ  D:   08/04/2012  T:  08/04/2012  Job:  [id number]

## 2012-08-04 NOTE — Progress Notes (Signed)
Called patient at home to check on her.  She states she is doing okay but getting a little bloated in her belly. I asked what she was drinking and she said Diet cheerwine.  I discouraged use of a straw, encouraged ambulation to pass gas, and instructed her to leave drinks open to flatten out the carbonation fizz.  Pt states she has taken a tylenol for pain and no antibiotic yet today.  I asked if her daughter was checking on her and she stated she would be later.  Encouraged continued rest and follow up with Dr Tenny Craw should she need anything.

## 2012-08-07 ENCOUNTER — Encounter (HOSPITAL_COMMUNITY): Payer: Self-pay | Admitting: Obstetrics and Gynecology

## 2012-08-08 ENCOUNTER — Other Ambulatory Visit: Payer: Self-pay | Admitting: *Deleted

## 2012-08-08 MED ORDER — ALBUTEROL SULFATE HFA 108 (90 BASE) MCG/ACT IN AERS: 2.0000 | INHALATION_SPRAY | RESPIRATORY_TRACT | Status: DC | PRN

## 2012-09-04 ENCOUNTER — Telehealth: Payer: Self-pay | Admitting: Internal Medicine

## 2012-09-04 MED ORDER — PROMETHAZINE HCL 25 MG RE SUPP: 25.0000 mg | Freq: Three times a day (TID) | RECTAL | Status: DC | PRN

## 2012-09-04 MED ORDER — ALBUTEROL SULFATE HFA 108 (90 BASE) MCG/ACT IN AERS: 2.0000 | INHALATION_SPRAY | RESPIRATORY_TRACT | Status: DC | PRN

## 2012-09-04 NOTE — Telephone Encounter (Signed)
Patient states she has been feeling sick on her stomach and having some nausea.  Patient requesting a rx for phenergan be sent into her pharmacy. Dr. Maple Hudson please advise  Patient requesting emergency inhaler be sent into verified pharmacy.  This has been done patient aware.  Patient has been set up for OV on Sep 22, 2012 at 245pm

## 2012-09-04 NOTE — Telephone Encounter (Signed)
Per CY-phenergan 25 mg rectal supp #6 1 every 8 hours prn no refills.

## 2012-09-04 NOTE — Telephone Encounter (Signed)
Pt is aware of CY recs.  Rx has been called in.

## 2012-09-13 ENCOUNTER — Telehealth: Payer: Self-pay | Admitting: Internal Medicine

## 2012-09-13 NOTE — Telephone Encounter (Signed)
Spoke with pharmacy -aware that I have faxed back the request to change the QTY to #2 based on directions(and insurance will cover); pharmacy states they never got fax so I gave verbal to make the change. Pt is aware that Rx issues have been handled and she can go to pharmacy to pick up. Nothing more needed at this time.

## 2012-09-21 ENCOUNTER — Other Ambulatory Visit: Payer: Medicaid Other

## 2012-09-22 ENCOUNTER — Ambulatory Visit: Payer: Medicaid Other | Admitting: Internal Medicine

## 2012-09-28 ENCOUNTER — Ambulatory Visit: Payer: Medicaid Other | Admitting: Internal Medicine

## 2012-10-17 ENCOUNTER — Other Ambulatory Visit: Payer: Medicaid Other

## 2012-10-30 ENCOUNTER — Ambulatory Visit
Admission: RE | Admit: 2012-10-30 | Discharge: 2012-10-30 | Disposition: A | Discharge status: Home / Self Care | Payer: Medicaid Other | Source: Ambulatory Visit | Attending: Family Medicine | Admitting: Family Medicine

## 2012-10-30 DIAGNOSIS — M545 Low back pain, unspecified: Secondary | ICD-10-CM

## 2012-10-31 ENCOUNTER — Ambulatory Visit: Payer: Medicaid Other | Admitting: Internal Medicine

## 2012-11-02 ENCOUNTER — Telehealth: Payer: Self-pay | Admitting: Internal Medicine

## 2012-11-02 ENCOUNTER — Ambulatory Visit: Payer: Medicaid Other | Admitting: Internal Medicine

## 2012-11-02 MED ORDER — IPRATROPIUM-ALBUTEROL 20-100 MCG/ACT IN AERS: 1.0000 | INHALATION_SPRAY | Freq: Four times a day (QID) | RESPIRATORY_TRACT | Status: DC | PRN

## 2012-11-02 MED ORDER — TIOTROPIUM BROMIDE MONOHYDRATE 18 MCG IN CAPS: 18.0000 ug | ORAL_CAPSULE | Freq: Every day | RESPIRATORY_TRACT | Status: DC

## 2012-11-02 NOTE — Telephone Encounter (Signed)
Patient states that she missed her 2 appts with CY d/t transportation. (not showing up on time or at all both days) Patient had appt again today and the bus never showed up until today at 1pm--patient requested to come on in this afternoon since the bus had finally arrived and she was refused an appt with CY this afternoon (per patient).  Pt requesting refills on Spiriva and Combivent to last her until CY next available appt (12/14/12 330p). Pt requests that if there are any cancellations between now and May please let her come in sooner.   Dr Maple Hudson please advise if okay to make these refills and if you have any closer appts to offer this patient. Thanks.

## 2012-11-02 NOTE — Telephone Encounter (Signed)
Refill sent and pt is aware. [name], CMA  

## 2012-11-02 NOTE — Telephone Encounter (Signed)
Per CY-okay to send refills for Spiriva and Combivent-ONLY enough to last her til her appointment in May-she must keep this appointment.

## 2012-11-21 ENCOUNTER — Other Ambulatory Visit: Payer: Self-pay | Admitting: Family Medicine

## 2012-11-21 DIAGNOSIS — Z1231 Encounter for screening mammogram for malignant neoplasm of breast: Secondary | ICD-10-CM

## 2012-12-14 ENCOUNTER — Ambulatory Visit: Payer: Medicaid Other | Admitting: Internal Medicine

## 2012-12-14 ENCOUNTER — Telehealth: Payer: Self-pay | Admitting: Internal Medicine

## 2012-12-14 MED ORDER — ALBUTEROL SULFATE HFA 108 (90 BASE) MCG/ACT IN AERS: 2.0000 | INHALATION_SPRAY | RESPIRATORY_TRACT | Status: DC | PRN

## 2012-12-14 NOTE — Telephone Encounter (Signed)
Pt called back and she is aware of rx sent in to her pharmacy and pt will try to keep her next ov. Nothing further is needed.

## 2012-12-14 NOTE — Telephone Encounter (Signed)
Pt is having issues with transportation and had to reschedule her appt with CY today.  Called and spoke with pt and she was very upset and stated that the bus that brings her to the office keeps messing her times up and that is why she has to keep rescheduling.  Pt is requesting that a refill of her rescue inhaler be sent in to her pharmacy.  Pt did reschedule her appt for 12/19/2012.  CY please advise if we can send in refills for the pt.  Thanks  Allergies  Allergen Reactions  . Aspirin Nausea And Vomiting

## 2012-12-14 NOTE — Telephone Encounter (Signed)
Per CY-okay to give refill on rescue inhaler.

## 2012-12-14 NOTE — Telephone Encounter (Signed)
ATC patient, no answer [id number] 

## 2012-12-18 ENCOUNTER — Ambulatory Visit: Payer: Medicaid Other

## 2012-12-19 ENCOUNTER — Ambulatory Visit: Payer: Medicaid Other | Admitting: Internal Medicine

## 2012-12-28 ENCOUNTER — Ambulatory Visit: Payer: Medicaid Other

## 2013-01-05 ENCOUNTER — Telehealth: Payer: Self-pay | Admitting: Internal Medicine

## 2013-01-05 MED ORDER — TIOTROPIUM BROMIDE MONOHYDRATE 18 MCG IN CAPS: 18.0000 ug | ORAL_CAPSULE | Freq: Every day | RESPIRATORY_TRACT | Status: DC

## 2013-01-05 NOTE — Telephone Encounter (Signed)
I spoke with pt spouse and maed him aware rx was sent. Nothing further was needed

## 2013-01-10 ENCOUNTER — Telehealth: Payer: Self-pay | Admitting: Internal Medicine

## 2013-01-10 ENCOUNTER — Encounter: Payer: Self-pay | Admitting: Internal Medicine

## 2013-01-10 ENCOUNTER — Ambulatory Visit (INDEPENDENT_AMBULATORY_CARE_PROVIDER_SITE_OTHER)
Admission: RE | Admit: 2013-01-10 | Discharge: 2013-01-10 | Disposition: A | Discharge status: Home / Self Care | Payer: Medicaid Other | Source: Ambulatory Visit | Attending: Internal Medicine | Admitting: Internal Medicine

## 2013-01-10 ENCOUNTER — Ambulatory Visit (INDEPENDENT_AMBULATORY_CARE_PROVIDER_SITE_OTHER): Payer: Medicaid Other | Admitting: Internal Medicine

## 2013-01-10 VITALS — BP 118/80 | HR 67 | Ht 61.5 in | Wt 114.0 lb

## 2013-01-10 DIAGNOSIS — R29818 Other symptoms and signs involving the nervous system: Secondary | ICD-10-CM

## 2013-01-10 DIAGNOSIS — J449 Chronic obstructive pulmonary disease, unspecified: Secondary | ICD-10-CM

## 2013-01-10 DIAGNOSIS — J4489 Other specified chronic obstructive pulmonary disease: Secondary | ICD-10-CM

## 2013-01-10 DIAGNOSIS — F411 Generalized anxiety disorder: Secondary | ICD-10-CM

## 2013-01-10 DIAGNOSIS — J439 Emphysema, unspecified: Secondary | ICD-10-CM

## 2013-01-10 DIAGNOSIS — F17200 Nicotine dependence, unspecified, uncomplicated: Secondary | ICD-10-CM

## 2013-01-10 DIAGNOSIS — J438 Other emphysema: Secondary | ICD-10-CM

## 2013-01-10 MED ORDER — ALBUTEROL SULFATE HFA 108 (90 BASE) MCG/ACT IN AERS: 2.0000 | INHALATION_SPRAY | RESPIRATORY_TRACT | Status: DC | PRN

## 2013-01-10 MED ORDER — IPRATROPIUM-ALBUTEROL 20-100 MCG/ACT IN AERS: 1.0000 | INHALATION_SPRAY | Freq: Four times a day (QID) | RESPIRATORY_TRACT | Status: DC | PRN

## 2013-01-10 MED ORDER — PROMETHAZINE HCL 25 MG RE SUPP: 25.0000 mg | Freq: Three times a day (TID) | RECTAL | Status: DC | PRN

## 2013-01-10 NOTE — Telephone Encounter (Signed)
rx was printed while pt was here.  Called pt back states she wanted it cALLED IN NEVER GOT PRINTED RX. CALLED PHARM CONFIRMED THEY DIDN'T HAVE THE PHENERGAN  Ok the phenergan 25mg  #6 no refills. Nothing further needed.

## 2013-01-10 NOTE — Progress Notes (Signed)
Patient ID: Megan Soto, female    DOB: 08/26/1960, 52 y.o.   MRN: [id number]  HPI 01/26/11- 52 year old female smoker followed for COPD complicated by anxiety, DM, GERD, musculoskeletal pain, hepatitis C. Last here 12/19, 2011- note reviewed.  Breathing has done fairly well with supplemental O2 2 L/M . Heat increases her cough and especially bad at night. Out of Combivent till tomorrow. Discussed overlap of these meds. Blames nerves for diffuse somatic pains with a lot of arthritis. Tussive headches. Scant sticky mucus. Uses neb 4x daily. Also on Spiriva- dry mouth.  Up frequently at night, implies diazepam doesn't keep her asleep at night.  Still smoking. Asks for pain meds/ cough syrup- says Dr Jeannetta Nap gave cough syrup 1 month ago. Still gets pain pills from Dr Thurmond Butts. We have provided her diazepam for sleep and anxiety, which helped to reduce her smoking.  10/07/11- 52 year old female smoker followed for COPD complicated by anxiety, DM, GERD, musculoskeletal pain, hepatitis C She denies acute problems. Reports that 3 weeks ago Dr. Jeannetta Nap did chest x-ray when she was coughing some blood in the sputum. Treated for infection. Sputum has cleared and she feels back to baseline. Continues oxygen 2 L/Advanced used for sleep and exertion. Using albuterol or Combivent rescue inhaler several times a day, nebulizer 2 or 3 times a day plus Spiriva. Reluctant to use Advair because of TV advertisements describing "infection". We discussed over- use of stimulant bronchodilators.  12/24/11- 52 year old female smoker followed for COPD complicated by anxiety, DM, GERD, musculoskeletal pain, hepatitis C Patient c/o sore throat, sob with exertion, wheeizng, and chest tightness.  Trying to keep her smoking down to 1 per hour. Cough rasps and keeps throat sore. occasional blood streak still, intermittent, not progressive. Denies nodes, chest pain. Biggest problem remains her chronic anxiety. She mentioned some  scattered somatic pains but I did not invite request for pain med. Medicaid did not want to do CT without more indication. CXR 11/27/11 IMPRESSION:  COPD. No active disease.  Original Report Authenticated By: Danae Orleans, M.D.   01/10/13- 52 year old female smoker followed for COPD complicated by anxiety, DM, GERD, musculoskeletal pain, hepatitis C O2 2 L/Advanced Depressed by family stress. Vomiting last night. Asks I give her an antidepressant. Taking Xanax to help sleep. Pain medicine clinic/ Dr Thurmond Butts. Nebulizer does help. Continues to smoke one pack per day. Complains of low back pain. CXR 02/22/12 IMPRESSION:  COPD. No active disease.  Original Report Authenticated By: Cyndie Chime, M.D.  Review of Systems-see HPI Constitutional:   No weight loss, night sweats,  Fevers, chills, fatigue, lassitude. HEENT:   + headaches,  Difficulty swallowing,  Tooth/dental problems,  Sore throat,                No sneezing, itching, ear ache, +nasal congestion, post nasal drip,  CV-No chest pain,  Orthopnea, PND, swelling in lower extremities, anasarca, dizziness, palpitations GI  + heartburn, indigestion,  No-abdominal pain, nausea, vomiting,  Resp: + shortness of breath with exertion or at rest.  No excess mucus, + productive cough,               +non-productive cough, No change in color of mucus.  + wheezing. No-hemoptysis Skin: no rash or lesions. GU: no dysuria,  MS:  No acute joint pain or swelling.   + sacral back pain. Psych:  No change in mood or affect. + depression or anxiety.  No memory loss.  Objective:   Physical Exam  General- Alert, Oriented, Affect-, depressed, Distress- none acute;  portable O2 2 L per minute/ 100%. Very thin, Has died her hair. Skin- rash-none, lesions- none, excoriation- none Lymphadenopathy- none Head- atraumatic            Eyes- Gross vision intact, PERRLA, conjunctivae clear secretions            Ears- Hearing, canals-normal            Nose- Clear,  no-Septal dev, mucus, polyps, erosion, perforation             Throat- Mallampati II , mucosa clear , drainage- none, tonsils- atrophic,  Neck- flexible , trachea midline, no stridor , thyroid nl, carotid no bruit Chest - symmetrical excursion , unlabored           Heart/CV- RRR , no murmur , no gallop  , no rub, nl s1 s2                           - JVD- none , edema- none, stasis changes- none, varices- none           Lung- clear to P&A, wheeze+,cough+  , dullness-none, rub- none.            Chest wall-  Abd-  Br/ Gen/ Rectal- Not done, not indicated Extrem- cyanosis- none, clubbing, none, atrophy- none, strength- nl,  +contracture of finger Neuro- grossly intact to observation \

## 2013-01-10 NOTE — Telephone Encounter (Signed)
Pt requesting rx for phenergan suppository  pt states having n/v. Call to [facility] [address].  Allergies  Allergen Reactions  . Aspirin Nausea And Vomiting   Dr young is this ok to fill . Thank you

## 2013-01-10 NOTE — Patient Instructions (Addendum)
Order  CXR Dx COPD  We can continue O2 2L/ Advanced  Scripts sent for Proair and for Combivent

## 2013-01-12 ENCOUNTER — Telehealth: Payer: Self-pay | Admitting: Internal Medicine

## 2013-01-12 MED ORDER — ONDANSETRON HCL 4 MG PO TABS: 4.0000 mg | ORAL_TABLET | Freq: Three times a day (TID) | ORAL | Status: DC | PRN

## 2013-01-12 NOTE — Telephone Encounter (Signed)
Spoke with the pt She states that she does not want to take phenergan supp, not helping with nausea and she needs a pill she can take by mouth Per Dr Maple Hudson, can call in zofran 4 mg # 6 1 every 8 hours prn no refills  Rx was sent to pharm and pt aware She will call her PCP if nausea not resolving

## 2013-01-18 NOTE — Progress Notes (Signed)
Quick Note:  Called spoke with patient, advised of cxr results as stated by CY. Pt verbalized her understanding and denied any questions. ______ 

## 2013-01-21 NOTE — Assessment & Plan Note (Signed)
Multiple somatic complaints strong impression they're related to depression. Pain management is through her pain clinic physician.

## 2013-01-21 NOTE — Assessment & Plan Note (Signed)
Smoking cessation emphasized. I agree to refill both pro air and Combivent after a long discussion. She does not use both at the same time

## 2013-01-21 NOTE — Assessment & Plan Note (Signed)
Counseling attempted. It's hard to imagine she will find the emotional resources to do this without outside behavioral health support

## 2013-01-21 NOTE — Assessment & Plan Note (Signed)
Chronic depression. Very limited social circumstances and obviously a hard life. Multiple somatic complaints. Recommend psychiatrist or psychologist. Her primary physician probably needs to make this referral

## 2013-02-07 ENCOUNTER — Telehealth: Payer: Self-pay | Admitting: Internal Medicine

## 2013-02-07 MED ORDER — ALBUTEROL SULFATE (2.5 MG/3ML) 0.083% IN NEBU: 2.5000 mg | INHALATION_SOLUTION | Freq: Four times a day (QID) | RESPIRATORY_TRACT | Status: DC | PRN

## 2013-02-07 NOTE — Telephone Encounter (Signed)
Pt needed RX for her albuterol neb sent. I have done so. Nothing further was needed

## 2013-02-08 ENCOUNTER — Ambulatory Visit: Payer: Medicaid Other | Admitting: Internal Medicine

## 2013-03-14 ENCOUNTER — Telehealth: Payer: Self-pay | Admitting: Internal Medicine

## 2013-03-14 DIAGNOSIS — J439 Emphysema, unspecified: Secondary | ICD-10-CM

## 2013-03-14 NOTE — Telephone Encounter (Signed)
I called [employer]. Was advised [employer] was currently having an office meeting. I left a message tcb x1 to check on this

## 2013-03-15 NOTE — Telephone Encounter (Signed)
Ok- order schedule on room air so we can see if she qualifies for portable O2  Dx COPD

## 2013-03-15 NOTE — Telephone Encounter (Signed)
Pt scheduled for 04/16/13 at 330 Pt requested waiting until September to do test for money purposes.   Will send to [facility] as FYI that test has been scheduled.

## 2013-03-15 NOTE — Telephone Encounter (Signed)
I called and spoke with [employer]. She stated pt is getting billed for night time O2 not portable. Pt does not have a portable tank. She has been using back up tank in case of emergency-pt must thought she this was portable for her . Per Robynn Pane pt will need testing for this if she needs portable O2. I dont see any testing for this in EPIC. Please advise Dr. Maple Hudson thanks  Last OV 01/10/13. Pending 05/2013

## 2013-04-05 ENCOUNTER — Telehealth: Payer: Self-pay | Admitting: Internal Medicine

## 2013-04-05 NOTE — Telephone Encounter (Signed)
Megan Budge, MD at 03/15/2013 1:00 PM   Status: Signed            Ok- order schedule on room air so we can see if she qualifies for portable O2    --  I called and made pt aware she has not qualified for daytime O2 yet. She is aware she needs to 6mw test done first. She voiced her understanding and needed nothing further

## 2013-04-16 ENCOUNTER — Ambulatory Visit: Payer: Medicaid Other

## 2013-04-25 ENCOUNTER — Ambulatory Visit (INDEPENDENT_AMBULATORY_CARE_PROVIDER_SITE_OTHER): Payer: Medicaid Other

## 2013-04-25 ENCOUNTER — Ambulatory Visit (INDEPENDENT_AMBULATORY_CARE_PROVIDER_SITE_OTHER): Payer: Medicaid Other | Admitting: Internal Medicine

## 2013-04-25 DIAGNOSIS — J438 Other emphysema: Secondary | ICD-10-CM

## 2013-04-25 DIAGNOSIS — Z23 Encounter for immunization: Secondary | ICD-10-CM

## 2013-04-25 DIAGNOSIS — J439 Emphysema, unspecified: Secondary | ICD-10-CM

## 2013-04-25 DIAGNOSIS — R0602 Shortness of breath: Secondary | ICD-10-CM

## 2013-04-25 NOTE — Progress Notes (Signed)
Documentation for 6 MWT 

## 2013-05-07 ENCOUNTER — Telehealth: Payer: Self-pay | Admitting: Internal Medicine

## 2013-05-07 NOTE — Telephone Encounter (Signed)
Her oxygen level stayed good on the 6 MWT- she doesn't meet the guidelines for portable O2.  Can we check and see who and when she got her Xanax from please. Ok to refill once if we did it.

## 2013-05-07 NOTE — Telephone Encounter (Signed)
Patients last Xanax not filled by our office-- Informed patient to try and contact her PCP for refill Advised of and not qualifying for o2, patient verbalized understanding and nothing further needed

## 2013-05-07 NOTE — Telephone Encounter (Signed)
Spoke with patient-- Patient had done 9/17 Patient would like to know if she qualifies for portable o2 and if so if this can be ordered? Patient also requesting Rx for xanax be called in to [facility] [address]-- States she just buried her sister yesterday and is having a very difficult time coping with this (patient very tearful on the phone) Dr. Maple Hudson please advise, thank you!  Last OV 01/10/13 Next OV 05/16/13

## 2013-05-10 ENCOUNTER — Other Ambulatory Visit: Payer: Self-pay | Admitting: Internal Medicine

## 2013-05-10 MED ORDER — TIOTROPIUM BROMIDE MONOHYDRATE 18 MCG IN CAPS: 18.0000 ug | ORAL_CAPSULE | Freq: Every day | RESPIRATORY_TRACT | Status: DC

## 2013-05-16 ENCOUNTER — Ambulatory Visit: Payer: Medicaid Other | Admitting: Internal Medicine

## 2013-06-04 ENCOUNTER — Telehealth: Payer: Self-pay | Admitting: Internal Medicine

## 2013-06-04 DIAGNOSIS — J449 Chronic obstructive pulmonary disease, unspecified: Secondary | ICD-10-CM

## 2013-06-04 NOTE — Telephone Encounter (Signed)
Ok to order ONOX on room air for dx COPD

## 2013-06-04 NOTE — Telephone Encounter (Signed)
Order has been placed. Kem Kays is aware.

## 2013-06-04 NOTE — Telephone Encounter (Signed)
Dr. Maple Hudson, please advise if okay to order ONO for recert for pt O2 at night thanks

## 2013-06-22 ENCOUNTER — Ambulatory Visit: Payer: Medicaid Other | Admitting: Internal Medicine

## 2013-07-10 ENCOUNTER — Ambulatory Visit: Payer: Medicaid Other | Admitting: Internal Medicine

## 2013-07-17 ENCOUNTER — Encounter: Payer: Self-pay | Admitting: Internal Medicine

## 2013-07-17 ENCOUNTER — Ambulatory Visit (INDEPENDENT_AMBULATORY_CARE_PROVIDER_SITE_OTHER): Payer: Medicaid Other | Admitting: Internal Medicine

## 2013-07-17 VITALS — BP 122/80 | HR 95 | Ht 61.5 in | Wt 113.6 lb

## 2013-07-17 DIAGNOSIS — F17200 Nicotine dependence, unspecified, uncomplicated: Secondary | ICD-10-CM

## 2013-07-17 DIAGNOSIS — R0600 Dyspnea, unspecified: Secondary | ICD-10-CM

## 2013-07-17 DIAGNOSIS — J438 Other emphysema: Secondary | ICD-10-CM

## 2013-07-17 DIAGNOSIS — R0609 Other forms of dyspnea: Secondary | ICD-10-CM

## 2013-07-17 DIAGNOSIS — J439 Emphysema, unspecified: Secondary | ICD-10-CM

## 2013-07-17 MED ORDER — OMEPRAZOLE 20 MG PO CPDR: 20.0000 mg | DELAYED_RELEASE_CAPSULE | Freq: Every day | ORAL | Status: DC

## 2013-07-17 NOTE — Progress Notes (Signed)
Patient ID: Megan Soto, female    DOB: 04-10-1961, 52 y.o.   MRN: [id number]  HPI 01/26/11- 52 year old female smoker followed for COPD complicated by anxiety, DM, GERD, musculoskeletal pain, hepatitis C. Last here 12/19, 2011- note reviewed.  Breathing has done fairly well with supplemental O2 2 L/M . Heat increases her cough and especially bad at night. Out of Combivent till tomorrow. Discussed overlap of these meds. Blames nerves for diffuse somatic pains with a lot of arthritis. Tussive headches. Scant sticky mucus. Uses neb 4x daily. Also on Spiriva- dry mouth.  Up frequently at night, implies diazepam doesn't keep her asleep at night.  Still smoking. Asks for pain meds/ cough syrup- says Dr Jeannetta Nap gave cough syrup 1 month ago. Still gets pain pills from Dr Thurmond Butts. We have provided her diazepam for sleep and anxiety, which helped to reduce her smoking.  10/07/11- 52 year old female smoker followed for COPD complicated by anxiety, DM, GERD, musculoskeletal pain, hepatitis C She denies acute problems. Reports that 3 weeks ago Dr. Jeannetta Nap did chest x-ray when she was coughing some blood in the sputum. Treated for infection. Sputum has cleared and she feels back to baseline. Continues oxygen 2 L/Advanced used for sleep and exertion. Using albuterol or Combivent rescue inhaler several times a day, nebulizer 2 or 3 times a day plus Spiriva. Reluctant to use Advair because of TV advertisements describing "infection". We discussed over- use of stimulant bronchodilators.  12/24/11- 52 year old female smoker followed for COPD complicated by anxiety, DM, GERD, musculoskeletal pain, hepatitis C Patient c/o sore throat, sob with exertion, wheeizng, and chest tightness.  Trying to keep her smoking down to 1 per hour. Cough rasps and keeps throat sore. occasional blood streak still, intermittent, not progressive. Denies nodes, chest pain. Biggest problem remains her chronic anxiety. She mentioned some  scattered somatic pains but I did not invite request for pain med. Medicaid did not want to do CT without more indication. CXR 11/27/11 IMPRESSION:  COPD. No active disease.  Original Report Authenticated By: Danae Orleans, M.D.   01/10/13- 52 year old female smoker followed for COPD complicated by anxiety, DM, GERD, musculoskeletal pain, hepatitis C O2 2 L/Advanced Depressed by family stress. Vomiting last night. Asks I give her an antidepressant. Taking Xanax to help sleep. Pain medicine clinic/ Dr Thurmond Butts. Nebulizer does help. Continues to smoke one pack per day. Complains of low back pain. CXR 02/22/12 IMPRESSION:  COPD. No active disease.  Original Report Authenticated By: Cyndie Chime, M.D.  07/17/13-52 year old female smoker followed for COPD complicated by anxiety, DM, GERD, musculoskeletal pain, hepatitis C O2 2 L/Advanced FOLLOWS FOR: breathing is "off and on"; depends on what she is doing. Is having more cramps in lower chest area. Using O2 at times when needed. would like Rx for Prilosec sent to pharmacy. His with wood stove which bothers her breathing. Dyspnea on exertion walking the store but does not have transportation. Discussed flu and pneumonia vaccinations. CXR 01/10/13 IMPRESSION:  1. No acute cardiopulmonary abnormalities.  Original Report Authenticated By: Signa Kell, M.D  6 MWT 04/25/13-- 95%, 96%, 96% 384 meters- quit w/ 20 seconds left, saying legs like jello, unable to walk farther. No oxygen limitation  Review of Systems-see HPI Constitutional:   No weight loss, night sweats,  Fevers, chills, fatigue, lassitude. HEENT:   + headaches,  Difficulty swallowing,  Tooth/dental problems,  Sore throat,                No sneezing, itching,  ear ache, +nasal congestion, post nasal drip,  CV-No chest pain,  Orthopnea, PND, swelling in lower extremities, anasarca, dizziness, palpitations GI  + heartburn, indigestion,  No-abdominal pain, nausea, vomiting,  Resp: + shortness of  breath with exertion or at rest.  No excess mucus, + productive cough,               +non-productive cough, No change in color of mucus.  + wheezing. No-hemoptysis Skin: no rash or lesions. GU: no dysuria,  MS:  No acute joint pain or swelling.   + sacral back pain. Psych:  No change in mood or affect. + depression or anxiety.  No memory loss.  Objective:   Physical Exam General- Alert, Oriented, Affect-, depressed, Distress- none acute;  portable O2 2 L per minute/ 100%. Very thin, Has died her hair. Skin- rash-none, lesions- none, excoriation- none Lymphadenopathy- none Head- atraumatic            Eyes- Gross vision intact, PERRLA, conjunctivae clear secretions            Ears- Hearing, canals-normal            Nose- Clear, no-Septal dev, mucus, polyps, erosion, perforation             Throat- Mallampati II , mucosa clear , drainage- none, tonsils- atrophic,  Neck- flexible , trachea midline, no stridor , thyroid nl, carotid no bruit Chest - symmetrical excursion , unlabored           Heart/CV- RRR , no murmur , no gallop  , no rub, nl s1 s2                           - JVD- none , edema- none, stasis changes- none, varices- none           Lung- +distant butclear to P&A, wheeze+,cough+dry  , dullness-none, rub- none.            Chest wall-  Abd-  Br/ Gen/ Rectal- Not done, not indicated Extrem- cyanosis- none, clubbing, none, atrophy- none, strength- nl,  +contracture of finger Neuro- grossly intact to observation \

## 2013-07-17 NOTE — Patient Instructions (Signed)
Script sent refilling Prilosec  Please keep trying to skip a cigarette every time you can !

## 2013-07-31 ENCOUNTER — Telehealth: Payer: Self-pay | Admitting: Internal Medicine

## 2013-07-31 NOTE — Telephone Encounter (Signed)
atc na x1 

## 2013-08-01 NOTE — Telephone Encounter (Signed)
Spoke with the pt  She states that her msg was NOT pertaining to hydrocodone  She is asking "what am I supposed to do about my hepatitis" CD[facility] did not dx the pt with Hep  I advised her to call Dr Jeannetta Nap  Pt verbalized understanding and nothing further needed

## 2013-08-08 NOTE — Assessment & Plan Note (Signed)
She has home oxygen and wishes to keep it but did not portable oxygen scoring criteria on walk test in September 2014. Has home oxygen for sleep. Smoking cessation emphasized.

## 2013-08-08 NOTE — Assessment & Plan Note (Addendum)
6 MWT 04/25/13-- 95%, 96%, 96% 384 meters- quit w/ 20 seconds left, saying legs like jello, unable to walk farther. No oxygen limitation Dyspnea on exertion may reflect deconditioning and peripheral vascular disease. Conjunctivae did not suggest anemia. Plan-more regular exercise, stop smoking

## 2013-08-08 NOTE — Assessment & Plan Note (Signed)
Counseling done 

## 2013-08-13 ENCOUNTER — Telehealth: Payer: Self-pay | Admitting: Internal Medicine

## 2013-08-13 NOTE — Telephone Encounter (Signed)
Attempted to call pt but no machine and no answer.  Will try back later 

## 2013-08-14 NOTE — Telephone Encounter (Signed)
I spoke with pt. She c/o scratchy throat, dry throat, prod cough w/ white phlem-worse at night x 1 week. She is requesting hydrocodone cough syrup but has no way to get her to get this prescription. She reports she will just get OTC cough syrup. Will sign off message

## 2013-09-06 ENCOUNTER — Other Ambulatory Visit: Payer: Self-pay | Admitting: Internal Medicine

## 2013-09-19 ENCOUNTER — Ambulatory Visit: Payer: Medicaid Other

## 2013-10-10 ENCOUNTER — Ambulatory Visit: Payer: Medicaid Other

## 2013-11-12 ENCOUNTER — Telehealth: Payer: Self-pay | Admitting: Internal Medicine

## 2013-11-12 NOTE — Telephone Encounter (Signed)
Called and spoke with pt and she stated that she felt bad from the conversation earlier and wanted to call back and apologize.  i advised her that she was ok and did not need to.  Nothing further is needed.

## 2013-11-12 NOTE — Telephone Encounter (Signed)
I can't fill these. She can ask her PCP Dr Jeannetta NapElkins.

## 2013-11-12 NOTE — Telephone Encounter (Signed)
Called and spoke with pt and she is aware that [facility] will not be able to fill these medications for her.

## 2013-11-12 NOTE — Telephone Encounter (Signed)
Called, spoke with pt.  Informed her of below per Dr. Maple HudsonYoung. Pt states she has already asked Dr. Jeannetta NapElkins and "he will not do it for me for some reason either."  Pt then states "I will see what I can do to get them off the street."  I advised pt against this d/t the legal consequences and street drugs not being reliable/managed.  Pt stated, "whatever happens, happens.  I can't keep going like this."  Again, directed pt to Dr. Thurmond ButtsKpeglo or Dr. Jeannetta NapElkins' office for further recs in the meantime of trying to get meds refilled.  Pt said "ok" and hung up.    I atc Dr. Warden FillersKpeglo's office at [phone].  Line rang > 10 times each call with no answer, no answering service, and no option to leave msg or speak with someone.

## 2013-11-12 NOTE — Telephone Encounter (Signed)
Spoke with the pt  She states that she is is need of RF on alprazolam 1 mg 1 tid and Norco 10/325 1 qid  She states that Dr. Thurmond ButtsKpeglo "at the walk in" normally refills these, but she has not been able to reach him for the past wk  She states that she is out of meds and in desperate need She states "I can't afford to buy them off the streets" I called the number she gave me for Dr Thurmond ButtsKpeglo, and the line just rings and rings  I called [facility] and spoke with Homero FellersFrank- last filled both meds on 10/12/13- alprazolam # 60 and norco #120  Please advise if you want to refill these meds for her thanks! Allergies  Allergen Reactions  . Aspirin Nausea And Vomiting   Current Outpatient Prescriptions on File Prior to Visit  Medication Sig Dispense Refill  . albuterol (PROAIR HFA) 108 (90 BASE) MCG/ACT inhaler Inhale 2 puffs into the lungs every 4 (four) hours as needed for wheezing or shortness of breath.  2 Inhaler  prn  . albuterol (PROVENTIL) (2.5 MG/3ML) 0.083% nebulizer solution Take 3 mLs (2.5 mg total) by nebulization every 6 (six) hours as needed. DX 492.8  360 mL  3  . alprazolam (XANAX) 2 MG tablet Take 2 mg by mouth at bedtime as needed. For anxiety      . fish oil-omega-3 fatty acids 1000 MG capsule Take 1 g by mouth daily.      Marland Kitchen. HYDROcodone-acetaminophen (NORCO) 10-325 MG per tablet Take 1 tablet by mouth every 6 (six) hours as needed. pain      . Ipratropium-Albuterol (COMBIVENT) 20-100 MCG/ACT AERS respimat Inhale 1 puff into the lungs every 6 (six) hours as needed for wheezing or shortness of breath. For shortness of breath due to panic attacks  4 g  prn  . lisinopril-hydrochlorothiazide (PRINZIDE,ZESTORETIC) 20-12.5 MG per tablet Take 1 tablet by mouth daily.      . metFORMIN (GLUCOPHAGE) 500 MG tablet Take 500 mg by mouth 2 (two) times daily with a meal.       . Multiple Vitamin (MULTIVITAMIN WITH MINERALS) TABS Take 1 tablet by mouth daily.      Marland Kitchen. omeprazole (PRILOSEC) 20 MG capsule Take 1  capsule (20 mg total) by mouth daily.  30 capsule  prn  . SPIRIVA HANDIHALER 18 MCG inhalation capsule PLACE 1 CAPSULE INTO INHALER AND INHALE ONCE DAILY AS DIRECTED  30 capsule  3   No current facility-administered medications on file prior to visit.

## 2013-12-20 ENCOUNTER — Encounter (HOSPITAL_COMMUNITY): Payer: Self-pay | Admitting: Emergency Medicine

## 2013-12-20 ENCOUNTER — Emergency Department (HOSPITAL_COMMUNITY)
Admission: EM | Admit: 2013-12-20 | Discharge: 2013-12-20 | Disposition: A | Discharge status: Home / Self Care | Payer: Medicaid Other | Attending: Emergency Medicine | Admitting: Emergency Medicine

## 2013-12-20 ENCOUNTER — Emergency Department (HOSPITAL_COMMUNITY): Payer: Medicaid Other

## 2013-12-20 DIAGNOSIS — F3289 Other specified depressive episodes: Secondary | ICD-10-CM | POA: Insufficient documentation

## 2013-12-20 DIAGNOSIS — F329 Major depressive disorder, single episode, unspecified: Secondary | ICD-10-CM | POA: Insufficient documentation

## 2013-12-20 DIAGNOSIS — Z79899 Other long term (current) drug therapy: Secondary | ICD-10-CM | POA: Insufficient documentation

## 2013-12-20 DIAGNOSIS — K219 Gastro-esophageal reflux disease without esophagitis: Secondary | ICD-10-CM | POA: Insufficient documentation

## 2013-12-20 DIAGNOSIS — E119 Type 2 diabetes mellitus without complications: Secondary | ICD-10-CM | POA: Insufficient documentation

## 2013-12-20 DIAGNOSIS — J449 Chronic obstructive pulmonary disease, unspecified: Secondary | ICD-10-CM

## 2013-12-20 DIAGNOSIS — I10 Essential (primary) hypertension: Secondary | ICD-10-CM | POA: Insufficient documentation

## 2013-12-20 DIAGNOSIS — F419 Anxiety disorder, unspecified: Secondary | ICD-10-CM

## 2013-12-20 DIAGNOSIS — F17200 Nicotine dependence, unspecified, uncomplicated: Secondary | ICD-10-CM | POA: Insufficient documentation

## 2013-12-20 DIAGNOSIS — M19049 Primary osteoarthritis, unspecified hand: Secondary | ICD-10-CM | POA: Insufficient documentation

## 2013-12-20 DIAGNOSIS — M545 Low back pain, unspecified: Secondary | ICD-10-CM | POA: Insufficient documentation

## 2013-12-20 DIAGNOSIS — J441 Chronic obstructive pulmonary disease with (acute) exacerbation: Secondary | ICD-10-CM | POA: Insufficient documentation

## 2013-12-20 DIAGNOSIS — F411 Generalized anxiety disorder: Secondary | ICD-10-CM | POA: Insufficient documentation

## 2013-12-20 DIAGNOSIS — M1710 Unilateral primary osteoarthritis, unspecified knee: Secondary | ICD-10-CM | POA: Insufficient documentation

## 2013-12-20 DIAGNOSIS — J45901 Unspecified asthma with (acute) exacerbation: Secondary | ICD-10-CM

## 2013-12-20 DIAGNOSIS — IMO0002 Reserved for concepts with insufficient information to code with codable children: Secondary | ICD-10-CM

## 2013-12-20 DIAGNOSIS — Z8619 Personal history of other infectious and parasitic diseases: Secondary | ICD-10-CM | POA: Insufficient documentation

## 2013-12-20 DIAGNOSIS — M19019 Primary osteoarthritis, unspecified shoulder: Secondary | ICD-10-CM | POA: Insufficient documentation

## 2013-12-20 DIAGNOSIS — R109 Unspecified abdominal pain: Secondary | ICD-10-CM | POA: Insufficient documentation

## 2013-12-20 DIAGNOSIS — IMO0001 Reserved for inherently not codable concepts without codable children: Secondary | ICD-10-CM | POA: Insufficient documentation

## 2013-12-20 LAB — BASIC METABOLIC PANEL
BUN: 7 mg/dL (ref 6–23)
CO2: 31 mEq/L (ref 19–32)
Calcium: 9.1 mg/dL (ref 8.4–10.5)
Chloride: 99 mEq/L (ref 96–112)
Creatinine, Ser: 0.7 mg/dL (ref 0.50–1.10)
GFR calc Af Amer: >90 mL/min (ref >90)
GFR calc non Af Amer: >90 mL/min (ref >90)
Glucose, Bld: 126 mg/dL — ABNORMAL HIGH (ref 70–99)
Potassium: 4.3 mEq/L (ref 3.7–5.3)
Sodium: 139 mEq/L (ref 137–147)

## 2013-12-20 LAB — CBC
HCT: 39.3 % (ref 36.0–46.0)
Hemoglobin: 12.8 g/dL (ref 12.0–15.0)
MCH: 31.1 pg (ref 26.0–34.0)
MCHC: 32.6 g/dL (ref 30.0–36.0)
MCV: 95.6 fL (ref 78.0–100.0)
Platelets: 150 10*3/uL (ref 150–400)
RBC: 4.11 MIL/uL (ref 3.87–5.11)
RDW: 13.4 % (ref 11.5–15.5)
WBC: 6.8 10*3/uL (ref 4.0–10.5)

## 2013-12-20 MED ORDER — IPRATROPIUM BROMIDE 0.02 % IN SOLN
0.5000 mg | Freq: Once | RESPIRATORY_TRACT | Status: AC
  Administered 2013-12-20: 0.5 mg via RESPIRATORY_TRACT
  Filled 2013-12-20: qty 2.5

## 2013-12-20 MED ORDER — ALBUTEROL SULFATE (2.5 MG/3ML) 0.083% IN NEBU
5.0000 mg | INHALATION_SOLUTION | Freq: Once | RESPIRATORY_TRACT | Status: AC
  Administered 2013-12-20: 5 mg via RESPIRATORY_TRACT
  Filled 2013-12-20: qty 6

## 2013-12-20 MED ORDER — ALPRAZOLAM 0.5 MG PO TABS
1.0000 mg | ORAL_TABLET | Freq: Once | ORAL | Status: AC
  Administered 2013-12-20: 1 mg via ORAL
  Filled 2013-12-20: qty 2

## 2013-12-20 MED ORDER — HYDROCODONE-ACETAMINOPHEN 5-325 MG PO TABS: 1.0000 | ORAL_TABLET | Freq: Four times a day (QID) | ORAL | Status: DC | PRN

## 2013-12-20 MED ORDER — OXYCODONE-ACETAMINOPHEN 5-325 MG PO TABS
1.0000 | ORAL_TABLET | Freq: Once | ORAL | Status: AC
  Administered 2013-12-20: 1 via ORAL
  Filled 2013-12-20: qty 1

## 2013-12-20 MED ORDER — ALBUTEROL SULFATE HFA 108 (90 BASE) MCG/ACT IN AERS
2.0000 | INHALATION_SPRAY | RESPIRATORY_TRACT | Status: DC | PRN
Start: 2013-12-20 — End: 2014-02-06

## 2013-12-20 MED ORDER — HYDROCODONE-ACETAMINOPHEN 5-325 MG PO TABS
2.0000 | ORAL_TABLET | Freq: Once | ORAL | Status: AC
  Administered 2013-12-20: 2 via ORAL
  Filled 2013-12-20: qty 2

## 2013-12-20 NOTE — ED Notes (Signed)
MD at bedside. 

## 2013-12-20 NOTE — ED Notes (Addendum)
Pt denies CP

## 2013-12-20 NOTE — ED Notes (Signed)
Pt c/o lower back pain that radiates around into abd. Pt sts when she coughs she will get pain in her chest. 12 lead unremarkable. Hx of COPD, is oxygen dependent and lower disc problems. BP 140/100 HR 110. 100% on 2 liters.

## 2013-12-20 NOTE — Discharge Instructions (Signed)
For back pain, rest, avoid bending at waist or heavy lifting for the next few days. Take motrin or aleve as need for pain. You may also take hydrocodone as need for pain. No driving for the next 6 hours or when taking hydrocodone. Also, do not take tylenol or acetaminophen containing medication when taking hydrocodone. For anxiety, take your xanax as need - no driving when taking. For wheezing/copd, no smoking.  Use your inhaler treatments as prescribed, as need.  Follow up with primary care doctor in the next couple days for recheck - call office tomorrow morning to arrange appointment. Return to ER if worse, new symptoms, fevers, worsening breathing, other concern.  You were given pain medication in the ER - no driving for the next 6 hours.    Chronic Obstructive Pulmonary Disease Chronic obstructive pulmonary disease (COPD) is a common lung condition in which airflow from the lungs is limited. COPD is a general term that can be used to describe many different lung problems that limit airflow, including both chronic bronchitis and emphysema. If you have COPD, your lung function will probably never return to normal, but there are measures you can take to improve lung function and make yourself feel better.  CAUSES   Smoking (common).   Exposure to secondhand smoke.   Genetic problems.  Chronic inflammatory lung diseases or recurrent infections. SYMPTOMS   Shortness of breath, especially with physical activity.   Deep, persistent (chronic) cough with a large amount of thick mucus.   Wheezing.   Rapid breaths (tachypnea).   Gray or bluish discoloration (cyanosis) of the skin, especially in fingers, toes, or lips.   Fatigue.   Weight loss.   Frequent infections or episodes when breathing symptoms become much worse (exacerbations).   Chest tightness. DIAGNOSIS  Your healthcare provider will take a medical history and perform a physical examination to make the initial  diagnosis. Additional tests for COPD may include:   Lung (pulmonary) function tests.  Chest X-ray.  CT scan.  Blood tests. TREATMENT  Treatment available to help you feel better when you have COPD include:   Inhaler and nebulizer medicines. These help manage the symptoms of COPD and make your breathing more comfortable  Supplemental oxygen. Supplemental oxygen is only helpful if you have a low oxygen level in your blood.   Exercise and physical activity. These are beneficial for nearly all people with COPD. Some people may also benefit from a pulmonary rehabilitation program. HOME CARE INSTRUCTIONS   Take all medicines (inhaled or pills) as directed by your health care provider.  Only take over-the-counter or prescription medicines for pain, fever, or discomfort as directed by your health care provider.   Avoid over-the-counter medicines or cough syrups that dry up your airway (such as antihistamines) and slow down the elimination of secretions unless instructed otherwise by your healthcare provider.   If you are a smoker, the most important thing that you can do is stop smoking. Continuing to smoke will cause further lung damage and breathing trouble. Ask your health care provider for help with quitting smoking. He or she can direct you to community resources or hospitals that provide support.  Avoid exposure to irritants such as smoke, chemicals, and fumes that aggravate your breathing.  Use oxygen therapy and pulmonary rehabilitation if directed by your health care provider. If you require home oxygen therapy, ask your healthcare provider whether you should purchase a pulse oximeter to measure your oxygen level at home.   Avoid contact  with individuals who have a contagious illness.  Avoid extreme temperature and humidity changes.  Eat healthy foods. Eating smaller, more frequent meals and resting before meals may help you maintain your strength.  Stay active, but balance  activity with periods of rest. Exercise and physical activity will help you maintain your ability to do things you want to do.  Preventing infection and hospitalization is very important when you have COPD. Make sure to receive all the vaccines your health care provider recommends, especially the pneumococcal and influenza vaccines. Ask your healthcare provider whether you need a pneumonia vaccine.  Learn and use relaxation techniques to manage stress.  Learn and use controlled breathing techniques as directed by your health care provider. Controlled breathing techniques include:   Pursed lip breathing. Start by breathing in (inhaling) through your nose for 1 second. Then, purse your lips as if you were going to whistle and breathe out (exhale) through the pursed lips for 2 seconds.   Diaphragmatic breathing. Start by putting one hand on your abdomen just above your waist. Inhale slowly through your nose. The hand on your abdomen should move out. Then purse your lips and exhale slowly. You should be able to feel the hand on your abdomen moving in as you exhale.   Learn and use controlled coughing to clear mucus from your lungs. Controlled coughing is a series of short, progressive coughs. The steps of controlled coughing are:  1. Lean your head slightly forward.  2. Breathe in deeply using diaphragmatic breathing.  3. Try to hold your breath for 3 seconds.  4. Keep your mouth slightly open while coughing twice.  5. Spit any mucus out into a tissue.  6. Rest and repeat the steps once or twice as needed. SEEK MEDICAL CARE IF:   You are coughing up more mucus than usual.   There is a change in the color or thickness of your mucus.   Your breathing is more labored than usual.   Your breathing is faster than usual.  SEEK IMMEDIATE MEDICAL CARE IF:   You have shortness of breath while you are resting.   You have shortness of breath that prevents you from:  Being able to  talk.   Performing your usual physical activities.   You have chest pain lasting longer than 5 minutes.   Your skin color is more cyanotic than usual.  You measure low oxygen saturations for longer than 5 minutes with a pulse oximeter. MAKE SURE YOU:   Understand these instructions.  Will watch your condition.  Will get help right away if you are not doing well or get worse. Document Released: 05/05/2005 Document Revised: 05/16/2013 Document Reviewed: 03/22/2013 [facility] Patient Information 2014 [place], [place].    Back Pain, Adult Low back pain is very common. About 1 in 5 people have back pain.The cause of low back pain is rarely dangerous. The pain often gets better over time.About half of people with a sudden onset of back pain feel better in just 2 weeks. About 8 in 10 people feel better by 6 weeks.  CAUSES Some common causes of back pain include:  Strain of the muscles or ligaments supporting the spine.  Wear and tear (degeneration) of the spinal discs.  Arthritis.  Direct injury to the back. DIAGNOSIS Most of the time, the direct cause of low back pain is not known.However, back pain can be treated effectively even when the exact cause of the pain is unknown.Answering your caregiver's questions about your  overall health and symptoms is one of the most accurate ways to make sure the cause of your pain is not dangerous. If your caregiver needs more information, he or she may order lab work or imaging tests (X-rays or MRIs).However, even if imaging tests show changes in your back, this usually does not require surgery. HOME CARE INSTRUCTIONS For many people, back pain returns.Since low back pain is rarely dangerous, it is often a condition that people can learn to [facility] their own.   Remain active. It is stressful on the back to sit or stand in one place. Do not sit, drive, or stand in one place for more than 30 minutes at a time. Take short walks on level  surfaces as soon as pain allows.Try to increase the length of time you walk each day.  Do not stay in bed.Resting more than 1 or 2 days can delay your recovery.  Do not avoid exercise or work.Your body is made to move.It is not dangerous to be active, even though your back may hurt.Your back will likely heal faster if you return to being active before your pain is gone.  Pay attention to your body when you bend and lift. Many people have less discomfortwhen lifting if they bend their knees, keep the load close to their bodies,and avoid twisting. Often, the most comfortable positions are those that put less stress on your recovering back.  Find a comfortable position to sleep. Use a firm mattress and lie on your side with your knees slightly bent. If you lie on your back, put a pillow under your knees.  Only take over-the-counter or prescription medicines as directed by your caregiver. Over-the-counter medicines to reduce pain and inflammation are often the most helpful.Your caregiver may prescribe muscle relaxant drugs.These medicines help dull your pain so you can more quickly return to your normal activities and healthy exercise.  Put ice on the injured area.  Put ice in a plastic bag.  Place a towel between your skin and the bag.  Leave the ice on for 15-20 minutes, 03-04 times a day for the first 2 to 3 days. After that, ice and heat may be alternated to reduce pain and spasms.  Ask your caregiver about trying back exercises and gentle massage. This may be of some benefit.  Avoid feeling anxious or stressed.Stress increases muscle tension and can worsen back pain.It is important to recognize when you are anxious or stressed and learn ways to manage it.Exercise is a great option. SEEK MEDICAL CARE IF:  You have pain that is not relieved with rest or medicine.  You have pain that does not improve in 1 week.  You have new symptoms.  You are generally not feeling  well. SEEK IMMEDIATE MEDICAL CARE IF:   You have pain that radiates from your back into your legs.  You develop new bowel or bladder control problems.  You have unusual weakness or numbness in your arms or legs.  You develop nausea or vomiting.  You develop abdominal pain.  You feel faint. Document Released: 07/26/2005 Document Revised: 01/25/2012 Document Reviewed: 12/14/2010 [facility] Patient Information 2014 [place], [place].   Smoking Hazards Smoking cigarettes is extremely bad for your health. Tobacco smoke has over 200 known poisons in it. It contains the poisonous gases nitrogen oxide and carbon monoxide. There are over 60 chemicals in tobacco smoke that cause cancer. Some of the chemicals found in cigarette smoke include:   Cyanide.   Benzene.   Formaldehyde.  Methanol (wood alcohol).   Acetylene (fuel used in welding torches).   Ammonia.  Even smoking lightly shortens your life expectancy by several years. You can greatly reduce the risk of medical problems for you and your family by stopping now. Smoking is the most preventable cause of death and disease in our society. Within days of quitting smoking, your circulation improves, you decrease the risk of having a heart attack, and your lung capacity improves. There may be some increased phlegm in the first few days after quitting, and it may take months for your lungs to clear up completely. Quitting for 10 years reduces your risk of developing lung cancer to almost that of a nonsmoker.  WHAT ARE THE RISKS OF SMOKING? Cigarette smokers have an increased risk of many serious medical problems, including:  Lung cancer.   Lung disease (such as pneumonia, bronchitis, and emphysema).   Heart attack and chest pain due to the heart not getting enough oxygen (angina).   Heart disease and peripheral blood vessel disease.   Hypertension.   Stroke.   Oral cancer (cancer of the lip, mouth, or voice box).    Bladder cancer.   Pancreatic cancer.   Cervical cancer.   Pregnancy complications, including premature birth.   Stillbirths and smaller newborn babies, birth defects, and genetic damage to sperm.   Early menopause.   Lower estrogen level for women.   Infertility.   Facial wrinkles.   Blindness.   Increased risk of broken bones (fractures).   Senile dementia.   Stomach ulcers and internal bleeding.   Delayed wound healing and increased risk of complications during surgery. Because of secondhand smoke exposure, children of smokers have an increased risk of the following:   Sudden infant death syndrome (SIDS).   Respiratory infections.   Lung cancer.   Heart disease.   Ear infections.  WHY IS SMOKING ADDICTIVE? Nicotine is the chemical agent in tobacco that is capable of causing addiction or dependence. When you smoke and inhale, nicotine is absorbed rapidly into the bloodstream through your lungs. Both inhaled and noninhaled nicotine may be addictive.  WHAT ARE THE BENEFITS OF QUITTING?  There are many health benefits to quitting smoking. Some are:   The likelihood of developing cancer and heart disease decreases. Health improvements are seen almost immediately.   Blood pressure, pulse rate, and breathing patterns start returning to normal soon after quitting.   People who quit may see an improvement in their overall quality of life.  HOW DO YOU QUIT SMOKING? Smoking is an addiction with both physical and psychological effects, and longtime habits can be hard to change. Your health care provider can recommend:  Programs and community resources, which may include group support, education, or therapy.  Replacement products, such as patches, gum, and nasal sprays. Use these products only as directed. Do not replace cigarette smoking with electronic cigarettes (commonly called e-cigarettes). The safety of e-cigarettes is unknown, and some may  contain harmful chemicals. FOR MORE INFORMATION  American Lung Association: www.lung.org  American Cancer Society: www.cancer.org Document Released: 09/02/2004 Document Revised: 05/16/2013 Document Reviewed: 01/15/2013 [facility] Patient Information 2014 [place], [place].   Cough, Adult  A cough is a reflex that helps clear your throat and airways. It can help heal the body or may be a reaction to an irritated airway. A cough may only last 2 or 3 weeks (acute) or may last more than 8 weeks (chronic).  CAUSES Acute cough:  Viral or bacterial infections. Chronic cough:  Infections.  Allergies.  Asthma.  Post-nasal drip.  Smoking.  Heartburn or acid reflux.  Some medicines.  Chronic lung problems (COPD).  Cancer. SYMPTOMS   Cough.  Fever.  Chest pain.  Increased breathing rate.  High-pitched whistling sound when breathing (wheezing).  Colored mucus that you cough up (sputum). TREATMENT   A bacterial cough may be treated with antibiotic medicine.  A viral cough must run its course and will not respond to antibiotics.  Your caregiver may recommend other treatments if you have a chronic cough. HOME CARE INSTRUCTIONS   Only take over-the-counter or prescription medicines for pain, discomfort, or fever as directed by your caregiver. Use cough suppressants only as directed by your caregiver.  Use a cold steam vaporizer or humidifier in your bedroom or home to help loosen secretions.  Sleep in a semi-upright position if your cough is worse at night.  Rest as needed.  Stop smoking if you smoke. SEEK IMMEDIATE MEDICAL CARE IF:   You have pus in your sputum.  Your cough starts to worsen.  You cannot control your cough with suppressants and are losing sleep.  You begin coughing up blood.  You have difficulty breathing.  You develop pain which is getting worse or is uncontrolled with medicine.  You have a fever. MAKE SURE YOU:   Understand these  instructions.  Will watch your condition.  Will get help right away if you are not doing well or get worse. Document Released: 01/22/2011 Document Revised: 10/18/2011 Document Reviewed: 01/22/2011 [facility] Patient Information 2014 [place], [place].

## 2013-12-20 NOTE — ED Notes (Signed)
Pt requesting pain medicine.
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
## 2013-12-20 NOTE — ED Notes (Signed)
Pt persistent about getting IV meds instead of pills because she can't digest them. Also wants to eat right away because she hasn't eaten all day.   EDP notified.

## 2013-12-21 ENCOUNTER — Telehealth: Payer: Self-pay | Admitting: Internal Medicine

## 2013-12-21 NOTE — Telephone Encounter (Signed)
Spoke with pt. She went to ED yesterday for high BP and back pain. She had CXR 12/20/13. She reports she needs xray of her back for her back pain. I advised her she would need to see PCP for this as we are her pulmonologist's. She needed nothing further

## 2014-01-05 ENCOUNTER — Other Ambulatory Visit: Payer: Self-pay | Admitting: Internal Medicine

## 2014-01-08 ENCOUNTER — Telehealth: Payer: Self-pay | Admitting: Internal Medicine

## 2014-01-08 MED ORDER — TIOTROPIUM BROMIDE MONOHYDRATE 18 MCG IN CAPS: 18.0000 ug | ORAL_CAPSULE | Freq: Every day | RESPIRATORY_TRACT | Status: DC

## 2014-01-08 NOTE — Telephone Encounter (Signed)
Called spoke with pt. Confirmed RX needed and sent to the pharm. Nothing further needed

## 2014-01-15 ENCOUNTER — Ambulatory Visit: Payer: Medicaid Other | Admitting: Internal Medicine

## 2014-01-16 ENCOUNTER — Ambulatory Visit
Admission: RE | Admit: 2014-01-16 | Discharge: 2014-01-16 | Disposition: A | Discharge status: Home / Self Care | Payer: Medicaid Other | Source: Ambulatory Visit | Attending: Family Medicine | Admitting: Family Medicine

## 2014-01-16 ENCOUNTER — Other Ambulatory Visit: Payer: Self-pay | Admitting: Family Medicine

## 2014-01-16 DIAGNOSIS — M549 Dorsalgia, unspecified: Secondary | ICD-10-CM

## 2014-02-06 ENCOUNTER — Telehealth: Payer: Self-pay | Admitting: Internal Medicine

## 2014-02-06 ENCOUNTER — Other Ambulatory Visit: Payer: Self-pay | Admitting: Internal Medicine

## 2014-02-06 MED ORDER — ALBUTEROL SULFATE HFA 108 (90 BASE) MCG/ACT IN AERS: 2.0000 | INHALATION_SPRAY | RESPIRATORY_TRACT | Status: DC | PRN

## 2014-02-06 MED ORDER — IPRATROPIUM-ALBUTEROL 20-100 MCG/ACT IN AERS: 1.0000 | INHALATION_SPRAY | Freq: Four times a day (QID) | RESPIRATORY_TRACT | Status: DC | PRN

## 2014-02-06 NOTE — Telephone Encounter (Signed)
Last ov w/ CDY 12.9.14: Patient Instructions     Script sent refilling Prilosec  Please keep trying to skip a cigarette every time you can !   Called spoke with patient who verified that she is requesting refills on her Proair and Combivent.  She stated that she typically get 2 proairs per month - verified per last refill.  Pt will call back later this month for follow up appt w/ CDY.  Rx's sent to verified pharmacy.  Nothing further needed at this time; will sign off.

## 2014-02-07 ENCOUNTER — Ambulatory Visit: Payer: Medicaid Other | Admitting: Internal Medicine

## 2014-03-11 ENCOUNTER — Telehealth: Payer: Self-pay | Admitting: Internal Medicine

## 2014-03-11 NOTE — Telephone Encounter (Signed)
Called spoke with pt. She scheduled to see MW wed at 2:15 d/t her schedule and transportation. Nothing further needed

## 2014-03-13 ENCOUNTER — Ambulatory Visit: Payer: Medicaid Other | Admitting: Internal Medicine

## 2014-03-15 ENCOUNTER — Ambulatory Visit (INDEPENDENT_AMBULATORY_CARE_PROVIDER_SITE_OTHER): Payer: Medicaid Other | Admitting: Internal Medicine

## 2014-03-15 ENCOUNTER — Encounter: Payer: Self-pay | Admitting: Internal Medicine

## 2014-03-15 ENCOUNTER — Telehealth: Payer: Self-pay | Admitting: Internal Medicine

## 2014-03-15 ENCOUNTER — Ambulatory Visit (INDEPENDENT_AMBULATORY_CARE_PROVIDER_SITE_OTHER)
Admission: RE | Admit: 2014-03-15 | Discharge: 2014-03-15 | Disposition: A | Discharge status: Home / Self Care | Payer: Medicaid Other | Source: Ambulatory Visit | Attending: Internal Medicine | Admitting: Internal Medicine

## 2014-03-15 VITALS — BP 140/90 | HR 114 | Temp 98.1°F | Ht 62.0 in | Wt 117.0 lb

## 2014-03-15 DIAGNOSIS — I10 Essential (primary) hypertension: Secondary | ICD-10-CM

## 2014-03-15 DIAGNOSIS — F17200 Nicotine dependence, unspecified, uncomplicated: Secondary | ICD-10-CM | POA: Diagnosis not present

## 2014-03-15 DIAGNOSIS — J432 Centrilobular emphysema: Secondary | ICD-10-CM

## 2014-03-15 DIAGNOSIS — J438 Other emphysema: Secondary | ICD-10-CM

## 2014-03-15 MED ORDER — MOMETASONE FURO-FORMOTEROL FUM 100-5 MCG/ACT IN AERO: INHALATION_SPRAY | RESPIRATORY_TRACT | Status: DC

## 2014-03-15 MED ORDER — CLONIDINE HCL 0.2 MG PO TABS: 0.2000 mg | ORAL_TABLET | Freq: Two times a day (BID) | ORAL | Status: DC

## 2014-03-15 NOTE — Telephone Encounter (Signed)
Called and spoke with pt and she stated that [facility] [address] and they did not have her medications.  Pt stated that she now does not have a way back to the pharmacy so they will need to deliver this to her.  i have called [facility] and cancelled the rx for the clonidine but they did not receive the rx for the dulera.  Pt is aware that [facility]s have [facility] [address].

## 2014-03-15 NOTE — Progress Notes (Signed)
Patient ID: Megan Soto, female    DOB: 09-12-1960, 53 y.o.   MRN: [id number]  HPI 01/26/11- 53 year old female smoker followed for COPD complicated by anxiety, DM, GERD, musculoskeletal pain, hepatitis C. Last here 12/19, 2011- note reviewed.  Breathing has done fairly well with supplemental O2 2 L/M . Heat increases her cough and especially bad at night. Out of Combivent till tomorrow. Discussed overlap of these meds. Blames nerves for diffuse somatic pains with a lot of arthritis. Tussive headches. Scant sticky mucus. Uses neb 4x daily. Also on Spiriva- dry mouth.  Up frequently at night, implies diazepam doesn't keep her asleep at night.  Still smoking. Asks for pain meds/ cough syrup- says Megan Soto gave cough syrup 1 month ago. Still gets pain pills from Megan Soto. We have provided her diazepam for sleep and anxiety, which helped to reduce her smoking.  10/07/11- 53 year old female smoker followed for COPD complicated by anxiety, DM, GERD, musculoskeletal pain, hepatitis C She denies acute problems. Reports that 3 weeks ago Megan. Jeannetta Soto did chest x-ray when she was coughing some blood in the sputum. Treated for infection. Sputum has cleared and she feels back to baseline. Continues oxygen 2 L/Advanced used for sleep and exertion. Using albuterol or Combivent rescue inhaler several times a day, nebulizer 2 or 3 times a day plus Spiriva. Reluctant to use Advair because of TV advertisements describing "infection". We discussed over- use of stimulant bronchodilators.  12/24/11- 53 year old female smoker followed for COPD complicated by anxiety, DM, GERD, musculoskeletal pain, hepatitis C Patient c/o sore throat, sob with exertion, wheeizng, and chest tightness.  Trying to keep her smoking down to 1 per hour. Cough rasps and keeps throat sore. occasional blood streak still, intermittent, not progressive. Denies nodes, chest pain. Biggest problem remains her chronic anxiety. She mentioned some  scattered somatic pains but I did not invite request for pain med. Medicaid did not want to do CT without more indication. CXR 11/27/11 IMPRESSION:  COPD. No active disease.  Original Report Authenticated By: Megan Soto, M.D.   01/10/13- 53 year old female smoker followed for COPD complicated by anxiety, DM, GERD, musculoskeletal pain, hepatitis C O2 2 L/Advanced Depressed by family stress. Vomiting last night. Asks I give her an antidepressant. Taking Xanax to help sleep. Pain medicine clinic/ Megan Soto. Nebulizer does help. Continues to smoke one pack per day. Complains of low back pain. CXR 02/22/12 IMPRESSION:  COPD. No active disease.  Original Report Authenticated By: Megan Soto, M.D.  07/17/13-53 year old female smoker followed for COPD complicated by anxiety, DM, GERD, musculoskeletal pain, hepatitis C O2 2 L/Advanced FOLLOWS FOR: breathing is "off and on"; depends on what she is doing. Is having more cramps in lower chest area. Using O2 at times when needed. would like Rx for Prilosec sent to pharmacy. His with wood stove which bothers her breathing. Dyspnea on exertion walking the store but does not have transportation. Discussed flu and pneumonia vaccinations. CXR 01/10/13 IMPRESSION:  1. No acute cardiopulmonary abnormalities.  Original Report Authenticated By: Megan Soto, M.D  6 MWT 04/25/13-- 95%, 96%, 96% 384 meters- quit w/ 20 seconds left, saying legs like jello, unable to walk farther. No oxygen limitation     03/15/2014  Acute/ emergent extended ov/Megan Soto re: cough and hurt/ still smoking / Megan Soto pt/ on acei and spiriva  Chief Complaint  Patient presents with  . Acute Visit    Pt states "I have no idea why I am here". She states "  breathing is just getting real bad". Could not say how long this has been bothering her.    cough chronic but worse  x 4 weeks traces of brp, gen ant cp s localization p coughing fits No sob unless coughing Cough worse day than night,  mostly dry /harsh/ barking quality  Really shaky on names of meds and how to use them  No obvious day to day or daytime variabilty or assoc    chest tightness, subjective wheeze overt sinus or hb symptoms. No unusual exp hx or h/o childhood pna/ asthma or knowledge of premature birth.  Sleeping ok without nocturnal  or early am exacerbation  of respiratory  c/o's or need for noct saba. Also denies any obvious fluctuation of symptoms with weather or environmental changes or other aggravating or alleviating factors except as outlined above   Current Medications, Allergies, Complete Past Medical History, Past Surgical History, Family History, and Social History were reviewed in [facility] electronic medical record.  ROS  The following are not active complaints unless bolded sore throat, dysphagia, dental problems, itching, sneezing,  nasal congestion or excess/ purulent secretions, ear ache,   fever, chills, sweats, unintended wt loss, pleuritic or exertional cp, hemoptysis,  orthopnea pnd or leg swelling, presyncope, palpitations, heartburn, abdominal pain, anorexia, nausea, vomiting, diarrhea  or change in bowel or urinary habits, change in stools or urine, dysuria,hematuria,  rash, arthralgias, visual complaints, headache, numbness weakness or ataxia or problems with walking or coordination,  change in mood/affect or memory.         Objective:   Physical Exam    Wt Readings from Last 3 Encounters:  03/15/14 117 lb (53.071 kg)  12/20/13 116 lb (52.617 kg)  07/17/13 113 lb 9.6 oz (51.529 kg)       HEENT mild turbinate edema.  Oropharynx no thrush or excess pnd or cobblestoning.  No JVD or cervical adenopathy. Mild accessory muscle hypertrophy. Trachea midline, nl thryroid. Chest was hyperinflated by percussion with diminished breath sounds and moderate increased exp time without wheeze. Hoover sign positive at mid inspiration. Regular rate and rhythm without murmur gallop or rub or  increase P2 or edema.  Abd: no hsm, nl excursion. Ext warm without cyanosis or clubbing.     CXR  03/15/2014 :  No active cardiopulmonary disease.

## 2014-03-15 NOTE — Telephone Encounter (Signed)
Per 03/15/14; Start clonidine 0.2 mg three times daily - bfast, supper and bedtime - your breathing and pain and cough should all gradually improve  Start dulera 100 Take 2 puffs first thing in am and then another 2 puffs about 12 hours later.   Per other phone note 03/15/14-[facility]  [facility] and was advise [facility] was [facility] received. Gave VO to the pharm. Pt aware this has been called in but [facility] does not deliver [facility], she will have to pick this up.  Nothing further needed

## 2014-03-15 NOTE — Patient Instructions (Addendum)
Stop spiriva and lisinopril and fish oil  Start clonidine 0.2 mg three times daily - bfast, supper and bedtime - your breathing and pain and cough should all gradually improve  Start dulera 100 Take 2 puffs first thing in am and then another 2 puffs about 12 hours later.   The key is to stop smoking completely before smoking completely stops you!  Please remember to go to the  x-ray department downstairs for your tests - we will call you with the results when they are available  Please schedule a follow up office visit in 2 weeks, sooner if needed  To see Dr Maple HudsonYoung or Dr Sherene SiresWert to recheck

## 2014-03-16 ENCOUNTER — Telehealth: Payer: Self-pay | Admitting: Internal Medicine

## 2014-03-16 DIAGNOSIS — I10 Essential (primary) hypertension: Secondary | ICD-10-CM | POA: Insufficient documentation

## 2014-03-16 NOTE — Assessment & Plan Note (Signed)
ACE inhibitors are problematic in  pts with airway complaints because  even experienced pulmonologists can't always distinguish ace effects from copd/asthma/pnds/ allergies etc.  By themselves they don't actually cause a problem, much like oxygen can't by itself start a fire, but they certainly serve as a powerful catalyst or enhancer for any "fire"  or inflammatory process in the upper airway, be it caused by an ET  tube or more commonly reflux (especially in the obese or pts with known GERD or who are on biphoshonates) or URI's, due to interference with bradykinin clearance.  The effects of acei on bradykinin levels occurs in 100% of pt's on acei (unless they surreptitiously stop the med!) but the classic cough is only reported in 5%.  This leaves 95% of pts on acei's  with a variety of syndromes including no identifiable symptom in most  vs non-specific symptoms that wax and wane depending on what other insult is occuring at the level of the upper airway, like reflux  Given the overlap between acei and her symptoms would avoid that class indefinitely

## 2014-03-16 NOTE — Telephone Encounter (Signed)
Called at around 9am and gave incorrect birthdate and phone number but I realized as reviewed notes from 8/7 that this was a pt I had seen and found the correct number  She states feels the same and confused with instructions, has not implemented any of them including most importantly stopping the acei and starting clonidine which I feel will also help with her anxiety/chronic pain control  Reviewed the instructions again line by line

## 2014-03-16 NOTE — Assessment & Plan Note (Signed)

## 2014-03-16 NOTE — Assessment & Plan Note (Addendum)
-   PFT's 10/23/07  FEV1  1.64 (69%) ratio 55 and dlco 58%  6 MWT 04/25/13-- 95%, 96%, 96% 384 meters- quit w/ 20 seconds left, saying legs like jello, unable to walk farther. No oxygen limitation  So she has at least a GOLD II copd/ still smoking but symptoms are markedly disproportionate to objective findings and not clear this is all a  lung problem but pt does appear to have difficult airway management issues. DDX of  difficult airways management all start with A and  include Adherence, Ace Inhibitors, Acid Reflux, Active Sinus Disease, Alpha 1 Antitripsin deficiency, Anxiety masquerading as Airways dz,  ABPA,  allergy(esp in young), Aspiration (esp in elderly), Adverse effects of DPI,  Active smokers, plus two Bs  = Bronchiectasis and Beta blocker use..and one C= CHF   Adherence is always the initial "prime suspect" and is a multilayered concern that requires a "trust but verify" approach in every patient - starting with knowing how to use medications, especially inhalers, correctly, keeping up with refills and understanding the fundamental difference between maintenance and prns vs those medications only taken for a very short course and then stopped and not refilled.  - The proper method of use, as well as anticipated side effects, of a metered-dose inhaler are discussed and demonstrated to the patient. Improved effectiveness after extensive coaching during this visit to a level of approximately  75%   ACEi leading suspect here > needs try off  ? Adverse effect of dpi > try off spiriva and on dulera 100 2bid (not approved for copd but much gentler on the upper airway than any medications we have samples for her to try)  ? Acid (or non-acid) GERD > always difficult to exclude as up to 75% of pts in some series report no assoc GI/ Heartburn symptoms> rec max (24h)  acid suppression and diet restrictions/ reviewed and instructions given in writing.   See instructions for specific recommendations which  were reviewed directly with the patient who was given a copy with highlighter outlining the key components.

## 2014-03-18 NOTE — Progress Notes (Signed)
Quick Note:  Spoke with pt and notified of results per Dr. Wert. Pt verbalized understanding and denied any questions.  ______ 

## 2014-03-19 ENCOUNTER — Telehealth: Payer: Self-pay | Admitting: Internal Medicine

## 2014-03-19 NOTE — Telephone Encounter (Signed)
This effect gets much better over about a week so use hard rock candy or ice and if still can't tolerate then use one half tid dosing - it should help her pain and anxiety as well as reduce urge to smoke so it's worth sticking with if at all possible

## 2014-03-19 NOTE — Telephone Encounter (Signed)
Called and spoke with pt and she is aware of MW recs.  Pt voiced her understanding and stated that she will call back if needed.

## 2014-03-19 NOTE — Telephone Encounter (Signed)
Per 03/15/14 OV w/ MW: Patient Instructions      Stop spiriva and lisinopril and fish oil  Start clonidine 0.2 mg three times daily - bfast, supper and bedtime - your breathing and pain and cough should all gradually improve Start dulera 100 Take 2 puffs first thing in am and then another 2 puffs about 12 hours later.  The key is to stop smoking completely before smoking completely stops you! Please remember to go to the  x-ray department downstairs for your tests - we will call you with the results when they are available Please schedule a follow up office visit in 2 weeks, sooner if needed  To see Dr Maple HudsonYoung or Dr Sherene SiresWert to recheck    ---  Called spoke with pt. She is taking clonidine 0.2 mg BID. She reports taking it TID was causing severe dry mouth. Pt reports even with taking the medication BID she is still having dry mouth. Requesting recs. Please advise MW thanks

## 2014-03-22 ENCOUNTER — Telehealth: Payer: Self-pay | Admitting: Internal Medicine

## 2014-03-22 NOTE — Telephone Encounter (Signed)
Pt aware of rec's per CY Pt to contact PCP regarding BP and BP meds. Pt states that she will contact them in the AM Pt scheduled for OV CY 04/29/14 at 245p Nothing further needed.

## 2014-03-22 NOTE — Telephone Encounter (Signed)
Pt states that she saw MW--BP meds changed Pt states that her BP has been elevated --checked 03/22/14 at [facility] : systolic 180's Pt states that she is having some chest pain, headaches, upset stomach, tingly arms/pain since meds changed  Patient Instructions     Stop spiriva and lisinopril and fish oil  Start clonidine 0.2 mg three times daily - bfast, supper and bedtime - your breathing and pain and cough should all gradually improve  Start dulera 100 Take 2 puffs first thing in am and then another 2 puffs about 12 hours later.  The key is to stop smoking completely before smoking completely stops you!  Please remember to go to the x-ray department downstairs for your tests - we will call you with the results when they are available  Please schedule a follow up office visit in 2 weeks, sooner if needed To see Dr Maple HudsonYoung or Dr Sherene SiresWert to recheck    Patient states that she does not agree with the changes made at OV with MW. Would like secondary rec's from CY. Feels that her symptoms worsened after this visit  Please advise Dr Maple HudsonYoung. Thanks.

## 2014-03-22 NOTE — Telephone Encounter (Signed)
She will need to ask her PCP (Dr Jeannetta NapElkins?) for advice about BP.   Ok to go back to the breathing meds she was using before and schedule back as bale to see me.

## 2014-03-29 ENCOUNTER — Ambulatory Visit: Payer: Medicaid Other | Admitting: Internal Medicine

## 2014-04-11 ENCOUNTER — Telehealth: Payer: Self-pay | Admitting: Internal Medicine

## 2014-04-11 DIAGNOSIS — J432 Centrilobular emphysema: Secondary | ICD-10-CM

## 2014-04-11 MED ORDER — MOMETASONE FURO-FORMOTEROL FUM 100-5 MCG/ACT IN AERO: INHALATION_SPRAY | RESPIRATORY_TRACT | Status: DC

## 2014-04-11 MED ORDER — AZITHROMYCIN 250 MG PO TABS: ORAL_TABLET | ORAL | Status: DC

## 2014-04-11 MED ORDER — PROMETHAZINE-CODEINE 6.25-10 MG/5ML PO SYRP: 5.0000 mL | ORAL_SOLUTION | Freq: Three times a day (TID) | ORAL | Status: DC | PRN

## 2014-04-11 NOTE — Telephone Encounter (Signed)
I spoke with the pt and notified dulera, cough syrup and zpack were sent  She is aware no xanax was called in  Nothing further needed

## 2014-04-11 NOTE — Telephone Encounter (Signed)
Called and spoke with pt and she stated that she is needing refills of the dulera and the xanax.    Pt stated that she is requesting an abx to be called in to her pharmacy.  She stated that she is having nasal problems on the right side.  She c/o having blisters that are broken out above her lip.  She also c/o cough with sore throat and she can hardly talk.  Pt is also requesting to have a cough med.  CY please advise. Thanks  Last ov--07/16/2013 Next ov--04/29/14  Allergies  Allergen Reactions  . Aspirin Nausea And Vomiting    Current Outpatient Prescriptions on File Prior to Visit  Medication Sig Dispense Refill  . albuterol (PROAIR HFA) 108 (90 BASE) MCG/ACT inhaler Inhale 2 puffs into the lungs every 4 (four) hours as needed for wheezing or shortness of breath.  2 Inhaler  9  . albuterol (PROVENTIL) (2.5 MG/3ML) 0.083% nebulizer solution Take 3 mLs (2.5 mg total) by nebulization every 6 (six) hours as needed. DX 492.8  360 mL  3  . alprazolam (XANAX) 2 MG tablet Take 2-4 mg by mouth at bedtime.       . cloNIDine (CATAPRES) 0.2 MG tablet Take 1 tablet (0.2 mg total) by mouth 2 (two) times daily.  60 tablet  2  . HYDROcodone-acetaminophen (NORCO) 10-325 MG per tablet Take 1 tablet by mouth every 6 (six) hours as needed. pain      . Ipratropium-Albuterol (COMBIVENT) 20-100 MCG/ACT AERS respimat Inhale 1 puff into the lungs every 6 (six) hours as needed for wheezing or shortness of breath. Due to panic attacks  4 g  9  . metFORMIN (GLUCOPHAGE) 500 MG tablet Take 500 mg by mouth 2 (two) times daily with a meal.       . mometasone-formoterol (DULERA) 100-5 MCG/ACT AERO Take 2 puffs first thing in am and then another 2 puffs about 12 hours later.  1 Inhaler  2  . Multiple Vitamin (MULTIVITAMIN WITH MINERALS) TABS Take 1 tablet by mouth daily.      Marland Kitchen omeprazole (PRILOSEC) 20 MG capsule Take 1 capsule (20 mg total) by mouth daily.  30 capsule  prn   No current facility-administered medications on  file prior to visit.

## 2014-04-11 NOTE — Telephone Encounter (Signed)
Patient returning call.  [phone]

## 2014-04-11 NOTE — Telephone Encounter (Signed)
Ok to refill her Xanax                    Rx zpak                    Refill her Dulera                    Prometh codeine 180 ml, 5 ml every 8 hours prn for cough                    Suggest otc Abreva for blisters on lip- sounds like a viral/ herpes sore on her lip

## 2014-04-11 NOTE — Telephone Encounter (Signed)
Rx for Zpak, Elwin Sleight and Promethazine w/ codeine sent to pharmacy.  Spoke with pharmacist and she states pt received Xanax  # 90 tablets from another physician on 04/05/14.  Please advise to hold refill for Xanax of go ahead with refill.

## 2014-04-11 NOTE — Telephone Encounter (Signed)
Cancel the xanax- thanks

## 2014-04-29 ENCOUNTER — Ambulatory Visit: Payer: Medicaid Other | Admitting: Internal Medicine

## 2014-08-06 ENCOUNTER — Telehealth: Payer: Self-pay | Admitting: Internal Medicine

## 2014-08-06 ENCOUNTER — Other Ambulatory Visit: Payer: Self-pay | Admitting: Internal Medicine

## 2014-08-06 DIAGNOSIS — J432 Centrilobular emphysema: Secondary | ICD-10-CM

## 2014-08-06 MED ORDER — PROMETHAZINE HCL 12.5 MG PO TABS: 12.5000 mg | ORAL_TABLET | Freq: Two times a day (BID) | ORAL | Status: DC | PRN

## 2014-08-06 NOTE — Telephone Encounter (Signed)
CY Please advise. Thanks  Patient Instructions     Stop spiriva and lisinopril and fish oil  Start clonidine 0.2 mg three times daily - bfast, supper and bedtime - your breathing and pain and cough should all gradually improve  Start dulera 100 Take 2 puffs first thing in am and then another 2 puffs about 12 hours later.   The key is to stop smoking completely before smoking completely stops you!  Please remember to go to the x-ray department downstairs for your tests - we will call you with the results when they are available  Please schedule a follow up office visit in 2 weeks, sooner if needed To see Dr Maple HudsonYoung or Dr Sherene SiresWert to recheck

## 2014-08-06 NOTE — Telephone Encounter (Signed)
Ok refill her Spiriva, once daily, refill prn  Ok refill phenergan 10 mg, # 30, 1 every 12 hours if needed for nausea.

## 2014-08-06 NOTE — Telephone Encounter (Signed)
Called and spoke to pt. Pt is requesting phergnan because of anxiousness that is causing her to become nauseas. Pt also c/o hoarseness, cough with little mucus production- green mucus, rhinorrhea with green mucus, chest tightness and increase in SOB. Pt denies f/c/s. Pt refused an appt at this time d/t family troubles. Pt stated she would call back once able to make an appt. Pt last seen in 03/2014 by Dr. Sherene SiresWert for an acute visit.   Dr. Maple HudsonYoung please advise.    Allergies  Allergen Reactions  . Aspirin Nausea And Vomiting    Current Outpatient Prescriptions on File Prior to Visit  Medication Sig Dispense Refill  . albuterol (PROAIR HFA) 108 (90 BASE) MCG/ACT inhaler Inhale 2 puffs into the lungs every 4 (four) hours as needed for wheezing or shortness of breath. 2 Inhaler 9  . albuterol (PROVENTIL) (2.5 MG/3ML) 0.083% nebulizer solution Take 3 mLs (2.5 mg total) by nebulization every 6 (six) hours as needed. DX 492.8 360 mL 3  . alprazolam (XANAX) 2 MG tablet Take 2-4 mg by mouth at bedtime.     Marland Kitchen. azithromycin (ZITHROMAX) 250 MG tablet Take 2 tablets today then 1 tablet daily until gone 6 tablet 0  . cloNIDine (CATAPRES) 0.2 MG tablet Take 1 tablet (0.2 mg total) by mouth 2 (two) times daily. 60 tablet 2  . HYDROcodone-acetaminophen (NORCO) 10-325 MG per tablet Take 1 tablet by mouth every 6 (six) hours as needed. pain    . Ipratropium-Albuterol (COMBIVENT) 20-100 MCG/ACT AERS respimat Inhale 1 puff into the lungs every 6 (six) hours as needed for wheezing or shortness of breath. Due to panic attacks 4 g 9  . metFORMIN (GLUCOPHAGE) 500 MG tablet Take 500 mg by mouth 2 (two) times daily with a meal.     . mometasone-formoterol (DULERA) 100-5 MCG/ACT AERO Take 2 puffs first thing in am and then another 2 puffs about 12 hours later. 1 Inhaler 6  . Multiple Vitamin (MULTIVITAMIN WITH MINERALS) TABS Take 1 tablet by mouth daily.    Marland Kitchen. omeprazole (PRILOSEC) 20 MG capsule Take 1 capsule (20 mg total) by  mouth daily. 30 capsule prn  . promethazine-codeine (PHENERGAN WITH CODEINE) 6.25-10 MG/5ML syrup Take 5 mLs by mouth every 8 (eight) hours as needed for cough. 180 mL 0   No current facility-administered medications on file prior to visit.

## 2014-08-06 NOTE — Telephone Encounter (Signed)
Medication Refill     Waymon Budgelinton D Young, MD   Ronny BaconKatie C Noga Fogg, CMA (You)  Less than a minute ago   (2:32 PM)    Ok refill her Spiriva, once daily, refill prn  Ok refill phenergan 10 mg, # 30, 1 every 12 hours if needed for nausea.   Spiriva was sent via Rx refill sent earlier today.

## 2014-08-06 NOTE — Telephone Encounter (Signed)
Also wants Rx for phenergan. She is having issues with her daughter & has been getting sick on her stomach. [phone].

## 2014-08-06 NOTE — Telephone Encounter (Signed)
Called and spoke with pt and she is wanting a refill of the spiriva.  Pt stated that she stoppped the fish oil and she is on the spirva and the lisinopril.  Please advise if this can be refilled for the pt.  She stated that she normally see's CY and did not want to come off of her spiriva.    Last ov--07/2013 Next ov-- none pending  Allergies  Allergen Reactions  . Aspirin Nausea And Vomiting    Current Outpatient Prescriptions on File Prior to Visit  Medication Sig Dispense Refill  . albuterol (PROAIR HFA) 108 (90 BASE) MCG/ACT inhaler Inhale 2 puffs into the lungs every 4 (four) hours as needed for wheezing or shortness of breath. 2 Inhaler 9  . albuterol (PROVENTIL) (2.5 MG/3ML) 0.083% nebulizer solution Take 3 mLs (2.5 mg total) by nebulization every 6 (six) hours as needed. DX 492.8 360 mL 3  . alprazolam (XANAX) 2 MG tablet Take 2-4 mg by mouth at bedtime.     Marland Kitchen. azithromycin (ZITHROMAX) 250 MG tablet Take 2 tablets today then 1 tablet daily until gone 6 tablet 0  . cloNIDine (CATAPRES) 0.2 MG tablet Take 1 tablet (0.2 mg total) by mouth 2 (two) times daily. 60 tablet 2  . HYDROcodone-acetaminophen (NORCO) 10-325 MG per tablet Take 1 tablet by mouth every 6 (six) hours as needed. pain    . Ipratropium-Albuterol (COMBIVENT) 20-100 MCG/ACT AERS respimat Inhale 1 puff into the lungs every 6 (six) hours as needed for wheezing or shortness of breath. Due to panic attacks 4 g 9  . metFORMIN (GLUCOPHAGE) 500 MG tablet Take 500 mg by mouth 2 (two) times daily with a meal.     . mometasone-formoterol (DULERA) 100-5 MCG/ACT AERO Take 2 puffs first thing in am and then another 2 puffs about 12 hours later. 1 Inhaler 6  . Multiple Vitamin (MULTIVITAMIN WITH MINERALS) TABS Take 1 tablet by mouth daily.    Marland Kitchen. omeprazole (PRILOSEC) 20 MG capsule Take 1 capsule (20 mg total) by mouth daily. 30 capsule prn  . promethazine-codeine (PHENERGAN WITH CODEINE) 6.25-10 MG/5ML syrup Take 5 mLs by mouth every 8  (eight) hours as needed for cough. 180 mL 0   No current facility-administered medications on file prior to visit.

## 2014-08-06 NOTE — Telephone Encounter (Signed)
Called and spoke to pt. Informed pt of the recs per CY. Rx sent to preferred pharmacy. Pt also stated her neb machine is broken and needing a replacement. [facility], per Florentina AddisonKatie to order new neb machine. Order placed. Nothing further needed.

## 2014-08-06 NOTE — Telephone Encounter (Signed)
See other phone message from 08/06/14. Will sign off. Nothing further needed.

## 2014-08-07 ENCOUNTER — Other Ambulatory Visit: Payer: Self-pay | Admitting: Internal Medicine

## 2014-08-27 ENCOUNTER — Telehealth: Payer: Self-pay | Admitting: Internal Medicine

## 2014-08-27 NOTE — Telephone Encounter (Signed)
ATC fast busy signal x 3 wcb 

## 2014-08-28 MED ORDER — AZITHROMYCIN 250 MG PO TABS
ORAL_TABLET | ORAL | Status: DC
Start: 2014-08-28 — End: 2014-08-30

## 2014-08-28 NOTE — Telephone Encounter (Signed)
Called and spoke to pt. Informed pt of the recs per CY. Pt began to cry and stated she is depressed. Informed pt she will need to contact her PCP for help and discuss a possible referral to a behavior specialist. Pt continues crying and stated she is tired of begging and pleading for help. I informed pt she needs to contact her PCP and inform them of her position and they will be able to help. Pt verbalized understating and denied any further questions or concerns at this time.

## 2014-08-28 NOTE — Telephone Encounter (Signed)
Attempted call again. Received fast busy signal. Will try back.

## 2014-08-28 NOTE — Telephone Encounter (Signed)
Im sorry , but I can't help with the mood and stress problems. She needs to contact her PCP for that.

## 2014-08-28 NOTE — Telephone Encounter (Signed)
Offer Z pak and suggest Mucinex- DM and extra water to loosen the mucus

## 2014-08-28 NOTE — Telephone Encounter (Signed)
Pt c/o increased mucus production, chest discomfort and SOB. Pt states that the mucus keeps getting stuck in her throat and makes her vomit from gagging.  Pt states that she is getting up thick Megan Soto mucus. Pt states that her SOB is worsened and she is unable to walk long distances.  Denies fever.  [facility] [place] Upcoming appt with CY 10/03/14  Allergies  Allergen Reactions  . Aspirin Nausea And Vomiting   Please advise Dr Maple HudsonYoung. Thanks.

## 2014-08-28 NOTE — Telephone Encounter (Signed)
Called and spoke to pt. Pt stated she is now requesting the zpak, rx sent. Pt also stated she would like Ativan for anxiety. Pt has xanax on her med list and states she only takes those to help her sleep. When asked pt to speak to her PCP about the ativan and anxiety pt stated she already did and her PCP will not giver her anything. Ativan 0.5mg  was last given (per our records) on 06/11/2011 by CY for #90 with 1 refill, sig: 1-2 tab TID prn.   Dr. Maple HudsonYoung please advise.

## 2014-08-28 NOTE — Telephone Encounter (Signed)
Pt called back. Changed her mind. She also wants a z-pack. Ph # [phone]

## 2014-08-28 NOTE — Telephone Encounter (Signed)
Pt refused ZPAK and Mucinex-DM- states that she does not have the money to buy this at this time. Pt states that she doesn't feel that she needs anything like this right now, pt states that her anxiety is causing to be sick. I advised the patient that the symptoms that she described to be is not anxiety and that the abx and Mucinex would help with her increased mucus production. Pt states that she really just needs her Ativan refilled. Pt states that her daughter is going through a horrible divorce and she(the patient) is about to have a "nervous breakdown" because of the stress that is on her family right now. Pt did not mention any of this early this morning in previous telephone message, earlier the patient was requesting an antibiotic be called in to help alleviate her cold symptoms. Pt aware that I will speak with Dr Maple HudsonYoung about her request. Ativan last filled 2012 by CY.   Please advise Dr Maple HudsonYoung. Thanks.  Allergies  Allergen Reactions  . Aspirin Nausea And Vomiting     Medication List       This list is accurate as of: 08/27/14 11:59 PM.  Always use your most recent med list.               albuterol (2.5 MG/3ML) 0.083% nebulizer solution  Commonly known as:  PROVENTIL  Take 3 mLs (2.5 mg total) by nebulization every 6 (six) hours as needed. DX 492.8     albuterol 108 (90 BASE) MCG/ACT inhaler  Commonly known as:  PROAIR HFA  Inhale 2 puffs into the lungs every 4 (four) hours as needed for wheezing or shortness of breath.     alprazolam 2 MG tablet  Commonly known as:  XANAX  Take 2-4 mg by mouth at bedtime.     azithromycin 250 MG tablet  Commonly known as:  ZITHROMAX  Take 2 tablets today then 1 tablet daily until gone     cloNIDine 0.2 MG tablet  Commonly known as:  CATAPRES  Take 1 tablet (0.2 mg total) by mouth 2 (two) times daily.     HYDROcodone-acetaminophen 10-325 MG per tablet  Commonly known as:  NORCO  Take 1 tablet by mouth every 6 (six) hours as needed. pain      Ipratropium-Albuterol 20-100 MCG/ACT Aers respimat  Commonly known as:  COMBIVENT  Inhale 1 puff into the lungs every 6 (six) hours as needed for wheezing or shortness of breath. Due to panic attacks     metFORMIN 500 MG tablet  Commonly known as:  GLUCOPHAGE  Take 500 mg by mouth 2 (two) times daily with a meal.     mometasone-formoterol 100-5 MCG/ACT Aero  Commonly known as:  DULERA  Take 2 puffs first thing in am and then another 2 puffs about 12 hours later.     multivitamin with minerals Tabs tablet  Take 1 tablet by mouth daily.     omeprazole 20 MG capsule  Commonly known as:  PRILOSEC  TAKE ONE CAPSULE BY MOUTH DAILY     promethazine 12.5 MG tablet  Commonly known as:  PHENERGAN  Take 1 tablet (12.5 mg total) by mouth every 12 (twelve) hours as needed for nausea or vomiting.     promethazine-codeine 6.25-10 MG/5ML syrup  Commonly known as:  PHENERGAN with CODEINE  Take 5 mLs by mouth every 8 (eight) hours as needed for cough.     SPIRIVA HANDIHALER 18 MCG inhalation capsule  Generic drug:  tiotropium  PLACE 1 CAPSULE INTO INHALER AND INHALE ONCE DAILY AS DIRECTED

## 2014-08-30 ENCOUNTER — Emergency Department (HOSPITAL_COMMUNITY)
Admission: EM | Admit: 2014-08-30 | Discharge: 2014-08-30 | Disposition: A | Discharge status: Home / Self Care | Payer: Medicaid Other | Attending: Emergency Medicine | Admitting: Emergency Medicine

## 2014-08-30 ENCOUNTER — Emergency Department (HOSPITAL_COMMUNITY): Payer: Medicaid Other

## 2014-08-30 ENCOUNTER — Encounter (HOSPITAL_COMMUNITY): Payer: Self-pay | Admitting: Emergency Medicine

## 2014-08-30 DIAGNOSIS — F329 Major depressive disorder, single episode, unspecified: Secondary | ICD-10-CM | POA: Insufficient documentation

## 2014-08-30 DIAGNOSIS — R109 Unspecified abdominal pain: Secondary | ICD-10-CM | POA: Diagnosis present

## 2014-08-30 DIAGNOSIS — Z7951 Long term (current) use of inhaled steroids: Secondary | ICD-10-CM | POA: Diagnosis not present

## 2014-08-30 DIAGNOSIS — R0789 Other chest pain: Secondary | ICD-10-CM | POA: Diagnosis not present

## 2014-08-30 DIAGNOSIS — R600 Localized edema: Secondary | ICD-10-CM | POA: Insufficient documentation

## 2014-08-30 DIAGNOSIS — M159 Polyosteoarthritis, unspecified: Secondary | ICD-10-CM | POA: Diagnosis not present

## 2014-08-30 DIAGNOSIS — F419 Anxiety disorder, unspecified: Secondary | ICD-10-CM | POA: Insufficient documentation

## 2014-08-30 DIAGNOSIS — R1030 Lower abdominal pain, unspecified: Secondary | ICD-10-CM | POA: Insufficient documentation

## 2014-08-30 DIAGNOSIS — I10 Essential (primary) hypertension: Secondary | ICD-10-CM | POA: Diagnosis not present

## 2014-08-30 DIAGNOSIS — G8929 Other chronic pain: Secondary | ICD-10-CM | POA: Diagnosis not present

## 2014-08-30 DIAGNOSIS — M545 Low back pain: Secondary | ICD-10-CM | POA: Insufficient documentation

## 2014-08-30 DIAGNOSIS — E119 Type 2 diabetes mellitus without complications: Secondary | ICD-10-CM | POA: Diagnosis not present

## 2014-08-30 DIAGNOSIS — Z8619 Personal history of other infectious and parasitic diseases: Secondary | ICD-10-CM | POA: Insufficient documentation

## 2014-08-30 DIAGNOSIS — R000 Tachycardia, unspecified: Secondary | ICD-10-CM | POA: Diagnosis not present

## 2014-08-30 DIAGNOSIS — K219 Gastro-esophageal reflux disease without esophagitis: Secondary | ICD-10-CM | POA: Insufficient documentation

## 2014-08-30 DIAGNOSIS — M549 Dorsalgia, unspecified: Secondary | ICD-10-CM

## 2014-08-30 DIAGNOSIS — Z79899 Other long term (current) drug therapy: Secondary | ICD-10-CM | POA: Diagnosis not present

## 2014-08-30 DIAGNOSIS — J441 Chronic obstructive pulmonary disease with (acute) exacerbation: Secondary | ICD-10-CM | POA: Insufficient documentation

## 2014-08-30 DIAGNOSIS — Z792 Long term (current) use of antibiotics: Secondary | ICD-10-CM | POA: Insufficient documentation

## 2014-08-30 DIAGNOSIS — Z720 Tobacco use: Secondary | ICD-10-CM | POA: Diagnosis not present

## 2014-08-30 LAB — URINALYSIS, ROUTINE W REFLEX MICROSCOPIC
Bilirubin Urine: NEGATIVE
Glucose, UA: NEGATIVE mg/dL
Hgb urine dipstick: NEGATIVE
Ketones, ur: NEGATIVE mg/dL
Nitrite: NEGATIVE
Protein, ur: NEGATIVE mg/dL
Specific Gravity, Urine: 1.013 (ref 1.005–1.030)
Urobilinogen, UA: 0.2 mg/dL (ref 0.0–1.0)
pH: 8 (ref 5.0–8.0)

## 2014-08-30 LAB — COMPREHENSIVE METABOLIC PANEL
ALT: 50 U/L — ABNORMAL HIGH (ref 0–35)
AST: 42 U/L — ABNORMAL HIGH (ref 0–37)
Albumin: 3.5 g/dL (ref 3.5–5.2)
Alkaline Phosphatase: 103 U/L (ref 39–117)
Anion gap: 6 (ref 5–15)
BUN: <3 mg/dL — ABNORMAL LOW (ref 6–23)
CO2: 29 mmol/L (ref 19–32)
Calcium: 9 mg/dL (ref 8.4–10.5)
Chloride: 104 mmol/L (ref 96–112)
Creatinine, Ser: 0.68 mg/dL (ref 0.50–1.10)
GFR calc Af Amer: >90 mL/min (ref >90)
GFR calc non Af Amer: >90 mL/min (ref >90)
Glucose, Bld: 94 mg/dL (ref 70–99)
Potassium: 4.1 mmol/L (ref 3.5–5.1)
Sodium: 139 mmol/L (ref 135–145)
Total Bilirubin: 0.6 mg/dL (ref 0.3–1.2)
Total Protein: 6.6 g/dL (ref 6.0–8.3)

## 2014-08-30 LAB — CBC WITH DIFFERENTIAL/PLATELET
Basophils Absolute: 0 10*3/uL (ref 0.0–0.1)
Basophils Relative: 0 % (ref 0–1)
Eosinophils Absolute: 0.1 10*3/uL (ref 0.0–0.7)
Eosinophils Relative: 1 % (ref 0–5)
HCT: 41.8 % (ref 36.0–46.0)
Hemoglobin: 13.8 g/dL (ref 12.0–15.0)
Lymphocytes Relative: 23 % (ref 12–46)
Lymphs Abs: 1.2 10*3/uL (ref 0.7–4.0)
MCH: 31.3 pg (ref 26.0–34.0)
MCHC: 33 g/dL (ref 30.0–36.0)
MCV: 94.8 fL (ref 78.0–100.0)
Monocytes Absolute: 0.6 10*3/uL (ref 0.1–1.0)
Monocytes Relative: 11 % (ref 3–12)
Neutro Abs: 3.5 10*3/uL (ref 1.7–7.7)
Neutrophils Relative %: 65 % (ref 43–77)
Platelets: 216 10*3/uL (ref 150–400)
RBC: 4.41 MIL/uL (ref 3.87–5.11)
RDW: 13.7 % (ref 11.5–15.5)
WBC: 5.4 10*3/uL (ref 4.0–10.5)

## 2014-08-30 LAB — URINE MICROSCOPIC-ADD ON

## 2014-08-30 LAB — TROPONIN I: Troponin I: <0.03 ng/mL (ref <0.031)

## 2014-08-30 LAB — LIPASE, BLOOD: Lipase: 22 U/L (ref 11–59)

## 2014-08-30 MED ORDER — HYDROMORPHONE HCL 1 MG/ML IJ SOLN
0.5000 mg | Freq: Once | INTRAMUSCULAR | Status: AC
  Administered 2014-08-30: 0.5 mg via INTRAVENOUS

## 2014-08-30 MED ORDER — HYDROMORPHONE HCL 1 MG/ML IJ SOLN
1.0000 mg | Freq: Once | INTRAMUSCULAR | Status: DC
  Filled 2014-08-30: qty 1

## 2014-08-30 NOTE — ED Notes (Signed)
Called [facility] 
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
## 2014-08-30 NOTE — Discharge Instructions (Signed)

## 2014-08-30 NOTE — ED Notes (Signed)
Pt arrived via [facility], c/o back pain worsening since Wednesday with tenderness in abdomen, c/o pain in calves bilat.  Pt reports swelling bilat legs from knees to ankles, swelling in hands bilat  Pt reports hx of bulging discs. Pt reports positve for Hep C.  EMS reports pt became lethergic for approx 5 minutes, pt states she was praying.  Pt reports living on 2L O2 for COPD.  AOx4,  moving all 4 extremities.

## 2014-09-06 ENCOUNTER — Other Ambulatory Visit: Payer: Self-pay | Admitting: Internal Medicine

## 2014-09-11 ENCOUNTER — Other Ambulatory Visit (HOSPITAL_COMMUNITY): Payer: Self-pay | Admitting: Nurse Practitioner

## 2014-09-11 DIAGNOSIS — B182 Chronic viral hepatitis C: Secondary | ICD-10-CM

## 2014-09-30 ENCOUNTER — Telehealth: Payer: Self-pay | Admitting: Internal Medicine

## 2014-09-30 ENCOUNTER — Ambulatory Visit (HOSPITAL_COMMUNITY): Payer: Medicaid Other

## 2014-09-30 NOTE — Telephone Encounter (Signed)
You gave correct advice

## 2014-09-30 NOTE — Telephone Encounter (Signed)
Spoke with pt. States she needs something called in for her being sick on her stomach. Advised her that she needed to call her PCP. She stated, "It takes to long to get them on the phone. I'm not calling them."  Allergies  Allergen Reactions  . Aspirin Nausea And Vomiting    [facility] - please advise. Thanks.

## 2014-09-30 NOTE — Telephone Encounter (Signed)
Pt is aware that she needs to call her PCP.

## 2014-10-01 ENCOUNTER — Ambulatory Visit (HOSPITAL_COMMUNITY)
Admission: RE | Admit: 2014-10-01 | Discharge: 2014-10-01 | Disposition: A | Discharge status: Home / Self Care | Payer: Medicaid Other | Source: Ambulatory Visit | Attending: Nurse Practitioner | Admitting: Nurse Practitioner

## 2014-10-01 DIAGNOSIS — B182 Chronic viral hepatitis C: Secondary | ICD-10-CM | POA: Diagnosis present

## 2014-10-01 LAB — GLUCOSE, CAPILLARY: Glucose-Capillary: 89 mg/dL (ref 70–99)

## 2014-10-03 ENCOUNTER — Ambulatory Visit: Payer: Medicaid Other | Admitting: Internal Medicine

## 2014-10-08 ENCOUNTER — Other Ambulatory Visit: Payer: Self-pay | Admitting: Internal Medicine

## 2014-11-07 ENCOUNTER — Other Ambulatory Visit: Payer: Self-pay | Admitting: Internal Medicine

## 2014-11-11 ENCOUNTER — Ambulatory Visit: Payer: Medicaid Other | Admitting: Internal Medicine

## 2014-12-03 ENCOUNTER — Other Ambulatory Visit: Payer: Self-pay | Admitting: Internal Medicine

## 2014-12-20 ENCOUNTER — Telehealth: Payer: Self-pay | Admitting: Internal Medicine

## 2014-12-20 NOTE — Telephone Encounter (Signed)
Spoke with pt. She is needing refills on Norco, Soma and Alprazolam. Her pain management MD is not in the office this week. Advised her that she needs to contact her PCP because [facility] is not going to fill these for her. When I told her this she got upset and hung up. Nothing further was needed.

## 2015-01-07 ENCOUNTER — Other Ambulatory Visit: Payer: Self-pay | Admitting: Internal Medicine

## 2015-01-09 ENCOUNTER — Encounter: Payer: Self-pay | Admitting: Internal Medicine

## 2015-01-09 ENCOUNTER — Ambulatory Visit (INDEPENDENT_AMBULATORY_CARE_PROVIDER_SITE_OTHER): Payer: Medicaid Other | Admitting: Internal Medicine

## 2015-01-09 VITALS — BP 110/74 | HR 75 | Ht 62.0 in | Wt 110.0 lb

## 2015-01-09 DIAGNOSIS — Z720 Tobacco use: Secondary | ICD-10-CM

## 2015-01-09 DIAGNOSIS — F17200 Nicotine dependence, unspecified, uncomplicated: Secondary | ICD-10-CM

## 2015-01-09 MED ORDER — ALBUTEROL SULFATE (2.5 MG/3ML) 0.083% IN NEBU: 2.5000 mg | INHALATION_SOLUTION | Freq: Four times a day (QID) | RESPIRATORY_TRACT | Status: DC | PRN

## 2015-01-09 MED ORDER — MOMETASONE FURO-FORMOTEROL FUM 100-5 MCG/ACT IN AERO: INHALATION_SPRAY | RESPIRATORY_TRACT | Status: DC

## 2015-01-09 MED ORDER — TIOTROPIUM BROMIDE MONOHYDRATE 18 MCG IN CAPS: ORAL_CAPSULE | RESPIRATORY_TRACT | Status: DC

## 2015-01-09 MED ORDER — IPRATROPIUM-ALBUTEROL 20-100 MCG/ACT IN AERS: INHALATION_SPRAY | RESPIRATORY_TRACT | Status: DC

## 2015-01-09 MED ORDER — ALBUTEROL SULFATE HFA 108 (90 BASE) MCG/ACT IN AERS: INHALATION_SPRAY | RESPIRATORY_TRACT | Status: DC

## 2015-01-09 NOTE — Assessment & Plan Note (Signed)
Smoking cessation support encouraged

## 2015-01-09 NOTE — Assessment & Plan Note (Signed)
Good control recently with no acute exacerbation through winter, Spring Strong encouragement to stopsmoking. Plan- med refills sent.

## 2015-01-09 NOTE — Patient Instructions (Signed)
All breathing med refills sent to pharmacy Please call as needed

## 2015-01-09 NOTE — Progress Notes (Signed)
Patient ID: Megan Soto, female    DOB: [birth year], 54 y.o.   MRN: [id number]  HPI 01/26/11- 54 year old female smoker followed for COPD complicated by anxiety, DM, GERD, musculoskeletal pain, hepatitis C. Last here 12/19, 2011- note reviewed.  Breathing has done fairly well with supplemental O2 2 L/M . Heat increases her cough and especially bad at night. Out of Combivent till tomorrow. Discussed overlap of these meds. Blames nerves for diffuse somatic pains with a lot of arthritis. Tussive headches. Scant sticky mucus. Uses neb 4x daily. Also on Spiriva- dry mouth.  Up frequently at night, implies diazepam doesn't keep her asleep at night.  Still smoking. Asks for pain meds/ cough syrup- says Dr Jeannetta NapElkins> gave cough syrup 1 month ago. Still gets pain pills from Dr Thurmond ButtsKpeglo. We have provided her diazepam for sleep and anxiety, which helped to reduce her smoking.  10/07/11- 54 year old female smoker followed for COPD complicated by anxiety, DM, GERD, musculoskeletal pain, hepatitis C She denies acute problems. Reports that 3 weeks ago Dr. Jeannetta NapElkins did chest x-ray when she was coughing some blood in the sputum. Treated for infection. Sputum has cleared and she feels back to baseline. Continues oxygen 2 L/Advanced used for sleep and exertion. Using albuterol or Combivent rescue inhaler several times a day, nebulizer 2 or 3 times a day plus Spiriva. Reluctant to use Advair because of TV advertisements describing "infection". We discussed over- use of stimulant bronchodilators.  12/24/11- 54 year old female smoker followed for COPD complicated by anxiety, DM, GERD, musculoskeletal pain, hepatitis C Patient c/o sore throat, sob with exertion, wheeizng, and chest tightness.  Trying to keep her smoking down to 1 per hour. Cough rasps and keeps throat sore. occasional blood streak still, intermittent, not progressive. Denies nodes, chest pain. Biggest problem remains her chronic anxiety. She mentioned some  scattered somatic pains but I did not invite request for pain med. Medicaid did not want to do CT without more indication. CXR 11/27/11 IMPRESSION:  COPD. No active disease.  Original Report Authenticated By: Danae OrleansJOHN A. STAHL, M.D.   01/10/13- 54 year old female smoker followed for COPD complicated by anxiety, DM, GERD, musculoskeletal pain, hepatitis C O2 2 L/Advanced Depressed by family stress. Vomiting last night. Asks I give her an antidepressant. Taking Xanax to help sleep. Pain medicine clinic/ Dr Thurmond ButtsKpeglo. Nebulizer does help. Continues to smoke one pack per day. Complains of low back pain. CXR 02/22/12 IMPRESSION:  COPD. No active disease.  Original Report Authenticated By: Cyndie ChimeKEVIN G. DOVER, M.D.  07/17/13-54 year old female smoker followed for COPD complicated by anxiety, DM, GERD, musculoskeletal pain, hepatitis C O2 2 L/Advanced FOLLOWS FOR: breathing is "off and on"; depends on what she is doing. Is having more cramps in lower chest area. Using O2 at times when needed. would like Rx for Prilosec sent to pharmacy. His with wood stove which bothers her breathing. Dyspnea on exertion walking the store but does not have transportation. Discussed flu and pneumonia vaccinations. CXR 01/10/13 IMPRESSION:  1. No acute cardiopulmonary abnormalities.  Original Report Authenticated By: Signa Kellaylor Stroud, M.D  6 MWT 04/25/13-- 95%, 96%, 96% 384 meters- quit w/ 20 seconds left, saying legs like jello, unable to walk farther. No oxygen limitation  03/15/2014  Acute/ emergent extended ov/Wert re: cough and hurt/ still smoking / Dr Maple HudsonYoung pt/ on acei and spiriva  Chief Complaint  Patient presents with  . Acute Visit    Pt states "I have no idea why I am here". She states "breathing is just  getting real bad". Could not say how long this has been bothering her.    cough chronic but worse  x 4 weeks traces of brp, gen ant cp s localization p coughing fits No sob unless coughing Cough worse day than night, mostly  dry /harsh/ barking quality  Really shaky on names of meds and how to use them CXR  03/15/2014 :  No active cardiopulmonary disease.  01/08/15- 54 year old female smoker followed for COPD complicated by anxiety, DM, GERD, musculoskeletal pain, hepatitis C ER visit 08/30/14- abdominal and back pain Report: has not had pain pills in about a month, wheezing, SOB, wears o2 sometimes during anxiety fits,coughing white, non-bloody sputum. Smoking at least 1/2 ppd "I'm so stressed". Hinted desire for pain meds- deferred to her pain clinic. Today c/o woke in AM with pleuritic, tussive sternal angle pain, tender to touch. "Eating tons of aspirin" Using only combivent inhaler- out of others. Needed form filled out for bus transportation- done and faxed.   ROS-see HPI   Negative unless "+" Constitutional:    weight loss, night sweats, fevers, chills, fatigue, lassitude. HEENT:    headaches, difficulty swallowing, tooth/dental problems, sore throat,       sneezing, itching, ear ache, nasal congestion, post nasal drip, snoring CV:    chest pain, orthopnea, PND, swelling in lower extremities, anasarca,                                  dizziness, palpitations Resp:   shortness of breath with exertion or at rest.                productive cough,   non-productive cough, coughing up of blood.              change in color of mucus.  wheezing.   Skin:    rash or lesions. GI:  No-   heartburn, indigestion, abdominal pain, nausea, vomiting, diarrhea,                 change in bowel habits, loss of appetite GU: dysuria, change in color of urine, no urgency or frequency.   flank pain. MS:   joint pain, stiffness, decreased range of motion, back pain. Neuro-     nothing unusual Psych:  change in mood or affect.  depression or anxiety.   memory loss.  OBJ- Physical Exam General- Alert, Oriented, Affect-pleasant/ mildly anxious, Distress- none acute Skin- rash-none, lesions- none, excoriation- none Lymphadenopathy-  none Head- atraumatic            Eyes- Gross vision intact, PERRLA, conjunctivae and secretions clear            Ears- Hearing, canals-normal            Nose- Clear, no-Septal dev, mucus, polyps, erosion, perforation             Throat- Mallampati II , mucosa clear , drainage- none, tonsils- atrophic Neck- flexible , trachea midline, no stridor , thyroid nl, carotid no bruit Chest - symmetrical excursion , unlabored           Heart/CV- RRR , no murmur , no gallop  , no rub, nl s1 s2                           - JVD- none , edema- none, stasis changes- none, varices- none  Lung- clear to P&A, wheeze- none, cough- none , dullness-none, rub- none           Chest wall-  Abd-  Br/ Gen/ Rectal- Not done, not indicated Extrem- cyanosis- none, clubbing, none, atrophy- none, strength- nl Neuro- grossly intact to observation

## 2015-02-07 ENCOUNTER — Other Ambulatory Visit: Payer: Self-pay | Admitting: Internal Medicine

## 2015-05-07 ENCOUNTER — Telehealth: Payer: Self-pay | Admitting: Internal Medicine

## 2015-05-07 NOTE — Telephone Encounter (Signed)
Spoke with pt and she is requesting xanax refill. Dr. Maple Hudson does not refill this for her but her pain manag doc does. Advised pt she needed to contact them. Nothing further needed

## 2015-06-05 ENCOUNTER — Other Ambulatory Visit: Payer: Self-pay | Admitting: Internal Medicine

## 2015-06-09 ENCOUNTER — Telehealth: Payer: Self-pay | Admitting: Internal Medicine

## 2015-06-09 NOTE — Telephone Encounter (Signed)
Called and spoke to pt. Pt c/o increase in SOB (audible dyspnea over the phone, pt only able to say 3-5 words without becoming very dyspneic), increase in cough with white mucus, pt states she has difficulty bringing mucus up and becomes dizzy when coughing, wheezing, and sweats (has not taken temperature) x 1 week. Pt denies CP/tightness, f/c/s. Pt states she has been using her O2 more often than normal (2lpm). By the sound of the pt over the phone I advised pt to go to ED. Pt refused. Appt made with [facility] on 11.1.16. Pt verbalized understanding and denied any further questions or concerns at this time.

## 2015-06-10 ENCOUNTER — Encounter: Payer: Self-pay | Admitting: Internal Medicine

## 2015-06-10 ENCOUNTER — Ambulatory Visit (INDEPENDENT_AMBULATORY_CARE_PROVIDER_SITE_OTHER): Payer: Medicaid Other | Admitting: Internal Medicine

## 2015-06-10 ENCOUNTER — Other Ambulatory Visit: Payer: Self-pay | Admitting: Internal Medicine

## 2015-06-10 ENCOUNTER — Telehealth: Payer: Self-pay | Admitting: Pulmonary Disease

## 2015-06-10 VITALS — BP 122/60 | HR 114 | Ht 62.0 in | Wt 104.2 lb

## 2015-06-10 DIAGNOSIS — J432 Centrilobular emphysema: Secondary | ICD-10-CM

## 2015-06-10 DIAGNOSIS — J449 Chronic obstructive pulmonary disease, unspecified: Secondary | ICD-10-CM

## 2015-06-10 DIAGNOSIS — G4700 Insomnia, unspecified: Secondary | ICD-10-CM | POA: Diagnosis not present

## 2015-06-10 DIAGNOSIS — J209 Acute bronchitis, unspecified: Secondary | ICD-10-CM | POA: Diagnosis not present

## 2015-06-10 DIAGNOSIS — Z720 Tobacco use: Secondary | ICD-10-CM

## 2015-06-10 MED ORDER — METHYLPREDNISOLONE ACETATE 80 MG/ML IJ SUSP
80.0000 mg | Freq: Once | INTRAMUSCULAR | Status: AC
  Administered 2015-06-10: 80 mg via INTRAMUSCULAR

## 2015-06-10 MED ORDER — ALPRAZOLAM 2 MG PO TABS: 4.0000 mg | ORAL_TABLET | Freq: Every day | ORAL | Status: DC

## 2015-06-10 MED ORDER — OMEPRAZOLE 20 MG PO CPDR: 20.0000 mg | DELAYED_RELEASE_CAPSULE | Freq: Every day | ORAL | Status: DC

## 2015-06-10 MED ORDER — PREDNISONE 10 MG PO TABS: ORAL_TABLET | ORAL | Status: DC

## 2015-06-10 NOTE — Patient Instructions (Addendum)
   Depo 80  Script for prednisone with one refill sent  Script for limited supply this time of xanax  only until you start new pain clinic tomorrow

## 2015-06-10 NOTE — Assessment & Plan Note (Signed)
Clear impression at this visit that her strongest pressure is too maintaining her supply of Xanax and Soma. I agreed to give her only 3 days worth of Xanax for sleep, pending transition to the new pain clinic she claims she will see tomorrow. I did not refill her Tresa GarterSoma

## 2015-06-10 NOTE — Assessment & Plan Note (Signed)
Continues to smoke without any significant effort to stop. Social pressures remain. Counseled again.

## 2015-06-10 NOTE — Progress Notes (Signed)
Patient ID: Megan Soto, female    DOB: 25-Dec-1960, 54 y.o.   MRN: [id number]  HPI 01/26/11- 54 year old female smoker followed for COPD complicated by anxiety, DM, GERD, musculoskeletal pain, hepatitis C. Last here 12/19, 2011- note reviewed.  Breathing has done fairly well with supplemental O2 2 L/M . Heat increases her cough and especially bad at night. Out of Combivent till tomorrow. Discussed overlap of these meds. Blames nerves for diffuse somatic pains with a lot of arthritis. Tussive headches. Scant sticky mucus. Uses neb 4x daily. Also on Spiriva- dry mouth.  Up frequently at night, implies diazepam doesn't keep her asleep at night.  Still smoking. Asks for pain meds/ cough syrup- says Dr Jeannetta Nap gave cough syrup 1 month ago. Still gets pain pills from Dr Thurmond Butts. We have provided her diazepam for sleep and anxiety, which helped to reduce her smoking.  10/07/11- 54 year old female smoker followed for COPD complicated by anxiety, DM, GERD, musculoskeletal pain, hepatitis C She denies acute problems. Reports that 3 weeks ago Dr. Jeannetta Nap did chest x-ray when she was coughing some blood in the sputum. Treated for infection. Sputum has cleared and she feels back to baseline. Continues oxygen 2 L/Advanced used for sleep and exertion. Using albuterol or Combivent rescue inhaler several times a day, nebulizer 2 or 3 times a day plus Spiriva. Reluctant to use Advair because of TV advertisements describing "infection". We discussed over- use of stimulant bronchodilators.  12/24/11- 54 year old female smoker followed for COPD complicated by anxiety, DM, GERD, musculoskeletal pain, hepatitis C Patient c/o sore throat, sob with exertion, wheeizng, and chest tightness.  Trying to keep her smoking down to 1 per hour. Cough rasps and keeps throat sore. occasional blood streak still, intermittent, not progressive. Denies nodes, chest pain. Biggest problem remains her chronic anxiety. She mentioned some  scattered somatic pains but I did not invite request for pain med. Medicaid did not want to do CT without more indication. CXR 11/27/11 IMPRESSION:  COPD. No active disease.  Original Report Authenticated By: Danae Orleans, M.D.   01/10/13- 54 year old female smoker followed for COPD complicated by anxiety, DM, GERD, musculoskeletal pain, hepatitis C O2 2 L/Advanced Depressed by family stress. Vomiting last night. Asks I give her an antidepressant. Taking Xanax to help sleep. Pain medicine clinic/ Dr Thurmond Butts. Nebulizer does help. Continues to smoke one pack per day. Complains of low back pain. CXR 02/22/12 IMPRESSION:  COPD. No active disease.  Original Report Authenticated By: Cyndie Chime, M.D.  07/17/13-54 year old female smoker followed for COPD complicated by anxiety, DM, GERD, musculoskeletal pain, hepatitis C O2 2 L/Advanced FOLLOWS FOR: breathing is "off and on"; depends on what she is doing. Is having more cramps in lower chest area. Using O2 at times when needed. would like Rx for Prilosec sent to pharmacy. His with wood stove which bothers her breathing. Dyspnea on exertion walking the store but does not have transportation. Discussed flu and pneumonia vaccinations. CXR 01/10/13 IMPRESSION:  1. No acute cardiopulmonary abnormalities.  Original Report Authenticated By: Signa Kell, M.D  6 MWT 04/25/13-- 95%, 96%, 96% 384 meters- quit w/ 20 seconds left, saying legs like jello, unable to walk farther. No oxygen limitation  03/15/2014  Acute/ emergent extended ov/Wert re: cough and hurt/ still smoking / Dr Maple Hudson pt/ on acei and spiriva  Chief Complaint  Patient presents with  . Acute Visit    Pt states "I have no idea why I am here". She states "breathing is just  getting real bad". Could not say how long this has been bothering her.    cough chronic but worse  x 4 weeks traces of brp, gen ant cp s localization p coughing fits No sob unless coughing Cough worse day than night, mostly  dry /harsh/ barking quality  Really shaky on names of meds and how to use them CXR  03/15/2014 :  No active cardiopulmonary disease.  01/08/15- 54 year old female smoker followed for COPD complicated by anxiety, DM, GERD, musculoskeletal pain, hepatitis C ER visit 08/30/14- abdominal and back pain Report: has not had pain pills in about a month, wheezing, SOB, wears o2 sometimes during anxiety fits,coughing white, non-bloody sputum. Smoking at least 1/2 ppd "I'm so stressed". Hinted desire for pain meds- deferred to her pain clinic. Today c/o woke in AM with pleuritic, tussive sternal angle pain, tender to touch. "Eating tons of aspirin" Using only combivent inhaler- out of others. Needed form filled out for bus transportation- done and faxed.   06/10/15- 54 year old female smoker followed for COPD complicated by anxiety, DM, GERD, musculoskeletal pain, hepatitis C ACUTE VISIT: for about 1 week having increased chest congestion with tightness, cough, SOB and wheezing. O2 2 L/Advanced Patient reports acute exacerbation in the past week with nonproductive cough, wheeze, tussive bilateral rib soreness. Uses oxygen at home but came without it. Has nebulizer machine at home but hasn't used today. She was seeking refills of Xanax and Soma saying her previous pain doctor has gone out of practice and she is starting with a new pain clinic tomorrow. CXR 08/30/14 IMPRESSION: No active cardiopulmonary disease. Electronically Signed  By: Ruel Favors M.D.  On: 08/30/2014 15:28  ROS-see HPI   Negative unless "+" Constitutional:    weight loss, night sweats, fevers, chills, fatigue, lassitude. HEENT:    headaches, difficulty swallowing, tooth/dental problems, sore throat,       sneezing, itching, ear ache, nasal congestion, post nasal drip, snoring CV:    chest pain, orthopnea, PND, swelling in lower extremities, anasarca,                                               dizziness, palpitations Resp:    shortness of breath with exertion or at rest.                productive cough,   + non-productive cough, coughing up of blood.              change in color of mucus.  + wheezing.   Skin:    rash or lesions. GI:  No-   heartburn, indigestion, abdominal pain, nausea, vomiting, GU: dysuria, change in color of urine, no urgency or frequency.   flank pain. MS:  + joint pain, stiffness, decreased range of motion, back pain. Neuro-     nothing unusual Psych:  change in mood or affect.  depression or anxiety.   memory loss.  OBJ- Physical Exam General- Alert, Oriented, Affect-pleasant/ mildly anxious, Distress- none acute Skin- rash-none, lesions- none, excoriation- none Lymphadenopathy- none Head- atraumatic            Eyes- Gross vision intact, PERRLA, conjunctivae and secretions clear            Ears- Hearing, canals-normal            Nose- Clear, no-Septal dev, mucus, polyps, erosion, perforation  Throat- Mallampati II , mucosa clear , drainage- none, tonsils- atrophic Neck- flexible , trachea midline, no stridor , thyroid nl, carotid no bruit Chest - symmetrical excursion , unlabored           Heart/CV- RRR , no murmur , no gallop  , no rub, nl s1 s2                           - JVD- none , edema- none, stasis changes- none, varices- none           Lung- + hard coughing, wheeze + bilateral,  , dullness-none, rub- none           Chest wall-  Abd-  Br/ Gen/ Rectal- Not done, not indicated Extrem- cyanosis- none, clubbing, none, atrophy- none, strength- nl Neuro- grossly intact to observation

## 2015-06-10 NOTE — Assessment & Plan Note (Signed)
Acute exacerbation with bronchitis. Possible viral infection, doubt bacterial. Plan-Depo-Medrol, prescription for prednisone taper. Defer flu vaccine until over this acute illness

## 2015-06-10 NOTE — Telephone Encounter (Signed)
Called spoke with pt. She needs refill on omeprazole. I have sent this in. Nothing further needed

## 2015-06-16 ENCOUNTER — Telehealth: Payer: Self-pay | Admitting: Internal Medicine

## 2015-06-16 NOTE — Telephone Encounter (Signed)
Called made pt aware. Nothing further needed 

## 2015-06-16 NOTE — Telephone Encounter (Signed)
Called spoke with pt. She reports she saw pain clinic 11/2 and did not give any refills on xanax. She is due to see them back again 06/25/15. Pt wants to know if Dr. Maple HudsonYoung would refill her xanax to get her to that appt. Please advise Dr. Maple HudsonYoung thanks

## 2015-06-16 NOTE — Telephone Encounter (Signed)
Sorry, I can't refill that

## 2015-07-11 ENCOUNTER — Ambulatory Visit: Payer: Medicaid Other | Admitting: Internal Medicine

## 2015-07-23 ENCOUNTER — Other Ambulatory Visit: Payer: Self-pay | Admitting: Nurse Practitioner

## 2015-07-23 DIAGNOSIS — K7469 Other cirrhosis of liver: Secondary | ICD-10-CM

## 2015-08-01 ENCOUNTER — Other Ambulatory Visit: Payer: Medicaid Other

## 2015-08-07 ENCOUNTER — Ambulatory Visit
Admission: RE | Admit: 2015-08-07 | Discharge: 2015-08-07 | Disposition: A | Discharge status: Home / Self Care | Payer: Medicaid Other | Source: Ambulatory Visit | Attending: Nurse Practitioner | Admitting: Nurse Practitioner

## 2015-08-07 DIAGNOSIS — K7469 Other cirrhosis of liver: Secondary | ICD-10-CM

## 2015-08-12 ENCOUNTER — Other Ambulatory Visit: Payer: Medicaid Other

## 2015-08-13 ENCOUNTER — Telehealth: Payer: Self-pay | Admitting: Lab

## 2015-08-13 ENCOUNTER — Other Ambulatory Visit: Payer: Medicaid Other

## 2015-08-13 DIAGNOSIS — B182 Chronic viral hepatitis C: Secondary | ICD-10-CM

## 2015-08-13 LAB — IRON: Iron: 82 ug/dL (ref 45–160)

## 2015-08-13 LAB — COMPREHENSIVE METABOLIC PANEL
ALT: 53 U/L — ABNORMAL HIGH (ref 6–29)
AST: 38 U/L — ABNORMAL HIGH (ref 10–35)
Albumin: 4.5 g/dL (ref 3.6–5.1)
Alkaline Phosphatase: 68 U/L (ref 33–130)
BUN: 10 mg/dL (ref 7–25)
CO2: 24 mmol/L (ref 20–31)
Calcium: 9.8 mg/dL (ref 8.6–10.4)
Chloride: 101 mmol/L (ref 98–110)
Creat: 0.81 mg/dL (ref 0.50–1.05)
Glucose, Bld: 99 mg/dL (ref 65–99)
Potassium: 4.5 mmol/L (ref 3.5–5.3)
Sodium: 137 mmol/L (ref 135–146)
Total Bilirubin: 0.7 mg/dL (ref 0.2–1.2)
Total Protein: 7.3 g/dL (ref 6.1–8.1)

## 2015-08-13 NOTE — Telephone Encounter (Signed)
Pt came in today to have required labs drawn.  She was then given an appmt with Dr. Luciana Axeomer for 08/20/2015 @ [facility] for PCP contact [place] NoeVanessa

## 2015-08-14 LAB — ANA: Anti Nuclear Antibody(ANA): NEGATIVE

## 2015-08-14 LAB — HEPATITIS B CORE ANTIBODY, TOTAL: Hep B Core Total Ab: NONREACTIVE

## 2015-08-14 LAB — HIV ANTIBODY (ROUTINE TESTING W REFLEX): HIV 1&2 Ab, 4th Generation: NONREACTIVE

## 2015-08-14 LAB — HEPATITIS A ANTIBODY, TOTAL: Hep A Total Ab: REACTIVE — AB

## 2015-08-17 LAB — HCV RNA,LIPA RFLX NS5A DRUG RESIST

## 2015-08-17 LAB — HCV RNA, QUANT REAL-TIME PCR W/REFLEX
HCV RNA, PCR, QN (Log): 6.66 LogIU/mL — ABNORMAL HIGH
HCV RNA, PCR, QN: 4530000 IU/mL — ABNORMAL HIGH

## 2015-08-20 ENCOUNTER — Encounter: Payer: Self-pay | Admitting: Internal Medicine

## 2015-08-20 ENCOUNTER — Ambulatory Visit (INDEPENDENT_AMBULATORY_CARE_PROVIDER_SITE_OTHER): Payer: Medicaid Other | Admitting: Internal Medicine

## 2015-08-20 VITALS — BP 150/79 | HR 97 | Temp 98.2°F | Ht 61.0 in | Wt 108.0 lb

## 2015-08-20 DIAGNOSIS — K7400 Hepatic fibrosis, unspecified: Secondary | ICD-10-CM | POA: Insufficient documentation

## 2015-08-20 DIAGNOSIS — B182 Chronic viral hepatitis C: Secondary | ICD-10-CM

## 2015-08-20 NOTE — Progress Notes (Signed)
[facility]   CC: consideration for treatment for chronic hepatitis C  HPI:  +Megan Soto is a 55 y.o. female who presents for initial evaluation and management of chronic hepatitis C.  Patient tested positive over 1 year ago and was being evaluated by hepatology. Hepatitis C-associated risk factors present are: none. Patient denies intranasal drug use, IV drug abuse, renal dialysis, sexual contact with person with liver disease, tattoos. Patient has had other studies performed. Results: hepatitis C RNA by PCR, result: positive. Patient has not had prior treatment for Hepatitis C. Patient does not have a past history of liver disease. Patient does not have a family history of liver disease. Patient does not  have associated signs or symptoms related to liver disease.  Labs reviewed and confirm chronic hepatitis C with a positive viral load.    Records reviewed from hepatology, [facility].  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    She has a long history of smoking and is followed by Dr. Maple Hudson of pulmonology andhas continued to smoke, despite repeated counseling.  She also has continued to drink and was told by [facility] that per medicaid guidelines, she needs to not drink, be compliant with appointments, and for her own health needs to stop smoking.  She is interested in treatment and tells me she stopped drinking 2 weeks ago.  She continues to struggle with smoking but reports being down to 1/2 ppd.  She lives with an alcoholic but does not feel she needs assistance to stop drinking.  She endorses increased stressors at home as her reason for smoking. Of note, she arrived 30 minutes late for the appointment and asked for a refill of her xanax and opiod.      Patient does have documented immunity to Hepatitis A. Patient does have documented  immunity to Hepatitis B.    Review of Systems:   Constitutional: negative for fevers and chills Musculoskeletal: positive for myalgias and arthralgias, negative for muscle weakness All other systems reviewed and are negative      Past Medical History  Diagnosis Date  . GERD (gastroesophageal reflux disease)   . Allergic rhinitis, seasonal   . Sinusitis   . Asthma   . COPD (chronic obstructive pulmonary disease) (HCC)   . Anxiety   . Fibromyalgia   . SVD (spontaneous vaginal delivery)     x 1  . Shortness of breath   . Hypertension   . Diabetes mellitus without complication (HCC)   . Depression     no meds  . Arthritis     hands, knees,neck, shoulders  . Hepatitis C 2011    Prior to Admission medications   Medication Sig Start Date End Date Taking? Authorizing Provider  albuterol (PROAIR HFA) 108 (90 BASE) MCG/ACT inhaler 2 puffs every 4-6 hours if needed- rescue 01/09/15  Yes Waymon Budge, MD  albuterol (PROVENTIL) (2.5 MG/3ML) 0.083% nebulizer solution Take 3 mLs (2.5 mg total) by nebulization every 6 (six) hours as needed. DX 492.8 01/09/15  Yes Waymon Budge, MD  Ipratropium-Albuterol (COMBIVENT RESPIMAT) 20-100 MCG/ACT AERS respimat 1 puff every 6 hours if needed 01/09/15  Yes Waymon Budge, MD  lisinopril (PRINIVIL,ZESTRIL) 40 MG tablet Take 40 mg by mouth daily.   Yes Historical Provider, MD  metFORMIN (GLUCOPHAGE) 500 MG tablet  Take 500 mg by mouth 2 (two) times daily with a meal.    Yes Historical Provider, MD  mometasone-formoterol (DULERA) 100-5 MCG/ACT AERO 2 puffs and rinse mouth, twice daily- maintenance 01/09/15  Yes Waymon Budgelinton D Young, MD  Multiple Vitamin (MULTIVITAMIN WITH MINERALS) TABS Take 1 tablet by mouth daily.   Yes Historical Provider, MD  omeprazole (PRILOSEC) 20 MG capsule Take 1 capsule (20 mg total) by mouth daily. 06/10/15  Yes Waymon Budgelinton D Young, MD  tiotropium (SPIRIVA HANDIHALER) 18 MCG inhalation capsule Inhale 1 daily as directed 01/09/15  Yes Waymon Budgelinton  D Young, MD  alprazolam Prudy Feeler(XANAX) 2 MG tablet Take 2 tablets (4 mg total) by mouth at bedtime. Patient not taking: Reported on 08/20/2015 06/10/15   Waymon Budgelinton D Young, MD  HYDROcodone-acetaminophen [facility](NORCO) 10-325 MG per tablet Take 1 tablet by mouth every 6 (six) hours as needed. Reported on 08/20/2015    Historical Provider, MD    Allergies  Allergen Reactions  . Aspirin Nausea And Vomiting    Social History  Substance Use Topics  . Smoking status: Current Every Day Smoker -- 1.00 packs/day for 40 years    Types: Cigarettes  . Smokeless tobacco: Never Used     Comment: started smoking at age 55  . Alcohol Use: No    Family History  Problem Relation Age of Onset  . COPD Mother   no liver cancer, no cirrhosis   Objective:  Constitutional: in no apparent distress and alert,  Filed Vitals:   08/20/15 1505  BP: 150/79  Pulse: 97  Temp: 98.2 F (36.8 C)   Eyes: anicteric Cardiovascular: Cor RRR and No murmurs Respiratory: CTA B; normal respiratory effort Gastrointestinal: Bowel sounds are normal, liver is not enlarged, spleen is not enlarged Musculoskeletal: peripheral pulses normal, no pedal edema, no clubbing or cyanosis Skin: negative for - jaundice, spider hemangioma, telangiectasia, palmar erythema, ecchymosis and atrophy; no porphyria cutanea tarda Lymphatic: no cervical lymphadenopathy   Laboratory Genotype: No results found for: HCVGENOTYPE HCV viral load: No results found for: HCVQUANT Lab Results  Component Value Date   WBC 5.4 08/30/2014   HGB 13.8 08/30/2014   HCT 41.8 08/30/2014   MCV 94.8 08/30/2014   PLT 216 08/30/2014    Lab Results  Component Value Date   CREATININE 0.81 08/13/2015   BUN 10 08/13/2015   NA 137 08/13/2015   K 4.5 08/13/2015   CL 101 08/13/2015   CO2 24 08/13/2015    Lab Results  Component Value Date   ALT 53* 08/13/2015   AST 38* 08/13/2015   ALKPHOS 68 08/13/2015     Labs and history reviewed and show CHILD-PUGH A  5-6  points: Child class A 7-9 points: Child class B 10-15 points: Child class C  Lab Results  Component Value Date   INR 0.95 08/04/2012   BILITOT 0.7 08/13/2015   ALBUMIN 4.5 08/13/2015     Assessment: New Patient with Chronic Hepatitis C genotype 1b, untreated.  I discussed with the patient the lab findings that confirm chronic hepatitis C as well as the natural history and progression of disease including about 30% of people who develop cirrhosis of the liver if left untreated and once cirrhosis is established there is a 2-7% risk per year of liver cancer and liver failure.  I discussed the importance of treatment and benefits in reducing the risk, even if significant liver fibrosis exists.    I also discussed with her at length that per medicaid rules, she needs  to not drink for 6 months and be in a substance abuse program.  I also discussed that her main issues is her lungs and smoking and as long as she continues to smoke, her life expectancy will be affected by her smoking and treatment of hepatitis C is very unlikely to change that.      Plan: At this time, I will have her scheduled with our substance abuse counselor to establish if she is indeed serious about stopping alcohol, if she can show compliance with appointments, and that she also needs speak to Dr. Maple Hudson about smoking cessation.  I will have her follow up in 4 months to monitor her progress but I reiterated that I would not consider treatment without a six month period with the counseling.    If she complies, we can then consider treatment or she can consider it at [facility].    Thanks for referral

## 2015-08-22 ENCOUNTER — Telehealth: Payer: Self-pay | Admitting: Internal Medicine

## 2015-08-22 MED ORDER — TRAMADOL HCL 50 MG PO TABS: 50.0000 mg | ORAL_TABLET | Freq: Four times a day (QID) | ORAL | Status: DC | PRN

## 2015-08-22 NOTE — Telephone Encounter (Signed)
Suggest tramadol for cough- 50 mg, # 30, 1 every 6 hours if needed, no ref

## 2015-08-22 NOTE — Telephone Encounter (Signed)
Pt is aware of CY's recommendation. She was not happy about this. Pt was quite rude on the phone. Rx has been called in. Nothing further was needed.

## 2015-08-22 NOTE — Telephone Encounter (Signed)
Spoke with pt, requesting a sleep aid and codeine cough syrup.  Pt c/o sore throat, HA, facial tenderness, PND, prod cough with white/yellow mucus, chest tightness X1 week.  S/s worse qhs, keeping pt awake.  Denies fever, chills, chest pain. Pt uses [facility].    CY please advise on recs.  Thanks!   Allergies  Allergen Reactions  . Aspirin Nausea And Vomiting   Current Outpatient Prescriptions on File Prior to Visit  Medication Sig Dispense Refill  . albuterol (PROAIR HFA) 108 (90 BASE) MCG/ACT inhaler 2 puffs every 4-6 hours if needed- rescue 17 g 11  . albuterol (PROVENTIL) (2.5 MG/3ML) 0.083% nebulizer solution Take 3 mLs (2.5 mg total) by nebulization every 6 (six) hours as needed. DX 492.8 360 mL 3  . alprazolam (XANAX) 2 MG tablet Take 2 tablets (4 mg total) by mouth at bedtime. (Patient not taking: Reported on 08/20/2015) 6 tablet 0  . HYDROcodone-acetaminophen (NORCO) 10-325 MG per tablet Take 1 tablet by mouth every 6 (six) hours as needed. Reported on 08/20/2015    . Ipratropium-Albuterol (COMBIVENT RESPIMAT) 20-100 MCG/ACT AERS respimat 1 puff every 6 hours if needed 4 g 11  . lisinopril (PRINIVIL,ZESTRIL) 40 MG tablet Take 40 mg by mouth daily.    . metFORMIN (GLUCOPHAGE) 500 MG tablet Take 500 mg by mouth 2 (two) times daily with a meal.     . mometasone-formoterol (DULERA) 100-5 MCG/ACT AERO 2 puffs and rinse mouth, twice daily- maintenance 13 g 11  . Multiple Vitamin (MULTIVITAMIN WITH MINERALS) TABS Take 1 tablet by mouth daily.    Marland Kitchen. omeprazole (PRILOSEC) 20 MG capsule Take 1 capsule (20 mg total) by mouth daily. 30 capsule 2  . tiotropium (SPIRIVA HANDIHALER) 18 MCG inhalation capsule Inhale 1 daily as directed 30 capsule 11   No current facility-administered medications on file prior to visit.

## 2015-09-15 ENCOUNTER — Ambulatory Visit: Payer: Medicaid Other | Admitting: *Deleted

## 2015-09-22 ENCOUNTER — Ambulatory Visit: Payer: Medicaid Other | Admitting: *Deleted

## 2015-10-07 ENCOUNTER — Other Ambulatory Visit: Payer: Self-pay | Admitting: Internal Medicine

## 2015-10-08 ENCOUNTER — Ambulatory Visit: Payer: Medicaid Other | Admitting: Internal Medicine

## 2015-10-08 NOTE — Telephone Encounter (Signed)
Okay to refill both.

## 2015-10-08 NOTE — Telephone Encounter (Signed)
CY please advise on Tramadol rx. Thanks.

## 2015-10-13 ENCOUNTER — Ambulatory Visit: Payer: Medicaid Other | Admitting: *Deleted

## 2015-10-23 ENCOUNTER — Other Ambulatory Visit: Payer: Self-pay | Admitting: Internal Medicine

## 2015-10-23 MED ORDER — FLUTICASONE PROPIONATE 50 MCG/ACT NA SUSP: 2.0000 | Freq: Every day | NASAL | Status: DC

## 2015-10-23 MED ORDER — CETIRIZINE HCL 10 MG PO TABS: 10.0000 mg | ORAL_TABLET | Freq: Every day | ORAL | Status: AC

## 2015-10-23 NOTE — Telephone Encounter (Signed)
Per CY-okay to refill for 1 year.

## 2015-11-03 ENCOUNTER — Ambulatory Visit (INDEPENDENT_AMBULATORY_CARE_PROVIDER_SITE_OTHER): Payer: Medicaid Other | Admitting: Internal Medicine

## 2015-11-03 ENCOUNTER — Ambulatory Visit (INDEPENDENT_AMBULATORY_CARE_PROVIDER_SITE_OTHER)
Admission: RE | Admit: 2015-11-03 | Discharge: 2015-11-03 | Disposition: A | Discharge status: Home / Self Care | Payer: Medicaid Other | Source: Ambulatory Visit | Attending: Internal Medicine | Admitting: Internal Medicine

## 2015-11-03 ENCOUNTER — Encounter: Payer: Self-pay | Admitting: Internal Medicine

## 2015-11-03 VITALS — BP 122/66 | HR 92 | Ht 62.0 in | Wt 111.4 lb

## 2015-11-03 DIAGNOSIS — J42 Unspecified chronic bronchitis: Secondary | ICD-10-CM | POA: Diagnosis not present

## 2015-11-03 DIAGNOSIS — Z720 Tobacco use: Secondary | ICD-10-CM | POA: Diagnosis not present

## 2015-11-03 DIAGNOSIS — F411 Generalized anxiety disorder: Secondary | ICD-10-CM | POA: Diagnosis not present

## 2015-11-03 DIAGNOSIS — J449 Chronic obstructive pulmonary disease, unspecified: Secondary | ICD-10-CM

## 2015-11-03 MED ORDER — ALPRAZOLAM 1 MG PO TABS: 1.0000 mg | ORAL_TABLET | Freq: Three times a day (TID) | ORAL | Status: DC | PRN

## 2015-11-03 NOTE — Assessment & Plan Note (Signed)
Emphasized critical importance of complete smoking cessation. Discussed available support. Plan-agreed to refill Xanax

## 2015-11-03 NOTE — Patient Instructions (Addendum)
Order- CXR   Dx chronic bronchitis  Please try harder to stop smoking- You can do it for you !  Script printed for xanax refill- use sparingly

## 2015-11-03 NOTE — Assessment & Plan Note (Signed)
Discussed options. She needs long term counseling support Xanax was discussed very carefully. I am reducing pill strength from 2 mg to 1 mg Plan refill Xanax

## 2015-11-03 NOTE — Progress Notes (Signed)
Patient ID: Megan Soto, female    DOB: [birth year], 55 y.o.   MRN: [id number]  HPI 01/26/11- 55 year old female smoker followed for COPD complicated by anxiety, DM, GERD, musculoskeletal pain, hepatitis C. Last here 12/19, 2011- note reviewed.  Breathing has done fairly well with supplemental O2 2 L/M . Heat increases her cough and especially bad at night. Out of Combivent till tomorrow. Discussed overlap of these meds. Blames nerves for diffuse somatic pains with a lot of arthritis. Tussive headches. Scant sticky mucus. Uses neb 4x daily. Also on Spiriva- dry mouth.  Up frequently at night, implies diazepam doesn't keep her asleep at night.  Still smoking. Asks for pain meds/ cough syrup- says Dr Jeannetta NapElkins> gave cough syrup 1 month ago. Still gets pain pills from Dr Thurmond ButtsKpeglo. We have provided her diazepam for sleep and anxiety, which helped to reduce her smoking.  10/07/11- 55 year old female smoker followed for COPD complicated by anxiety, DM, GERD, musculoskeletal pain, hepatitis C She denies acute problems. Reports that 3 weeks ago Dr. Jeannetta NapElkins did chest x-ray when she was coughing some blood in the sputum. Treated for infection. Sputum has cleared and she feels back to baseline. Continues oxygen 2 L/Advanced used for sleep and exertion. Using albuterol or Combivent rescue inhaler several times a day, nebulizer 2 or 3 times a day plus Spiriva. Reluctant to use Advair because of TV advertisements describing "infection". We discussed over- use of stimulant bronchodilators.  12/24/11- 55 year old female smoker followed for COPD complicated by anxiety, DM, GERD, musculoskeletal pain, hepatitis C Patient c/o sore throat, sob with exertion, wheeizng, and chest tightness.  Trying to keep her smoking down to 1 per hour. Cough rasps and keeps throat sore. occasional blood streak still, intermittent, not progressive. Denies nodes, chest pain. Biggest problem remains her chronic anxiety. She mentioned some  scattered somatic pains but I did not invite request for pain med. Medicaid did not want to do CT without more indication. CXR 11/27/11 IMPRESSION:  COPD. No active disease.  Original Report Authenticated By: Danae OrleansJOHN A. STAHL, M.D.   01/10/13- 55 year old female smoker followed for COPD complicated by anxiety, DM, GERD, musculoskeletal pain, hepatitis C O2 2 L/Advanced Depressed by family stress. Vomiting last night. Asks I give her an antidepressant. Taking Xanax to help sleep. Pain medicine clinic/ Dr Thurmond ButtsKpeglo. Nebulizer does help. Continues to smoke one pack per day. Complains of low back pain. CXR 02/22/12 IMPRESSION:  COPD. No active disease.  Original Report Authenticated By: Cyndie ChimeKEVIN G. DOVER, M.D.  07/17/13-55 year old female smoker followed for COPD complicated by anxiety, DM, GERD, musculoskeletal pain, hepatitis C O2 2 L/Advanced FOLLOWS FOR: breathing is "off and on"; depends on what she is doing. Is having more cramps in lower chest area. Using O2 at times when needed. would like Rx for Prilosec sent to pharmacy. His with wood stove which bothers her breathing. Dyspnea on exertion walking the store but does not have transportation. Discussed flu and pneumonia vaccinations. CXR 01/10/13 IMPRESSION:  1. No acute cardiopulmonary abnormalities.  Original Report Authenticated By: Signa Kellaylor Stroud, M.D  6 MWT 04/25/13-- 95%, 96%, 96% 384 meters- quit w/ 20 seconds left, saying legs like jello, unable to walk farther. No oxygen limitation  03/15/2014  Acute/ emergent extended ov/Wert re: cough and hurt/ still smoking / Dr Maple HudsonYoung pt/ on acei and spiriva  Chief Complaint  Patient presents with  . Acute Visit    Pt states "I have no idea why I am here". She states "breathing is just  getting real bad". Could not say how long this has been bothering her.    cough chronic but worse  x 4 weeks traces of brp, gen ant cp s localization p coughing fits No sob unless coughing Cough worse day than night, mostly  dry /harsh/ barking quality  Really shaky on names of meds and how to use them CXR  03/15/2014 :  No active cardiopulmonary disease.  01/08/15- 55 year old female smoker followed for COPD complicated by anxiety, DM, GERD, musculoskeletal pain, hepatitis C ER visit 08/30/14- abdominal and back pain Report: has not had pain pills in about a month, wheezing, SOB, wears o2 sometimes during anxiety fits,coughing white, non-bloody sputum. Smoking at least 1/2 ppd "I'm so stressed". Hinted desire for pain meds- deferred to her pain clinic. Today c/o woke in AM with pleuritic, tussive sternal angle pain, tender to touch. "Eating tons of aspirin" Using only combivent inhaler- out of others. Needed form filled out for bus transportation- done and faxed.   06/10/15- 55 year old female smoker followed for COPD complicated by anxiety, DM, GERD, musculoskeletal pain, hepatitis C ACUTE VISIT: for about 1 week having increased chest congestion with tightness, cough, SOB and wheezing. O2 2 L/Advanced Patient reports acute exacerbation in the past week with nonproductive cough, wheeze, tussive bilateral rib soreness. Uses oxygen at home but came without it. Has nebulizer machine at home but hasn't used today. She was seeking refills of Xanax and Soma saying her previous pain doctor has gone out of practice and she is starting with a new pain clinic tomorrow. CXR 08/30/14 IMPRESSION: No active cardiopulmonary disease. Electronically Signed  By: Ruel Favorsrevor Shick M.D.  On: 08/30/2014 15:28  11/03/2015-55 year old female smoker followed for COPD, complicated by anxiety, DM, GERD, musculoskeletal pain, hepatitis C O2 2 L/Advanced FOLLOWS FOR: [facility] but continues to have SOB with activity. Has cut down on smoking but needs additional help. Her main problem is stress related to husband's drinking problem and adult daughter who was moved back and with her because of marriage problems. She says she feels  too stressed to sleep so she sits up at night and smokes. Denies change in mild dyspnea with exertion, chronic cough remains nonproductive. [facility] but they told her she had to get Xanax somewhere else.   ROS-see HPI   Negative unless "+" Constitutional:    weight loss, night sweats, fevers, chills, fatigue, lassitude. HEENT:    headaches, difficulty swallowing, tooth/dental problems, sore throat,       sneezing, itching, ear ache, nasal congestion, post nasal drip, snoring CV:    chest pain, orthopnea, PND, swelling in lower extremities, anasarca,                                               dizziness, palpitations Resp:  + shortness of breath with exertion or at rest.                productive cough,   + non-productive cough, coughing up of blood.              change in color of mucus.  + wheezing.   Skin:    rash or lesions. GI:  No-   heartburn, indigestion, abdominal pain, nausea, vomiting, GU: dysuria, change in color of urine, no urgency or frequency.   flank pain.  MS:  + joint pain, stiffness, decreased range of motion, back pain. Neuro-     nothing unusual Psych:  change in mood or affect.  depression or anxiety.   memory loss.  OBJ- Physical Exam General- Alert, Oriented, Affect-pleasant/ mildly anxious, Distress- none acute Skin- rash-none, lesions- none, excoriation- none Lymphadenopathy- none Head- atraumatic            Eyes- Gross vision intact, PERRLA, conjunctivae and secretions clear            Ears- Hearing, canals-normal            Nose- Clear, no-Septal dev, mucus, polyps, erosion, perforation             Throat- Mallampati II , mucosa clear , drainage- none, tonsils- atrophic Neck- flexible , trachea midline, no stridor , thyroid nl, carotid no bruit Chest - symmetrical excursion , unlabored           Heart/CV- RRR , no murmur , no gallop  , no rub, nl s1 s2                           - JVD- none , edema- none, stasis changes-  none, varices- none           Lung- + light coughing, wheeze-none,  , dullness-none, rub- none9           Chest wall- breast without dominant mass -has not been getting mammograms Abd-  Br/ Gen/ Rectal- Not done, not indicated Extrem- cyanosis- none, clubbing, none, atrophy- none, strength- nl Neuro- grossly intact to observation

## 2015-11-03 NOTE — Assessment & Plan Note (Signed)
Clinically stable or better than on some visits. Plan-smoke cessation emphasized, call for med refills when needed, chest x-ray

## 2015-11-07 ENCOUNTER — Telehealth: Payer: Self-pay | Admitting: Internal Medicine

## 2015-11-07 NOTE — Telephone Encounter (Signed)
Per 11/03/15 OV: Patient Instructions     Order- CXR Dx chronic bronchitis Please try harder to stop smoking- You can do it for you ! Script printed for xanax refill- use sparingly  ---  Called spoke with pt. She reports she is having leg cramps and is wanting soma. I advised pt she needs to call her PCP for this. nothing further needed

## 2015-11-08 ENCOUNTER — Telehealth: Payer: Self-pay | Admitting: Internal Medicine

## 2015-11-08 NOTE — Telephone Encounter (Signed)
Called requesting flexoril/ already turned down by Dr Maple HudsonYoung 11/07/15 for muscle relaxers

## 2015-11-10 ENCOUNTER — Ambulatory Visit: Payer: Medicaid Other | Admitting: *Deleted

## 2015-11-10 NOTE — Telephone Encounter (Signed)
Per previous telephone notes, the refill was deferred to PCP. Pt is stating that she was told by CY that he would not prescribe Soma but could prescribe her Flexeril.  Pt states that she has not called her PCP because of what she states was discussed last OV. Please advise Dr Maple HudsonYoung. Thanks.   Tommie SamsMindy S Silva, CMA at 11/07/2015 4:40 PM     Status: Signed       Expand All Collapse All   Per 11/03/15 OV: Patient Instructions     Order- CXR Dx chronic bronchitis Please try harder to stop smoking- You can do it for you ! Script printed for xanax refill- use sparingly  --- Called spoke with pt. She reports she is having leg cramps and is wanting soma. I advised pt she needs to call her PCP for this. nothing further needed

## 2015-11-10 NOTE — Telephone Encounter (Signed)
Patient notified of Dr. Young's recommendations. Nothing further needed.  

## 2015-11-10 NOTE — Telephone Encounter (Signed)
No, sorry. She needs to get this from PCP

## 2015-11-10 NOTE — Telephone Encounter (Signed)
Pt calling again for this fexoril please advise.Caren GriffinsStanley A Dalton

## 2015-12-08 ENCOUNTER — Other Ambulatory Visit: Payer: Self-pay | Admitting: Internal Medicine

## 2015-12-23 ENCOUNTER — Ambulatory Visit: Payer: Medicaid Other | Admitting: Internal Medicine

## 2016-01-08 ENCOUNTER — Other Ambulatory Visit: Payer: Self-pay | Admitting: Internal Medicine

## 2016-01-09 ENCOUNTER — Telehealth: Payer: Self-pay | Admitting: Internal Medicine

## 2016-01-09 MED ORDER — ALBUTEROL SULFATE HFA 108 (90 BASE) MCG/ACT IN AERS: INHALATION_SPRAY | RESPIRATORY_TRACT | Status: DC

## 2016-01-09 MED ORDER — IPRATROPIUM-ALBUTEROL 20-100 MCG/ACT IN AERS: INHALATION_SPRAY | RESPIRATORY_TRACT | Status: DC

## 2016-01-09 NOTE — Telephone Encounter (Signed)
Patient calling to get refills on ProAir and Combivent. Rx sent to pharmacy. Patient reminded to keep appointment on 03/05/16 Nothing further needed.

## 2016-01-30 ENCOUNTER — Other Ambulatory Visit: Payer: Self-pay | Admitting: Internal Medicine

## 2016-02-05 ENCOUNTER — Other Ambulatory Visit: Payer: Self-pay | Admitting: *Deleted

## 2016-02-05 MED ORDER — ALBUTEROL SULFATE HFA 108 (90 BASE) MCG/ACT IN AERS: INHALATION_SPRAY | RESPIRATORY_TRACT | Status: DC

## 2016-02-05 MED ORDER — IPRATROPIUM-ALBUTEROL 20-100 MCG/ACT IN AERS: INHALATION_SPRAY | RESPIRATORY_TRACT | Status: DC

## 2016-02-05 NOTE — Telephone Encounter (Signed)
Received fax request for refill on Combvent. Dr. Maple HudsonYoung signed for 12 refills, faxed back to pharmacy. Nothing further needed.

## 2016-02-05 NOTE — Telephone Encounter (Signed)
Received fax from pharmacy requesting refill of ProAir HFA Dr. Maple HudsonYoung signed form for 12 refills. Nothing further needed.

## 2016-02-20 ENCOUNTER — Telehealth: Payer: Self-pay | Admitting: Internal Medicine

## 2016-02-20 MED ORDER — ALPRAZOLAM 1 MG PO TABS: 1.0000 mg | ORAL_TABLET | Freq: Three times a day (TID) | ORAL | Status: DC | PRN

## 2016-02-20 NOTE — Telephone Encounter (Signed)
Ok to refill 

## 2016-02-20 NOTE — Telephone Encounter (Signed)
Spoke with pt. Rx has been called in. Nothing further was needed. 

## 2016-02-20 NOTE — Telephone Encounter (Signed)
Please advise if okay to refill alprazolam  Last rx given 11/03/15 1 mg 1 tab tidprn # 90 with 3 rf  Last ov 11/03/15 Next ov 03/03/16   Current Outpatient Prescriptions on File Prior to Visit  Medication Sig Dispense Refill  . albuterol (PROAIR HFA) 108 (90 Base) MCG/ACT inhaler 2 puffs every 4-6 hours if needed- rescue 18 g 12  . albuterol (PROVENTIL) (2.5 MG/3ML) 0.083% nebulizer solution Take 3 mLs (2.5 mg total) by nebulization every 6 (six) hours as needed. DX 492.8 360 mL 3  . ALPRAZolam (XANAX) 1 MG tablet Take 1 tablet (1 mg total) by mouth 3 (three) times daily as needed for anxiety. 90 tablet 3  . cetirizine (ZYRTEC) 10 MG tablet Take 1 tablet (10 mg total) by mouth daily. 10 tablet 12  . fluticasone (FLONASE) 50 MCG/ACT nasal spray Place 2 sprays into both nostrils daily. 16 g 11  . Ipratropium-Albuterol (COMBIVENT RESPIMAT) 20-100 MCG/ACT AERS respimat 1 puff every 6 hours if needed 4 g 12  . lisinopril (PRINIVIL,ZESTRIL) 40 MG tablet Take 40 mg by mouth daily.    . metFORMIN (GLUCOPHAGE) 500 MG tablet Take 500 mg by mouth 2 (two) times daily with a meal.     . mometasone-formoterol (DULERA) 100-5 MCG/ACT AERO 2 puffs and rinse mouth, twice daily- maintenance 13 g 11  . Multiple Vitamin (MULTIVITAMIN WITH MINERALS) TABS Take 1 tablet by mouth daily.    Marland Kitchen. omeprazole (PRILOSEC) 20 MG capsule TAKE ONE CAPSULE BY MOUTH DAILY 30 capsule 1  . oxycodone (OXY-IR) 5 MG capsule Take 5 mg by mouth every 4 (four) hours as needed.    Marland Kitchen. SPIRIVA HANDIHALER 18 MCG inhalation capsule PLACE 1 CAPSULE INTO INHALER AND INHALE ONCE DAILY AS DIRECTED 30 capsule 2   No current facility-administered medications on file prior to visit.

## 2016-03-02 ENCOUNTER — Other Ambulatory Visit: Payer: Self-pay | Admitting: Family Medicine

## 2016-03-02 DIAGNOSIS — E041 Nontoxic single thyroid nodule: Secondary | ICD-10-CM

## 2016-03-03 ENCOUNTER — Ambulatory Visit: Payer: Medicaid Other | Admitting: Internal Medicine

## 2016-03-05 ENCOUNTER — Ambulatory Visit: Payer: Medicaid Other | Admitting: Internal Medicine

## 2016-03-08 ENCOUNTER — Ambulatory Visit
Admission: RE | Admit: 2016-03-08 | Discharge: 2016-03-08 | Disposition: A | Discharge status: Home / Self Care | Payer: Medicaid Other | Source: Ambulatory Visit | Attending: Family Medicine | Admitting: Family Medicine

## 2016-03-08 DIAGNOSIS — E041 Nontoxic single thyroid nodule: Secondary | ICD-10-CM

## 2016-03-09 ENCOUNTER — Other Ambulatory Visit: Payer: Self-pay | Admitting: Internal Medicine

## 2016-03-18 ENCOUNTER — Telehealth: Payer: Self-pay | Admitting: Pain Medicine

## 2016-03-18 ENCOUNTER — Ambulatory Visit: Payer: Medicaid Other | Admitting: Internal Medicine

## 2016-03-18 NOTE — Telephone Encounter (Signed)
Would like to see Dr. Laban EmperorNaveira if he approves referral

## 2016-03-22 ENCOUNTER — Ambulatory Visit (INDEPENDENT_AMBULATORY_CARE_PROVIDER_SITE_OTHER): Payer: Medicaid Other | Admitting: Internal Medicine

## 2016-03-22 ENCOUNTER — Encounter: Payer: Self-pay | Admitting: Internal Medicine

## 2016-03-22 VITALS — BP 124/68 | HR 109 | Ht 62.0 in | Wt 114.2 lb

## 2016-03-22 DIAGNOSIS — Z720 Tobacco use: Secondary | ICD-10-CM

## 2016-03-22 DIAGNOSIS — J449 Chronic obstructive pulmonary disease, unspecified: Secondary | ICD-10-CM

## 2016-03-22 DIAGNOSIS — I10 Essential (primary) hypertension: Secondary | ICD-10-CM

## 2016-03-22 DIAGNOSIS — J45901 Unspecified asthma with (acute) exacerbation: Secondary | ICD-10-CM

## 2016-03-22 DIAGNOSIS — F411 Generalized anxiety disorder: Secondary | ICD-10-CM

## 2016-03-22 MED ORDER — METHYLPREDNISOLONE ACETATE 80 MG/ML IJ SUSP
80.0000 mg | Freq: Once | INTRAMUSCULAR | Status: AC
  Administered 2016-03-22: 80 mg via INTRAMUSCULAR

## 2016-03-22 NOTE — Progress Notes (Signed)
Patient ID: Megan Soto, female    DOB: [birth year], 55 y.o.   MRN: [id number]  HPI 01/26/11- 55 year old female smoker followed for COPD complicated by anxiety, DM, GERD, musculoskeletal pain, hepatitis C. Last here 12/19, 2011- note reviewed.  Breathing has done fairly well with supplemental O2 2 L/M . Heat increases her cough and especially bad at night. Out of Combivent till tomorrow. Discussed overlap of these meds. Blames nerves for diffuse somatic pains with a lot of arthritis. Tussive headches. Scant sticky mucus. Uses neb 4x daily. Also on Spiriva- dry mouth.  Up frequently at night, implies diazepam doesn't keep her asleep at night.  Still smoking. Asks for pain meds/ cough syrup- says Megan Megan Soto> gave cough syrup 1 month ago. Still gets pain pills from Megan Soto. We have provided her diazepam for sleep and anxiety, which helped to reduce her smoking.  10/07/11- 55 year old female smoker followed for COPD complicated by anxiety, DM, GERD, musculoskeletal pain, hepatitis C She denies acute problems. Reports that 3 weeks ago Megan. Jeannetta Soto did chest x-ray when she was coughing some blood in the sputum. Treated for infection. Sputum has cleared and she feels back to baseline. Continues oxygen 2 L/Advanced used for sleep and exertion. Using albuterol or Combivent rescue inhaler several times a day, nebulizer 2 or 3 times a day plus Spiriva. Reluctant to use Advair because of TV advertisements describing "infection". We discussed over- use of stimulant bronchodilators.  12/24/11- 55 year old female smoker followed for COPD complicated by anxiety, DM, GERD, musculoskeletal pain, hepatitis C Patient c/o sore throat, sob with exertion, wheeizng, and chest tightness.  Trying to keep her smoking down to 1 per hour. Cough rasps and keeps throat sore. occasional blood streak still, intermittent, not progressive. Denies nodes, chest pain. Biggest problem remains her chronic anxiety. She mentioned some  scattered somatic pains but I did not invite request for pain med. Medicaid did not want to do CT without more indication. CXR 11/27/11 IMPRESSION:  COPD. No active disease.  Original Report Authenticated By: Megan Soto, M.D.   01/10/13- 55 year old female smoker followed for COPD complicated by anxiety, DM, GERD, musculoskeletal pain, hepatitis C O2 2 L/Advanced Depressed by family stress. Vomiting last night. Asks I give her an antidepressant. Taking Xanax to help sleep. Pain medicine clinic/ Megan Soto. Nebulizer does help. Continues to smoke one pack per day. Complains of low back pain. CXR 02/22/12 IMPRESSION:  COPD. No active disease.  Original Report Authenticated By: Megan Soto, M.D.  07/17/13-55 year old female smoker followed for COPD complicated by anxiety, DM, GERD, musculoskeletal pain, hepatitis C O2 2 L/Advanced FOLLOWS FOR: breathing is "off and on"; depends on what she is doing. Is having more cramps in lower chest area. Using O2 at times when needed. would like Rx for Prilosec sent to pharmacy. His with wood stove which bothers her breathing. Dyspnea on exertion walking the store but does not have transportation. Discussed flu and pneumonia vaccinations. CXR 01/10/13 IMPRESSION:  1. No acute cardiopulmonary abnormalities.  Original Report Authenticated By: Megan Soto, M.D  6 MWT 04/25/13-- 95%, 96%, 96% 384 meters- quit w/ 20 seconds left, saying legs like jello, unable to walk farther. No oxygen limitation  03/15/2014  Acute/ emergent extended ov/Wert re: cough and hurt/ still smoking / Megan Megan Soto pt/ on acei and spiriva  Chief Complaint  Patient presents with  . Acute Visit    Pt states "I have no idea why I am here". She states "breathing is just  getting real bad". Could not say how long this has been bothering her.    cough chronic but worse  x 4 weeks traces of brp, gen ant cp s localization p coughing fits No sob unless coughing Cough worse day than night, mostly  dry /harsh/ barking quality  Really shaky on names of meds and how to use them CXR  03/15/2014 :  No active cardiopulmonary disease.  01/08/15- 55 year old female smoker followed for COPD complicated by anxiety, DM, GERD, musculoskeletal pain, hepatitis C ER visit 08/30/14- abdominal and back pain Report: has not had pain pills in about a month, wheezing, SOB, wears o2 sometimes during anxiety fits,coughing white, non-bloody sputum. Smoking at least 1/2 ppd "I'm so stressed". Hinted desire for pain meds- deferred to her pain clinic. Today c/o woke in AM with pleuritic, tussive sternal angle pain, tender to touch. "Eating tons of aspirin" Using only combivent inhaler- out of others. Needed form filled out for bus transportation- done and faxed.   06/10/15- 54 year old female smoker followed for COPD complicated by anxiety, DM, GERD, musculoskeletal pain, hepatitis C ACUTE VISIT: for about 1 week having increased chest congestion with tightness, cough, SOB and wheezing. O2 2 L/Advanced Patient reports acute exacerbation in the past week with nonproductive cough, wheeze, tussive bilateral rib soreness. Uses oxygen at home but came without it. Has nebulizer machine at home but hasn't used today. She was seeking refills of Xanax and Soma saying her previous pain doctor has gone out of practice and she is starting with a new pain clinic tomorrow. CXR 08/30/14 IMPRESSION: No active cardiopulmonary disease. Electronically Signed  By: Megan Soto M.D.  On: 08/30/2014 15:28  11/03/2015-55 year old female smoker followed for COPD, complicated by anxiety, DM, GERD, musculoskeletal pain, hepatitis C O2 2 L/Advanced FOLLOWS FOR: Pt uses O2 through Megan Soto but continues to have SOB with activity. Has cut down on smoking but needs additional help. Her main problem is stress related to husband's drinking problem and adult daughter who was moved back and with her because of marriage problems. She says she feels  too stressed to sleep so she sits up at night and smokes. Denies change in mild dyspnea with exertion, chronic cough remains nonproductive. Goes to [facility] pain clinic for oxycodone but they told her she had to get Xanax somewhere else.   03/22/2016-55 year old female smoker followed for COPD, complicated by anxiety, DM, GERD, musculoskeletal pain/Pain Clinic, hepatitis C O2 2 L/Advanced FOLLOWS FOR: Pt states she continues to stay SOB and wheezing with cough-productive(green to brown in color) everyday. Pt states she is also in alot of pain and would like Rx for this. Pt would like to have sample of rescue inhaler. Continues Xanax for chronic anxiety with depression. Changing pain clinic and is asking me for pain medication for "hurt all over" until pain clinic appointment next month. I explained that is outside my scope of practice. Again says she is trying to stop smoking and has "cut down" but unable to get clear report of how much she smokes daily. Daily wheeze and cough with clear sputum. No blood, palpitation or fever. She talked about changing PCP.  ROS-see HPI   Negative unless "+" Constitutional:    weight loss, night sweats, fevers, chills, fatigue, lassitude. HEENT:    headaches, difficulty swallowing, tooth/dental problems, sore throat,       sneezing, itching, ear ache, nasal congestion, post nasal drip, snoring CV:    chest pain, orthopnea, PND, swelling in lower extremities, anasarca,  dizziness, palpitations Resp:  + shortness of breath with exertion or at rest.                productive cough,   + non-productive cough, coughing up of blood.              change in color of mucus.  + wheezing.   Skin:    rash or lesions. GI:  No-   heartburn, indigestion, abdominal pain, nausea, vomiting, GU: dysuria, change in color of urine, no urgency or frequency.   flank pain. MS:  + joint pain, stiffness, decreased range of motion, back  pain. Neuro-     nothing unusual Psych:  change in mood or affect.  +depression or anxiety.   memory loss.  OBJ- Physical Exam General- Alert, Oriented, Affect-pleasant/  anxious, Distress- none acute Skin- rash-none, lesions- none, excoriation- none Lymphadenopathy- none Head- atraumatic            Eyes- Gross vision intact, PERRLA, conjunctivae and secretions clear            Ears- Hearing, canals-normal            Nose- Clear, no-Septal dev, mucus, polyps, erosion, perforation             Throat- Mallampati II , mucosa clear , drainage- none, tonsils- atrophic Neck- flexible , trachea midline, no stridor , thyroid nl, carotid no bruit Chest - symmetrical excursion , unlabored           Heart/CV- RRR , no murmur , no gallop  , no rub, nl s1 s2                           - JVD- none , edema- none, stasis changes- none, varices- none           Lung- + raspy coughing, wheeze + with forced expiration,  , dullness-none, rub- none9           Chest wall- breasts without dominant mass -has not been getting mammograms Abd-  Br/ Gen/ Rectal- Not done, not indicated Extrem- cyanosis- none, clubbing, none, atrophy- none, strength- nl Neuro- grossly intact to observation

## 2016-03-22 NOTE — Patient Instructions (Signed)
[facility]   Patient requests opportunity to meet a different primary care physician. Suggest Dr Aida PufferJames Little who is not far from her.  Depo 80     Dx asthma exacerbation   Please don't smoke !

## 2016-03-23 NOTE — Assessment & Plan Note (Signed)
Again discussed available support and encouraged real effort. This is tied up with her chronic anxiety and depression. I think she understands rhinitis important that she quit completely.

## 2016-03-23 NOTE — Assessment & Plan Note (Signed)
Bronchitis predominant Plan-Depo-Medrol. Discussed her respiratory medicines. She is using as instructed.

## 2016-03-23 NOTE — Assessment & Plan Note (Signed)
Chronic situational anxiety and depression. Xanax has been helpful and seems to be used appropriately she does need to be followed by a pain clinic for associated pain medication requests.

## 2016-04-14 ENCOUNTER — Telehealth: Payer: Self-pay | Admitting: Internal Medicine

## 2016-04-14 NOTE — Telephone Encounter (Signed)
Attempted to call the pt but the line is busy.  Will try back later.  

## 2016-04-15 NOTE — Telephone Encounter (Signed)
Im sorry- we can't refill early.
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
## 2016-04-15 NOTE — Telephone Encounter (Signed)
Called the pts number and she is not at home at this time.  Lm for pt to call us back.

## 2016-04-16 NOTE — Telephone Encounter (Signed)
Spoke with pt. She is aware of CY's response. Nothing further was needed at this time. 

## 2016-05-06 ENCOUNTER — Other Ambulatory Visit: Payer: Self-pay | Admitting: Internal Medicine

## 2016-06-02 ENCOUNTER — Telehealth: Payer: Self-pay | Admitting: Internal Medicine

## 2016-06-02 NOTE — Telephone Encounter (Signed)
Pt requesting refill on alprazolam.   Last refill: 02/20/16 Alprazolam 1mg  #90 with 3 refills. Last OV: 03/22/16 Next OV: none- recall for 09/2016 with CY.    CY please advise on refill.  Thanks!

## 2016-06-03 MED ORDER — ALPRAZOLAM 1 MG PO TABS: 1.0000 mg | ORAL_TABLET | Freq: Three times a day (TID) | ORAL | 3 refills | Status: DC | PRN

## 2016-06-03 NOTE — Telephone Encounter (Signed)
Ok to refill as before 

## 2016-06-03 NOTE — Telephone Encounter (Signed)
Spoke with pt. She is aware that we are going to call in her prescription. Rx has been called in. Nothing further was needed.

## 2016-06-04 ENCOUNTER — Telehealth: Payer: Self-pay | Admitting: Internal Medicine

## 2016-06-04 MED ORDER — ALPRAZOLAM 1 MG PO TABS: 1.0000 mg | ORAL_TABLET | Freq: Three times a day (TID) | ORAL | 3 refills | Status: DC | PRN

## 2016-06-04 NOTE — Telephone Encounter (Signed)
Error - pr  

## 2016-06-04 NOTE — Telephone Encounter (Signed)
Spoke with pt. Pt is needing her Xanax called into another pharmacy. When she called yesterday [facility] and this is what was done. Now she is wanting this prescription called into [facility]. I have called [facility] and canceled the prescription I called in yesterday. Another prescription has been called into [facility].

## 2016-06-07 ENCOUNTER — Telehealth: Payer: Self-pay | Admitting: Internal Medicine

## 2016-06-07 NOTE — Telephone Encounter (Signed)
Spoke with Bea with [facility], who wanted to make us aware that she spoke with pt on 06-02-16 who requested a refill for xanax. Bea explained to pt that the earliest that she this RX could be refilled is 06-07-16 as the RX was called in on 05-10-16. Bea states that pt stated that she would be going out of town and needed her RX before today 06-07-16. Pt called back to our office on 06-04-16 and requested a RX be sent to another pharmacy. Bea also states that her pharmacist tech states that she spoke with Mrs. Vandergrift this morning to make her aware that her pulmonary medications were ready for pickup. Pt states she was on her way to pick her RX up, therefore it appears that pt is not out of town. Bea wanted to make us aware of this as this is a consistent thing with this pt.  [facility]

## 2016-06-22 NOTE — Telephone Encounter (Signed)
Noted  

## 2016-08-05 ENCOUNTER — Other Ambulatory Visit: Payer: Self-pay | Admitting: Internal Medicine

## 2016-08-27 ENCOUNTER — Telehealth: Payer: Self-pay | Admitting: Internal Medicine

## 2016-08-27 NOTE — Telephone Encounter (Signed)
Attempted to contact pt. No answer, no option to leave a message. Will try back.  

## 2016-08-28 IMAGING — CT CT ABD-PELV W/O CM
2 of 4 series · 15 of 46 positions shown, 17 images · non-contrast
Comparison: Lumbar spine radiographs - earlier same day; lumbar
spine MRI - 10/30/2012

CLINICAL DATA: Chest and abdominal pain for several months.

EXAM:
CT ABDOMEN AND PELVIS WITHOUT CONTRAST
TECHNIQUE: Multidetector CT imaging of the abdomen and pelvis was performed
following the standard protocol without IV contrast.

[Series 2: abd/ pelvis 5.0 i30f 1 · axial · 0.73mm/px · z∈[+464,+819]mm · 12 of 82 slices shown, 14 images]
[im 7/82  soft-tissue]
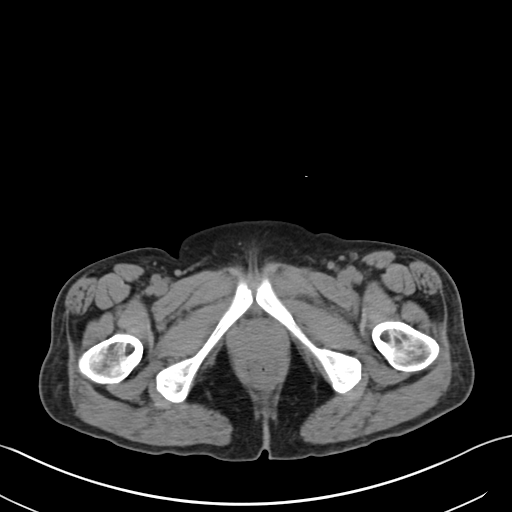
[im 7/82  bone]
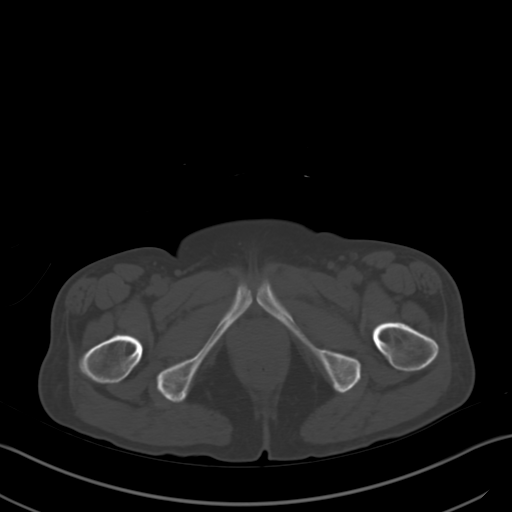
[im 13/82  soft-tissue]
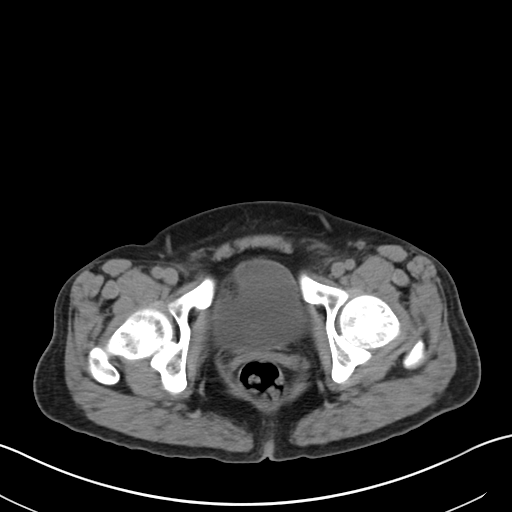
[im 20/82  soft-tissue]
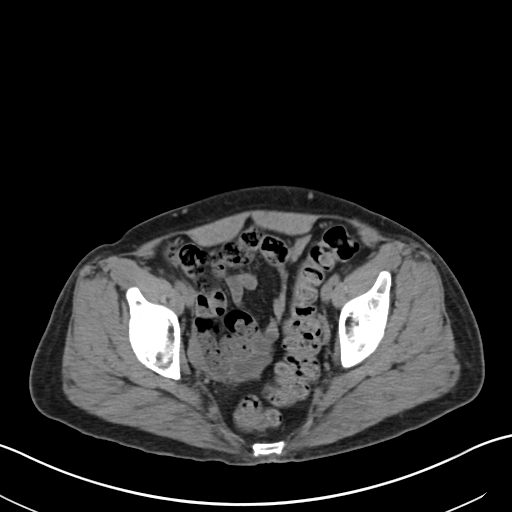
[im 26/82  soft-tissue]
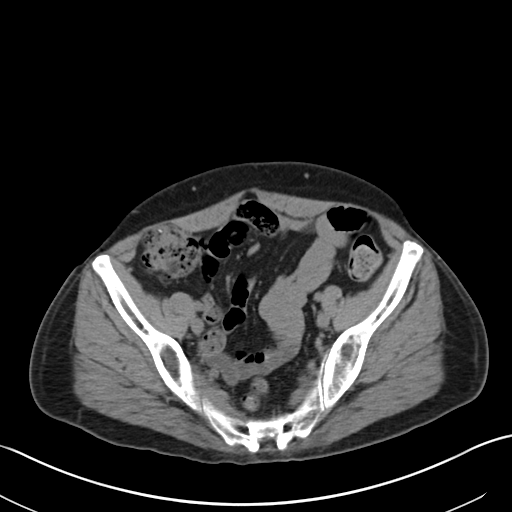
[im 33/82  soft-tissue]
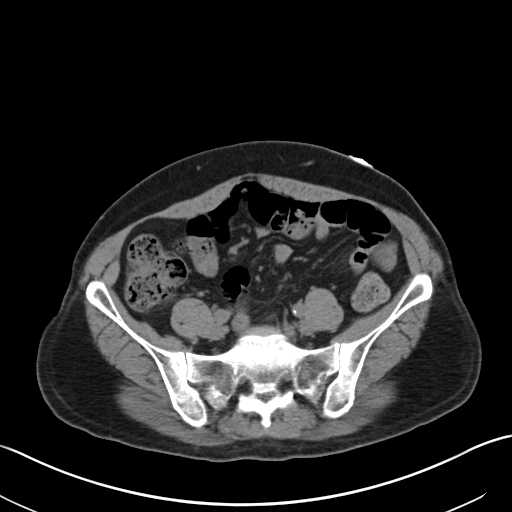
[im 39/82  soft-tissue]
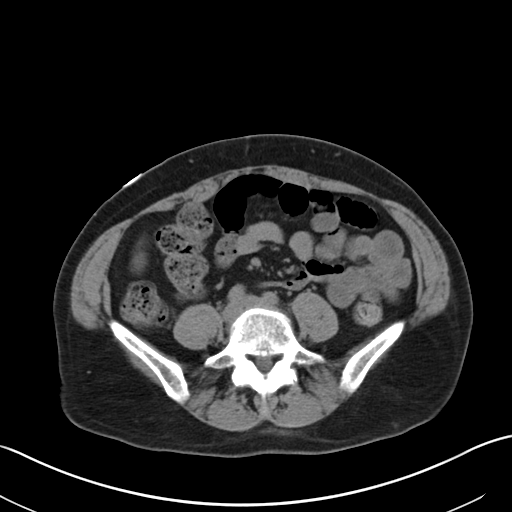
[im 46/82  soft-tissue]
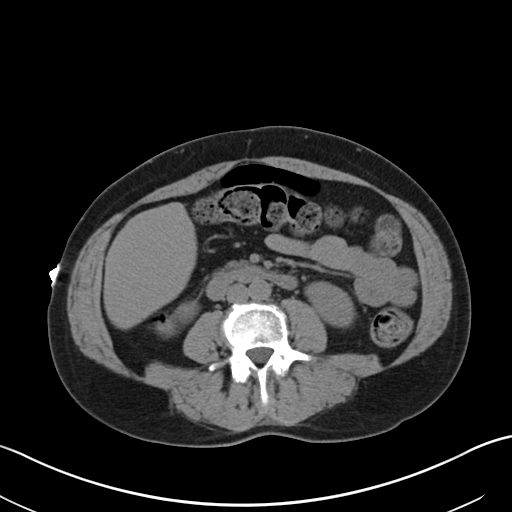
[im 52/82  soft-tissue]
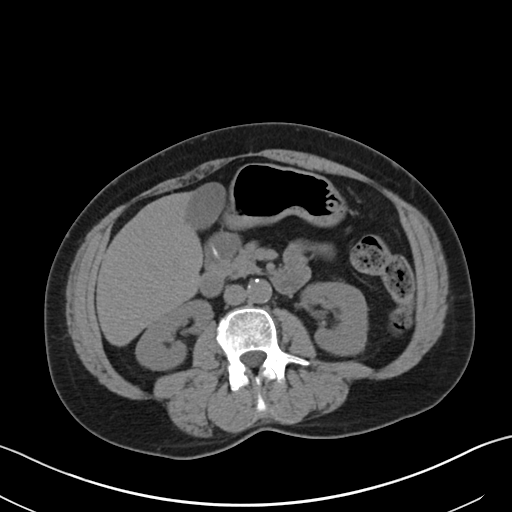
[im 59/82  soft-tissue]
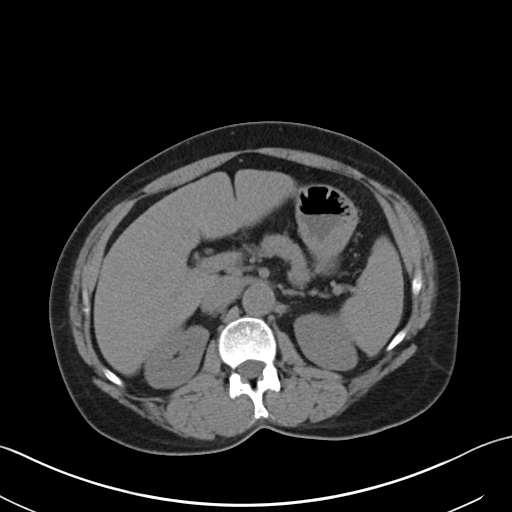
[im 59/82  bone]
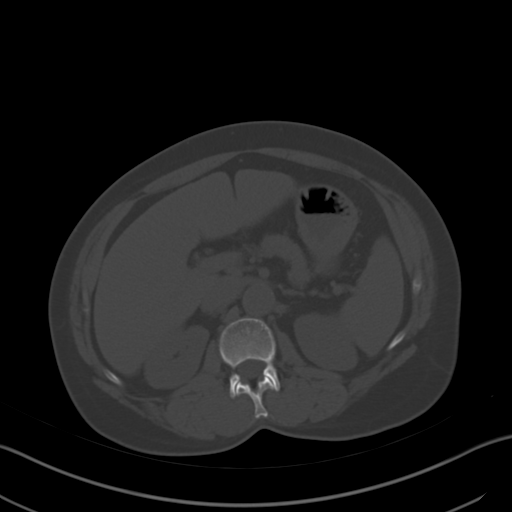
[im 65/82  soft-tissue]
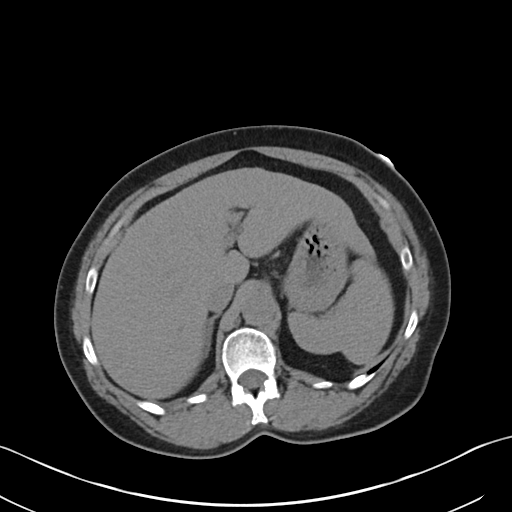
[im 72/82  soft-tissue]
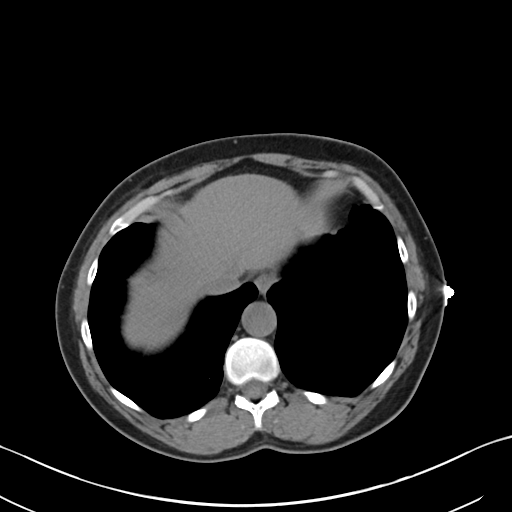
[im 78/82  soft-tissue]
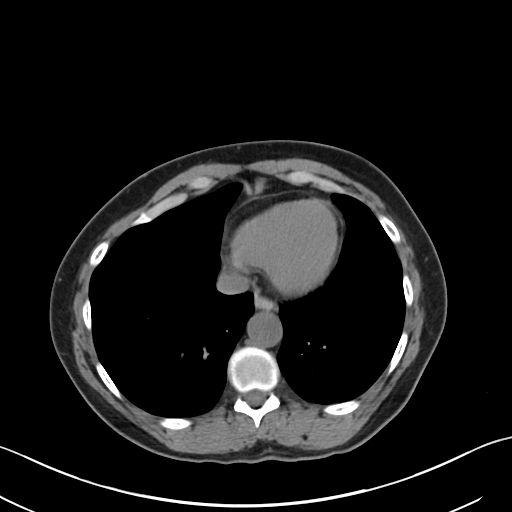

[Series 4: coronals · coronal · 0.70mm/px · 3 of 115 slices shown]
[im 39/115  soft-tissue]
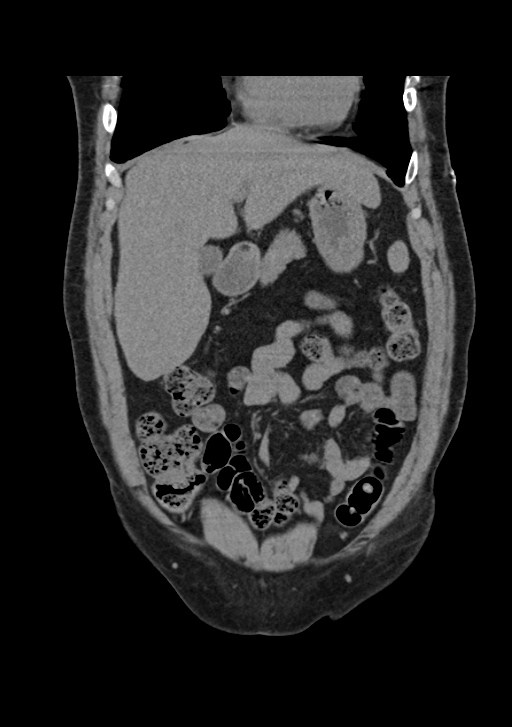
[im 51/115  soft-tissue]
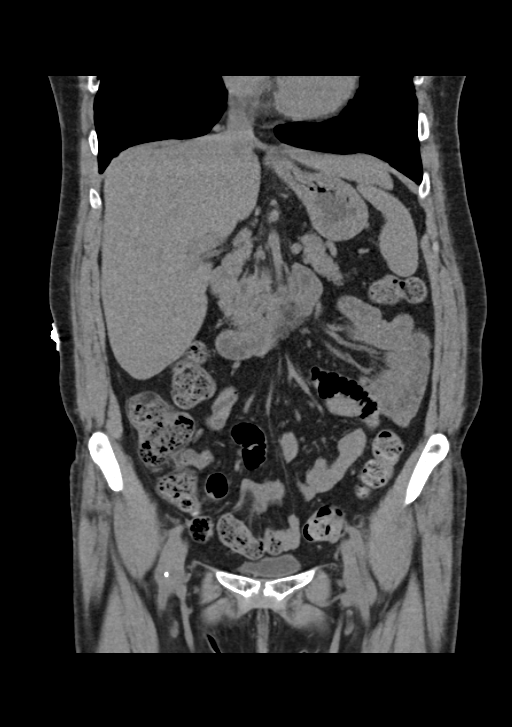
[im 64/115  soft-tissue]
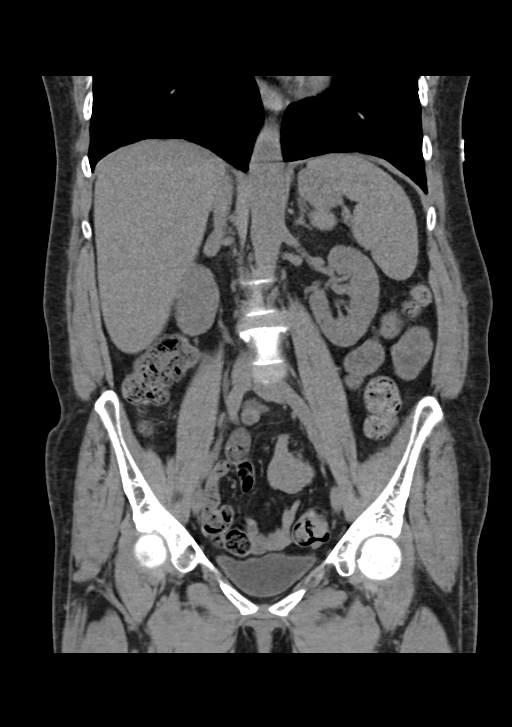

[15 of 46 positions shown; findings below may reference images not displayed]

FINDINGS: The lack of intravenous contrast limits the ability to evaluate
solid abdominal organs.

There is mild nodularity of the hepatic contour (representative
image 19, series 2). Normal noncontrast appearance of the
gallbladder given degree of distention. No radiopaque gallstones. No
ascites.

Normal noncontrast appearance of the bilateral kidneys. No renal
stones. No renal stones are seen along the expected course of either
ureter or the urinary bladder. Normal noncontrast appearance of the
urinary bladder given degree distention. No urinary obstruction a
perinephric stranding.

Normal noncontrast appearance of the bilateral adrenal glands,
pancreas and spleen.

Large colonic stool burden without evidence of enteric obstruction.
The bowel is normal in course and caliber without wall thickening.
Normal noncontrast appearance of the retrocecal appendix. No
pneumoperitoneum, pneumatosis or portal venous gas. A radiopaque
presumed pill fragment is noted within the duodenal bulb.

Scattered minimal calcified atherosclerotic plaque within a normal
caliber abdominal aorta.

Scattered shotty porta hepatis lymph nodes are individually not
enlarged by size criteria with index porta hepatis node measuring
0.8 cm in greatest short axis diameter (image 27, series 2). No
bulky retroperitoneal, mesenteric, pelvic or inguinal
lymphadenopathy on this noncontrast examination.

Limited visualization of the lower thorax demonstrates advanced
centrilobular emphysematous change. There is minimal dependent
subpleural ground-glass atelectasis, left greater than right. No
discrete focal airspace opacities. No pleural effusion.

Normal heart size.  No pericardial effusion.

No acute or aggressive osseous abnormalities. Mild scoliotic
curvature of the thoracolumbar spine, possibly positional.

There is a minimal amount of subcutaneous edema within the midline
of the lower back. Regional soft tissues appear otherwise normal.
IMPRESSION: 1. No explanation for patient's persistent chest and back pain.
2. Emphysematous exchange within the imaged bilateral lung bases.
3. Mild nodularity of the hepatic contour, nonspecific though could
be seen in the setting of early cirrhotic change. Correlation with
LFTs is recommended.

## 2016-08-28 IMAGING — DX DG LUMBAR SPINE COMPLETE 4+V
5 series · 5 of 5 positions shown · non-contrast
Comparison: January 16, 2014

CLINICAL DATA: Back pain, worsening since [REDACTED]. Swelling of
bilateral lower extremities.

EXAM:
LUMBAR SPINE - COMPLETE 4+ VIEW

[l-spine ap]
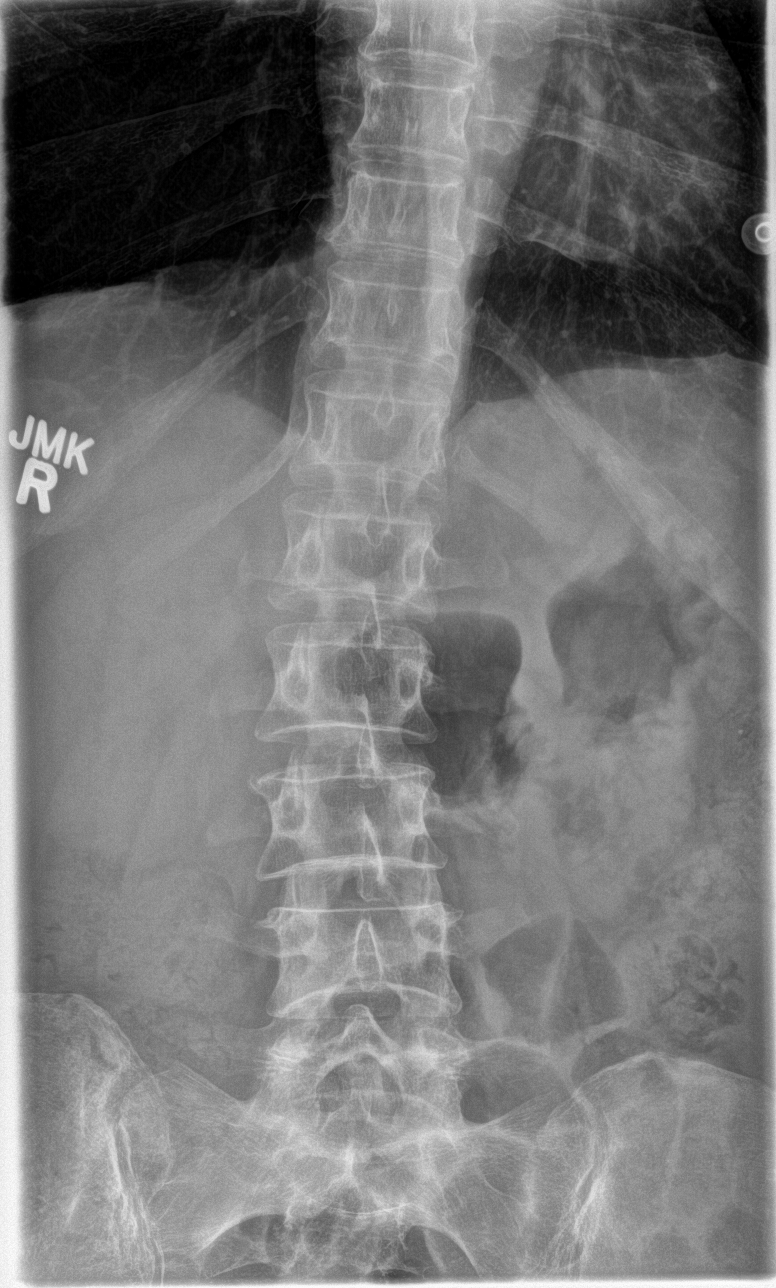

[l-spine obl (1 of 2)]
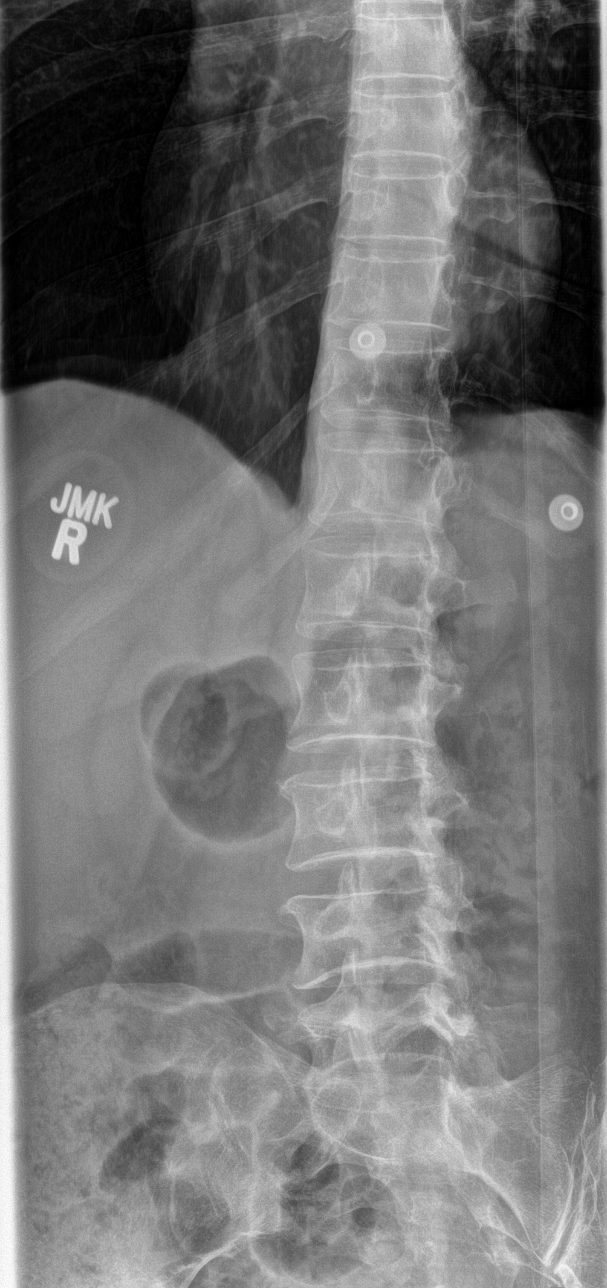

[l-spine obl (2 of 2)]
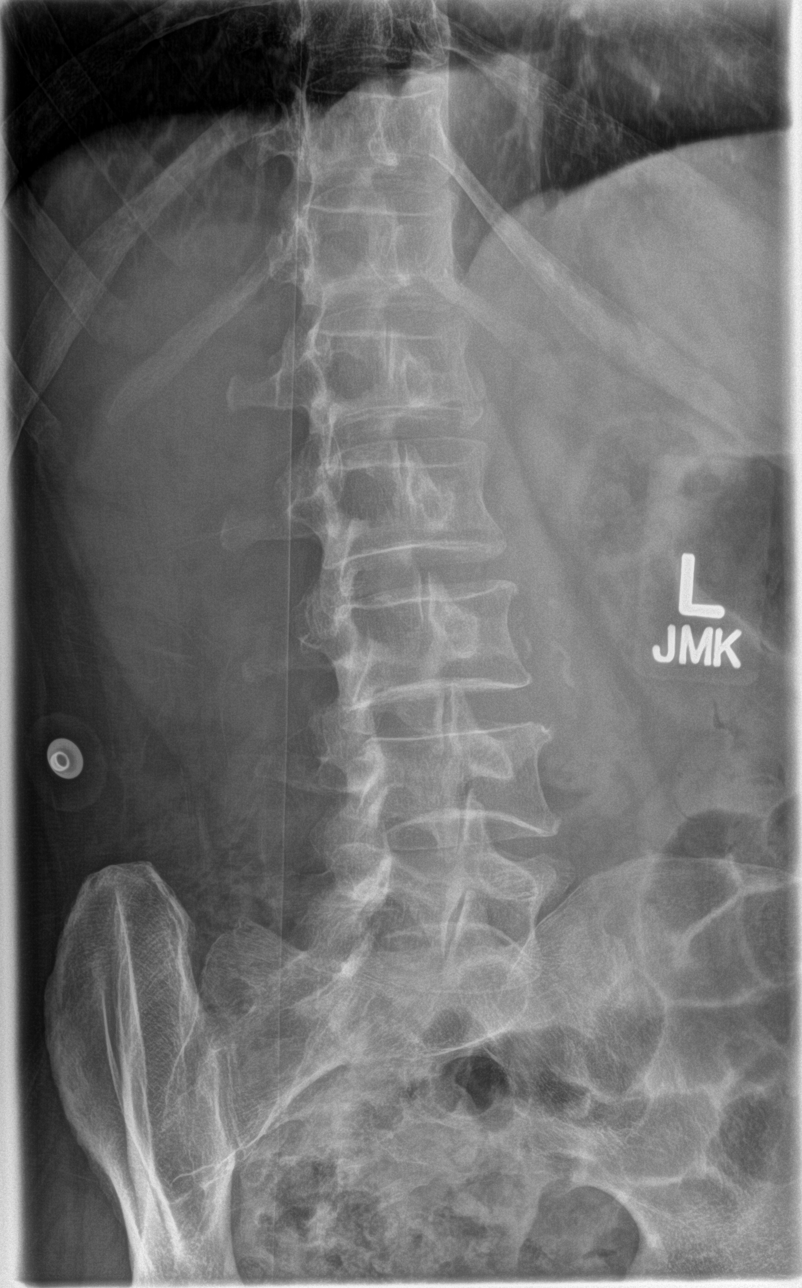

[l-spine lat]
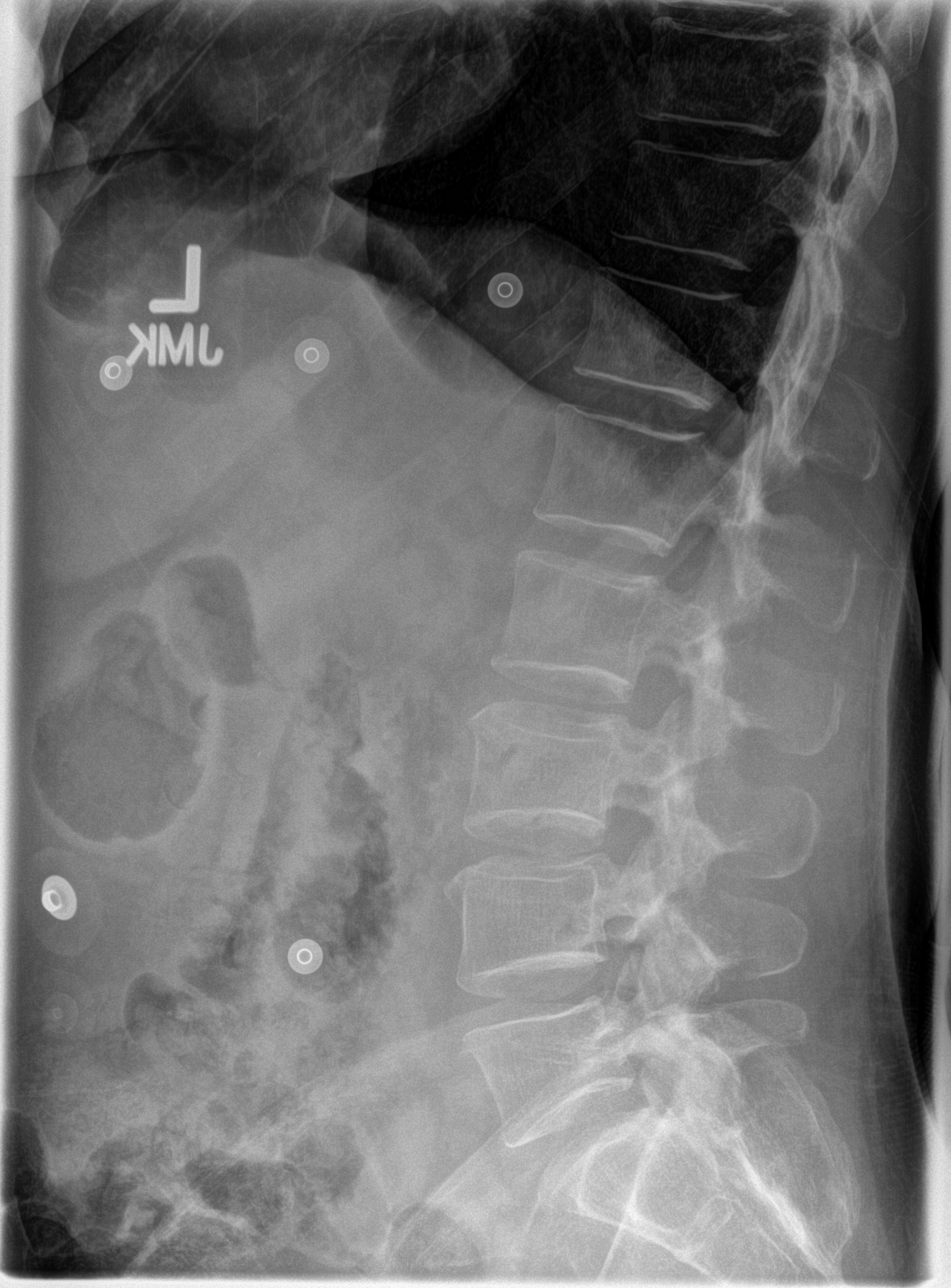

[l-spine spot]
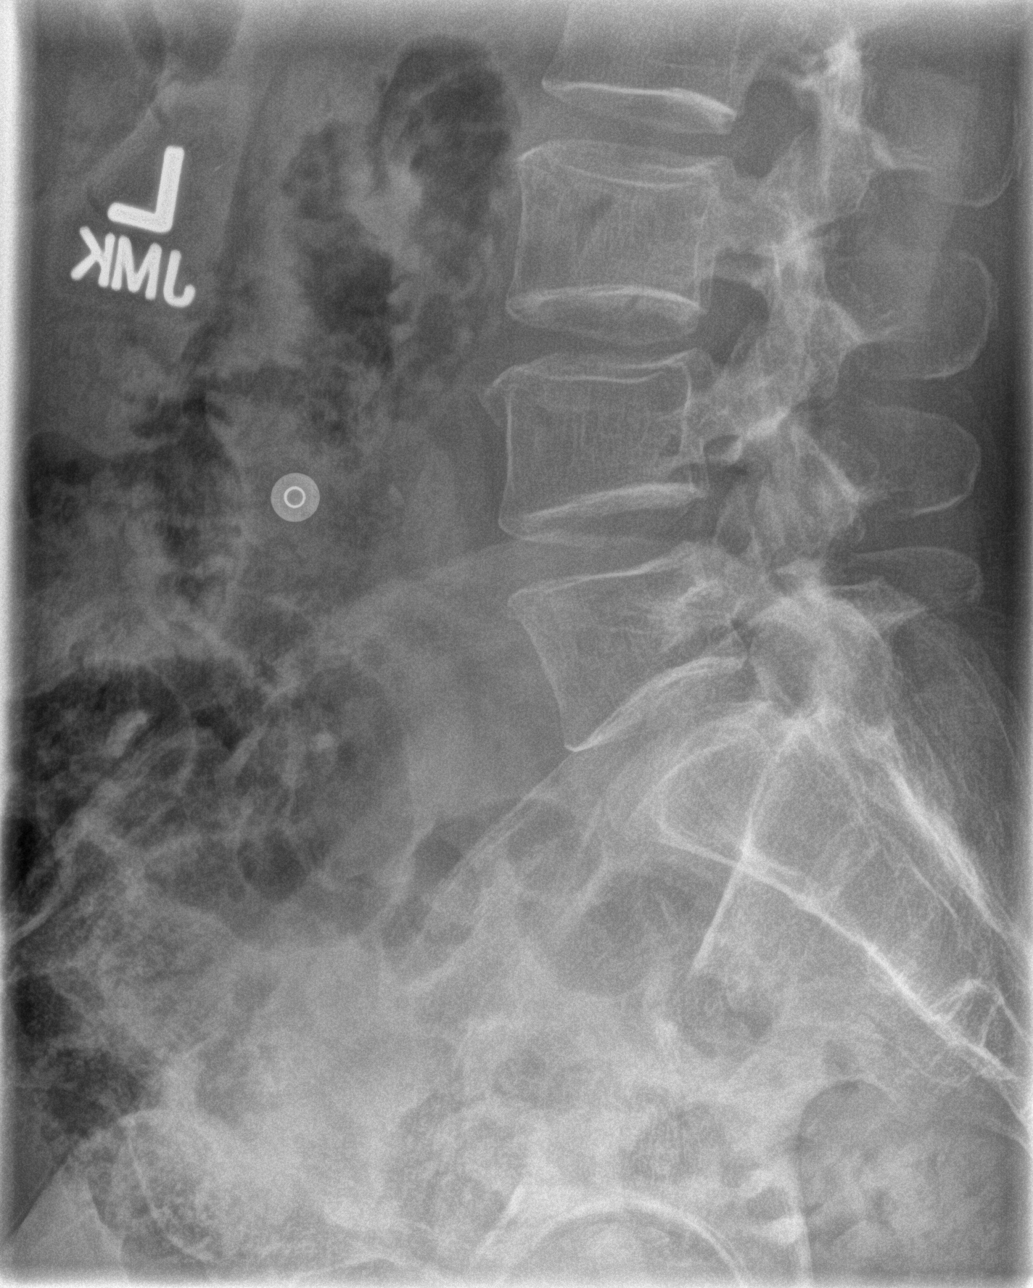

[5 of 5 positions shown; findings below may reference images not displayed]

FINDINGS: There is no evidence of lumbar spine fracture. There is scoliosis of
spine. There is anterior osteophytosis at L4 unchanged.
Intervertebral disc spaces are maintained.
IMPRESSION: Minimal degenerative joint changes at L4. No acute fracture or
dislocation identified.

## 2016-08-30 NOTE — Telephone Encounter (Signed)
Called and spoke with pt and she stated that she needs her flexeril and the xanax refilled and she would like to have some prednisone called in as well.  She stated that with all the changing of the weather that she has been having some difficulty with her breathing and stated that the prednisone would help her out.  She is scheduled to come in and see CY on 10/11/16.  CY please advise. Thanks  Allergies  Allergen Reactions  . Aspirin Nausea And Vomiting

## 2016-08-30 NOTE — Telephone Encounter (Signed)
ATC pt, no answer, no voicemail. WCB Rxs pending until patient calls back to verify pharmacy

## 2016-08-30 NOTE — Telephone Encounter (Signed)
Ok to refill xanax as before. I don't see where I gave flexeril so she got that somewhere else.                   Ok prednisone 10 mg, # 10, 1 or 2 daily   No refill

## 2016-08-31 NOTE — Telephone Encounter (Signed)
lmtcb x1 for pt. 

## 2016-09-01 ENCOUNTER — Telehealth: Payer: Self-pay | Admitting: Internal Medicine

## 2016-09-01 MED ORDER — PREDNISONE 10 MG PO TABS: 10.0000 mg | ORAL_TABLET | Freq: Every day | ORAL | 0 refills | Status: DC

## 2016-09-01 MED ORDER — ALPRAZOLAM 1 MG PO TABS: 1.0000 mg | ORAL_TABLET | Freq: Three times a day (TID) | ORAL | 3 refills | Status: DC | PRN

## 2016-09-01 NOTE — Telephone Encounter (Signed)
[facility] is not open at this time.  Called and spoke with pt and she is aware that these meds will be called in for her.  Will need to call the pharmacy around 9 when they open.

## 2016-09-01 NOTE — Telephone Encounter (Signed)
Called [facility] and spoke with pharm tech SarahsvilleStephanie - verbal authorization given for the alprazolam 1mg  TID prn #90 w/ 3 refills and prednisone 10mg  1-2tabs daily #10 as stated by CDY.  Med list updated.  Called spoke with patient and informed her of the above Pt is aware to keep her 3.5.17 ov w/ CDY  Nothing further needed; will sign off

## 2016-09-01 NOTE — Telephone Encounter (Signed)
CY  Please Advise  PT. Called and stated she has a new rx sent into her pharmacy today but she can not get her new rx filled early. She said what happened was that she knocked her pills accidentally down the drain.

## 2016-09-02 ENCOUNTER — Other Ambulatory Visit: Payer: Self-pay | Admitting: Internal Medicine

## 2016-09-02 NOTE — Telephone Encounter (Signed)
Pt aware of CY's recommendation and voiced her understanding. Nothing further needed.

## 2016-09-02 NOTE — Telephone Encounter (Signed)
Sorry- can't help

## 2016-09-20 ENCOUNTER — Telehealth: Payer: Self-pay | Admitting: Internal Medicine

## 2016-09-20 NOTE — Telephone Encounter (Signed)
Offer Zpak  250 mg, # 6, 2 today then one daily              Prednisone 10 mg, # 20, 4 X 2 DAYS, 3 X 2 DAYS, 2 X 2 DAYS, 1 X 2 DAYS  

## 2016-09-20 NOTE — Telephone Encounter (Signed)
ATC-unable to leave vm. Will send in RX, after verifying pharmacy wcb

## 2016-09-20 NOTE — Telephone Encounter (Signed)
Spoke with pt. States that she is having a flare up. Reports cough, DOE, wheezing and chest tightness. Cough is producing green/brown mucus. Denies fever/chills/sweats. Has not tried any OTC medications. Symptoms started last week. Would like an antibiotic and prednisone.  CY - please advise. Thanks.  Allergies  Allergen Reactions  . Aspirin Nausea And Vomiting   Current Outpatient Prescriptions on File Prior to Visit  Medication Sig Dispense Refill  . albuterol (PROAIR HFA) 108 (90 Base) MCG/ACT inhaler 2 puffs every 4-6 hours if needed- rescue 18 g 12  . albuterol (PROVENTIL) (2.5 MG/3ML) 0.083% nebulizer solution Take 3 mLs (2.5 mg total) by nebulization every 6 (six) hours as needed. DX 492.8 360 mL 3  . ALPRAZolam (XANAX) 1 MG tablet Take 1 tablet (1 mg total) by mouth 3 (three) times daily as needed for anxiety. 90 tablet 3  . cetirizine (ZYRTEC) 10 MG tablet Take 1 tablet (10 mg total) by mouth daily. 10 tablet 12  . fluticasone (FLONASE) 50 MCG/ACT nasal spray Place 2 sprays into both nostrils daily. 16 g 11  . Ipratropium-Albuterol (COMBIVENT RESPIMAT) 20-100 MCG/ACT AERS respimat 1 puff every 6 hours if needed 4 g 12  . lisinopril (PRINIVIL,ZESTRIL) 40 MG tablet Take 40 mg by mouth daily.    . metFORMIN (GLUCOPHAGE) 500 MG tablet Take 500 mg by mouth 2 (two) times daily with a meal.     . mometasone-formoterol (DULERA) 100-5 MCG/ACT AERO 2 puffs and rinse mouth, twice daily- maintenance 13 g 11  . Multiple Vitamin (MULTIVITAMIN WITH MINERALS) TABS Take 1 tablet by mouth daily.    Marland Kitchen. omeprazole (PRILOSEC) 20 MG capsule TAKE ONE CAPSULE BY MOUTH DAILY 30 capsule 5  . predniSONE (DELTASONE) 10 MG tablet Take 1-2 tablets (10-20 mg total) by mouth daily with breakfast. 10 tablet 0  . SPIRIVA HANDIHALER 18 MCG inhalation capsule PLACE 1 CAPSULE INTO INHALER AND INHALE ONCE DAILY AS DIRECTED 30 capsule 3   No current facility-administered medications on file prior to visit.

## 2016-09-21 MED ORDER — AZITHROMYCIN 250 MG PO TABS: ORAL_TABLET | ORAL | 0 refills | Status: AC

## 2016-09-21 MED ORDER — PREDNISONE 10 MG PO TABS: ORAL_TABLET | ORAL | 0 refills | Status: DC

## 2016-09-21 NOTE — Telephone Encounter (Signed)
Pt returning call and says sent to [facility].Caren GriffinsStanley A Dalton

## 2016-09-21 NOTE — Telephone Encounter (Signed)
Rxs have been sent in to pt's preferred pharmacy.

## 2016-10-11 ENCOUNTER — Ambulatory Visit (INDEPENDENT_AMBULATORY_CARE_PROVIDER_SITE_OTHER)
Admission: RE | Admit: 2016-10-11 | Discharge: 2016-10-11 | Disposition: A | Discharge status: Home / Self Care | Payer: Medicaid Other | Source: Ambulatory Visit | Attending: Internal Medicine | Admitting: Internal Medicine

## 2016-10-11 ENCOUNTER — Telehealth: Payer: Self-pay | Admitting: Internal Medicine

## 2016-10-11 ENCOUNTER — Ambulatory Visit (INDEPENDENT_AMBULATORY_CARE_PROVIDER_SITE_OTHER): Payer: Medicaid Other | Admitting: Internal Medicine

## 2016-10-11 ENCOUNTER — Encounter: Payer: Self-pay | Admitting: Internal Medicine

## 2016-10-11 VITALS — BP 114/72 | HR 101 | Ht 62.0 in | Wt 114.6 lb

## 2016-10-11 DIAGNOSIS — J449 Chronic obstructive pulmonary disease, unspecified: Secondary | ICD-10-CM | POA: Diagnosis not present

## 2016-10-11 DIAGNOSIS — K219 Gastro-esophageal reflux disease without esophagitis: Secondary | ICD-10-CM

## 2016-10-11 DIAGNOSIS — J9611 Chronic respiratory failure with hypoxia: Secondary | ICD-10-CM | POA: Diagnosis not present

## 2016-10-11 DIAGNOSIS — Z720 Tobacco use: Secondary | ICD-10-CM | POA: Diagnosis not present

## 2016-10-11 DIAGNOSIS — F411 Generalized anxiety disorder: Secondary | ICD-10-CM | POA: Diagnosis not present

## 2016-10-11 MED ORDER — ALPRAZOLAM 1 MG PO TABS: 1.0000 mg | ORAL_TABLET | Freq: Three times a day (TID) | ORAL | 3 refills | Status: DC | PRN

## 2016-10-11 NOTE — Progress Notes (Signed)
Patient ID: Megan Soto, female    DOB: 1961-07-15, 56 y.o.   MRN: [id number]  HPI  female smoker followed for COPD, complicated by anxiety, DM, GERD, musculoskeletal pain/pain clinic, hepatitis C Walk test on room air 10/11/16 -Room air resting saturation 88%, desaturated to 86% walking on room air, recovered on 3 L to 97% Office Spirometry 10/11/2016-severe obstructive airways disease-FVC 2.30/73%, FEV1 1.21/49%, ratio 0.52 --------------------------------------------------------------------------------------  10/11/2016-56 year old female smoker followed for COPD, hypoxic respiratory failure, complicated by anxiety, DM, GERD, musculoskeletal pain/pain clinic, hepatitis C O2 2 L/[facility] Continues to smoke Room air resting saturation 88%, desaturated to 86% walking on room air, recovered on 3 L to 97% FOLLOWS FOR: Pt continues to need O2 through out the day and night. Pt continues to have SOB and wheezing as well. Pt under more stress lately. Husband dx'd stage 4 lung cancer./ Hospice CXR 11/03/2015-NAD lisinopril Office Spirometry 10/11/2016-severe obstructive airways disease-FVC 2.30/73%, FEV1 1.21/49%, ratio 0.52  ROS-see HPI   Negative unless "+" Constitutional:    weight loss, night sweats, fevers, chills, fatigue, lassitude. HEENT:    headaches, difficulty swallowing, tooth/dental problems, sore throat,       sneezing, itching, ear ache, nasal congestion, post nasal drip, snoring CV:    chest pain, orthopnea, PND, swelling in lower extremities, anasarca,                                               dizziness, palpitations Resp:  + shortness of breath with exertion or at rest.                productive cough,   + non-productive cough, coughing up of blood.              change in color of mucus.  + wheezing.   Skin:    rash or lesions. GI:  No-   heartburn, indigestion, abdominal pain, nausea, vomiting, GU: dysuria, change in color of urine, no urgency or frequency.   flank pain. MS:   + joint pain, stiffness, decreased range of motion, back pain. Neuro-     nothing unusual Psych:  change in mood or affect.  +depression or anxiety.   memory loss.  OBJ- Physical Exam General- Alert, Oriented, Affect-pleasant/  anxious, Distress- none acute Skin- rash-none, lesions- none, excoriation- none Lymphadenopathy- none Head- atraumatic            Eyes- Gross vision intact, PERRLA, conjunctivae and secretions clear            Ears- Hearing, canals-normal            Nose- Clear, no-Septal dev, mucus, polyps, erosion, perforation             Throat- Mallampati II , mucosa clear , drainage- none, tonsils- atrophic Neck- flexible , trachea midline, no stridor , thyroid nl, carotid no bruit Chest - symmetrical excursion , unlabored           Heart/CV- RRR , no murmur , no gallop  , no rub, nl s1 s2                           - JVD- none , edema- none, stasis changes- none, varices- none           Lung- + little coughing, wheeze + with forced expiration,  dullness-none, rub- none9 Abd-  Br/ Gen/ Rectal- Not done, not indicated Extrem- cyanosis- none, clubbing, none, atrophy- none, strength- nl Neuro- grossly intact to observation

## 2016-10-11 NOTE — Telephone Encounter (Signed)
[facility] [place] [facility] [facility] - pt is at pharmacy to pick up Rx for Xanax 1mg  which was called in after appt this morning with CY. Judeth CornfieldStephanie states that she is being told by the patient and she was told she could go ahead and fill these but Judeth CornfieldStephanie is stating that this refill is 11+ days early -- last filled on 09/23/16 #90.  ------ Per Dr Maple HudsonYoung, okay to fill this one time early. Nothing further needed.

## 2016-10-11 NOTE — Patient Instructions (Signed)
Order- DME [facility]- please evaluate for light portable O2 concentrator  2 -3 L pulse Continuous and portable O2     Dx   COPD mixed type, chronic hypoxic respiratory failure   Order CXR   Dx COPD mixed type  Order Office Spirometry

## 2016-10-17 DIAGNOSIS — J9611 Chronic respiratory failure with hypoxia: Secondary | ICD-10-CM | POA: Insufficient documentation

## 2016-10-17 NOTE — Assessment & Plan Note (Addendum)
Smoking cessation is basic for managing this as emphasized today. Plan-CXR, spirometry done and reviewed. Medications reviewed. Call for refills when needed.

## 2016-10-17 NOTE — Assessment & Plan Note (Signed)
Smoking cessation efforts discussed, with daughter present. Her anxiety continues to get in the way of significant effort.

## 2016-10-17 NOTE — Assessment & Plan Note (Signed)
Importance of careful long-term reflux precautions was emphasized

## 2016-10-17 NOTE — Assessment & Plan Note (Signed)
She qualifies for portable oxygen concentrator.

## 2016-10-17 NOTE — Assessment & Plan Note (Signed)
I agreed to refill Xanax with discussion

## 2016-11-18 ENCOUNTER — Telehealth: Payer: Self-pay | Admitting: Internal Medicine

## 2016-11-18 NOTE — Telephone Encounter (Signed)
Spoke with Gilda at [facility], who states pt will need to be requalified and a OV within 30 days. Gilda states this is due to the order stating to "evaluate" pt for POC and not provide. Pt was evaluated for POC on 10-20-16 by [facility]. I asked Gilda why it took so long to make us aware of this situation after being evaluted, her response was due to processing the ordering and running insurance.   CY please advise. Thanks.  

## 2016-11-18 NOTE — Telephone Encounter (Signed)
Spoke with Angelica Chessman at [facility] is taking care of this order and will contact me if any further situations arise. Nothing more needed at this time.

## 2016-12-03 ENCOUNTER — Other Ambulatory Visit: Payer: Self-pay | Admitting: Internal Medicine

## 2016-12-11 ENCOUNTER — Telehealth: Payer: Self-pay | Admitting: Pulmonary Disease

## 2016-12-11 NOTE — Telephone Encounter (Addendum)
   Pt calling for congestion + worse allergies + SOB + cough.  Denies fevers, chills.  She sees Dr. Maple HudsonYoung for COPD  Phone got disconnected.  I called her and she would not answer.    Pollie MeyerJ. Angelo A de Dios, MD 12/11/2016, 7:16 PM [facility] Pager [phone] After 3 pm or if no answer, call [phone]

## 2016-12-13 ENCOUNTER — Telehealth: Payer: Self-pay | Admitting: Internal Medicine

## 2016-12-13 MED ORDER — DOXYCYCLINE HYCLATE 100 MG PO TABS
ORAL_TABLET | ORAL | 0 refills | Status: DC
Start: 2016-12-13 — End: 2017-05-16

## 2016-12-13 NOTE — Telephone Encounter (Signed)
Spoke with pt, who reports of prod cough with brown mucus mixed with light pink blood, increased sob, wheezing & chest discomfort x1w. Pt is requesting an Rx for abx.  CY please advise. Thanks.  Allergies  Allergen Reactions  . Aspirin Nausea And Vomiting   Current Outpatient Prescriptions on File Prior to Visit  Medication Sig Dispense Refill  . albuterol (PROAIR HFA) 108 (90 Base) MCG/ACT inhaler 2 puffs every 4-6 hours if needed- rescue 18 g 12  . albuterol (PROVENTIL) (2.5 MG/3ML) 0.083% nebulizer solution Take 3 mLs (2.5 mg total) by nebulization every 6 (six) hours as needed. DX 492.8 360 mL 3  . ALPRAZolam (XANAX) 1 MG tablet Take 1 tablet (1 mg total) by mouth 3 (three) times daily as needed for anxiety. 90 tablet 3  . cetirizine (ZYRTEC) 10 MG tablet Take 1 tablet (10 mg total) by mouth daily. 10 tablet 12  . fluticasone (FLONASE) 50 MCG/ACT nasal spray Place 2 sprays into both nostrils daily. 16 g 11  . Ipratropium-Albuterol (COMBIVENT RESPIMAT) 20-100 MCG/ACT AERS respimat 1 puff every 6 hours if needed 4 g 12  . lisinopril (PRINIVIL,ZESTRIL) 40 MG tablet Take 40 mg by mouth daily.    . metFORMIN (GLUCOPHAGE) 500 MG tablet Take 500 mg by mouth 2 (two) times daily with a meal.     . mometasone-formoterol (DULERA) 100-5 MCG/ACT AERO 2 puffs and rinse mouth, twice daily- maintenance 13 g 11  . Multiple Vitamin (MULTIVITAMIN WITH MINERALS) TABS Take 1 tablet by mouth daily.    Marland Kitchen. omeprazole (PRILOSEC) 20 MG capsule TAKE ONE CAPSULE BY MOUTH DAILY 30 capsule 5  . predniSONE (DELTASONE) 10 MG tablet Take 1-2 tablets (10-20 mg total) by mouth daily with breakfast. 10 tablet 0  . SPIRIVA HANDIHALER 18 MCG inhalation capsule PLACE 1 CAPSULE INTO INHALER AND INHALE ONCE DAILY AS DIRECTED 30 capsule 3   No current facility-administered medications on file prior to visit.

## 2016-12-13 NOTE — Telephone Encounter (Signed)
Rx has been sent to preferred pharmacy.  Pt is aware and voiced her understanding. Nothing further needed.  

## 2016-12-13 NOTE — Telephone Encounter (Signed)
Offer doxycycline 100 mg, # 8, 2 today then one daily  Can also take otc Mucinex-DM or Delsym if needed  Please let us know if keep seeing blood in sputum

## 2017-01-24 ENCOUNTER — Telehealth: Payer: Self-pay | Admitting: Internal Medicine

## 2017-01-24 MED ORDER — ALPRAZOLAM 1 MG PO TABS: 1.0000 mg | ORAL_TABLET | Freq: Three times a day (TID) | ORAL | 3 refills | Status: DC | PRN

## 2017-01-24 NOTE — Telephone Encounter (Signed)
Spoke with pt, aware of refills.  Reviewed dosage with pt.  rx sent to preferred pharmacy.  Nothing further needed.

## 2017-01-24 NOTE — Telephone Encounter (Signed)
Ok to refill. This should last 4 months

## 2017-01-24 NOTE — Telephone Encounter (Signed)
ATC-no answer with no option to leave vm.  wcb

## 2017-01-24 NOTE — Telephone Encounter (Signed)
Pt requesting refill on Xanax 1mg  tab to be sent to [facility].   Last refill: 10/11/16 #90 with 3 refills, take 1 tab TID PRN anxiety Last ov: 10/11/16 Next ov: 02/10/17  CY please advise on refill.  Thanks!

## 2017-01-24 NOTE — Telephone Encounter (Signed)
Patient returning call - pt can be reached at [phone] -pr

## 2017-02-07 ENCOUNTER — Other Ambulatory Visit: Payer: Self-pay | Admitting: Internal Medicine

## 2017-02-10 ENCOUNTER — Ambulatory Visit: Payer: Medicaid Other | Admitting: Internal Medicine

## 2017-02-17 DIAGNOSIS — R0902 Hypoxemia: Secondary | ICD-10-CM | POA: Diagnosis not present

## 2017-02-17 DIAGNOSIS — J449 Chronic obstructive pulmonary disease, unspecified: Secondary | ICD-10-CM | POA: Diagnosis not present

## 2017-02-22 ENCOUNTER — Other Ambulatory Visit: Payer: Self-pay | Admitting: Internal Medicine

## 2017-02-24 ENCOUNTER — Other Ambulatory Visit: Payer: Self-pay | Admitting: Family Medicine

## 2017-02-24 DIAGNOSIS — F17200 Nicotine dependence, unspecified, uncomplicated: Secondary | ICD-10-CM

## 2017-02-24 DIAGNOSIS — J449 Chronic obstructive pulmonary disease, unspecified: Secondary | ICD-10-CM

## 2017-02-24 DIAGNOSIS — B1920 Unspecified viral hepatitis C without hepatic coma: Secondary | ICD-10-CM

## 2017-03-02 ENCOUNTER — Inpatient Hospital Stay
Admission: RE | Admit: 2017-03-02 | Discharge: 2017-03-02 | Disposition: A | Discharge status: Home / Self Care | Payer: Medicaid Other | Source: Ambulatory Visit | Attending: Family Medicine | Admitting: Family Medicine

## 2017-03-09 ENCOUNTER — Ambulatory Visit
Admission: RE | Admit: 2017-03-09 | Discharge: 2017-03-09 | Disposition: A | Discharge status: Home / Self Care | Payer: Medicaid Other | Source: Ambulatory Visit | Attending: Family Medicine | Admitting: Family Medicine

## 2017-03-09 DIAGNOSIS — J449 Chronic obstructive pulmonary disease, unspecified: Secondary | ICD-10-CM

## 2017-03-09 DIAGNOSIS — B1920 Unspecified viral hepatitis C without hepatic coma: Secondary | ICD-10-CM

## 2017-03-09 DIAGNOSIS — F17200 Nicotine dependence, unspecified, uncomplicated: Secondary | ICD-10-CM

## 2017-03-30 ENCOUNTER — Ambulatory Visit: Payer: Medicaid Other | Admitting: Internal Medicine

## 2017-04-06 ENCOUNTER — Other Ambulatory Visit: Payer: Self-pay | Admitting: Internal Medicine

## 2017-04-07 ENCOUNTER — Other Ambulatory Visit: Payer: Self-pay | Admitting: Internal Medicine

## 2017-04-13 ENCOUNTER — Other Ambulatory Visit: Payer: Self-pay | Admitting: Internal Medicine

## 2017-04-20 ENCOUNTER — Encounter: Payer: Medicaid Other | Admitting: Internal Medicine

## 2017-04-21 ENCOUNTER — Encounter: Payer: Medicaid Other | Admitting: Internal Medicine

## 2017-05-06 ENCOUNTER — Telehealth: Payer: Self-pay | Admitting: Internal Medicine

## 2017-05-06 MED ORDER — AZITHROMYCIN 250 MG PO TABS: ORAL_TABLET | ORAL | 0 refills | Status: DC

## 2017-05-06 NOTE — Telephone Encounter (Signed)
Called and spoke with pt and she is aware of CY recs. zpak has been sent to the pharmacy and nothing further is needed.

## 2017-05-06 NOTE — Telephone Encounter (Signed)
Offer Zpak   250 mg, # 6, 2 today then one daily  Stay well hydrated. Ok to use otc cough and cold remedies as needed.

## 2017-05-06 NOTE — Telephone Encounter (Signed)
Called and spoke with pt and she stated that she has been feeling bad for about 2 days---chest cold with cough and white sputum.  She denies any fever, chills or sweats. Pt is wanting to get this caught early and treated before it gets worse.  CY please advise. Thanks  Last ov--10/11/16 Next ov--05/16/17  Allergies  Allergen Reactions  . Aspirin Nausea And Vomiting

## 2017-05-16 ENCOUNTER — Ambulatory Visit (INDEPENDENT_AMBULATORY_CARE_PROVIDER_SITE_OTHER): Payer: Medicaid Other | Admitting: Internal Medicine

## 2017-05-16 ENCOUNTER — Encounter: Payer: Self-pay | Admitting: Internal Medicine

## 2017-05-16 DIAGNOSIS — Z23 Encounter for immunization: Secondary | ICD-10-CM

## 2017-05-16 DIAGNOSIS — Z720 Tobacco use: Secondary | ICD-10-CM

## 2017-05-16 DIAGNOSIS — J449 Chronic obstructive pulmonary disease, unspecified: Secondary | ICD-10-CM | POA: Diagnosis not present

## 2017-05-16 DIAGNOSIS — J9611 Chronic respiratory failure with hypoxia: Secondary | ICD-10-CM | POA: Diagnosis not present

## 2017-05-16 MED ORDER — ALPRAZOLAM 1 MG PO TABS: 1.0000 mg | ORAL_TABLET | Freq: Three times a day (TID) | ORAL | 3 refills | Status: DC | PRN

## 2017-05-16 MED ORDER — GUAIFENESIN-CODEINE 100-10 MG/5ML PO SYRP: 5.0000 mL | ORAL_SOLUTION | Freq: Three times a day (TID) | ORAL | 0 refills | Status: DC | PRN

## 2017-05-16 NOTE — Patient Instructions (Signed)
Script printed for cough syrup and xanax refill  Flu vax  Standard    Please keep trying to stop smoking- you will feel better when you do !

## 2017-05-16 NOTE — Assessment & Plan Note (Signed)
She uses oxygen mostly at night-discussed

## 2017-05-16 NOTE — Assessment & Plan Note (Addendum)
Chronic bronchitic pattern cough. Recent CT was reviewed. There is emphysema change but no acute process. She mostly uses nebulizer. Does not have maintenance prednisone or maintenance inhaler at this time and I'm trying to simplify medication since cost and compliance are difficult for her. Plan-after discussion I agreed to Cheratussin cough syrup

## 2017-05-16 NOTE — Progress Notes (Signed)
Patient ID: Megan Soto, female    DOB: February 04, 1961, 56 y.o.   MRN: [id number]  HPI  female smoker followed for COPD, complicated by anxiety, DM, GERD, musculoskeletal pain/pain clinic, hepatitis C Walk test on room air 10/11/16 -Room air resting saturation 88%, desaturated to 86% walking on room air, recovered on 3 L to 97% Office Spirometry 10/11/2016-severe obstructive airways disease-FVC 2.30/73%, FEV1 1.21/49%, ratio 0.52 --------------------------------------------------------------------------------------  10/11/2016-56 year old female smoker followed for COPD, hypoxic respiratory failure, complicated by anxiety, DM, GERD, musculoskeletal pain/pain clinic, hepatitis C O2 2 L/Advanced Continues to smoke Room air resting saturation 88%, desaturated to 86% walking on room air, recovered on 3 L to 97% FOLLOWS FOR: Pt continues to need O2 through out the day and night. Pt continues to have SOB and wheezing as well. Pt under more stress lately. Husband dx'd stage 4 lung cancer./ Hospice CXR 11/03/2015-NAD lisinopril Office Spirometry 10/11/2016-severe obstructive airways disease-FVC 2.30/73%, FEV1 1.21/49%, ratio 0.52  05/16/17- 56 year old female smoker followed for COPD, hypoxic respiratory failure, complicated by anxiety, DM, GERD, musculoskeletal pain/pain clinic, hepatitis C O2  2L sleep and prn/ Advanced COPD mixed type; DME [facility]. Pt continues to wear O2 24/7. Pt states she has had slight congestion and cold recently-was given ZPak here and then PCP gave additional abx to get through this. Since her husband died, daughter's family lives with her. She has cut down some on smoking. Xanax helps with this. Chronic cough continues to cause chest wall soreness diffusely. We discussed management of cough, emphasis on smoking cessation and reviewed current meds. We will reviewed CT scan imaging and pointed out emphysema change. CT chest 03/09/17 IMPRESSION: No acute cardiopulmonary disease. Aortic  Atherosclerosis (ICD10-I70.0) and Emphysema (ICD10-J43.9).  ROS-see HPI   + = positive Constitutional:    weight loss, night sweats, fevers, chills, fatigue, lassitude. HEENT:    headaches, difficulty swallowing, tooth/dental problems, sore throat,       sneezing, itching, ear ache, nasal congestion, post nasal drip, snoring CV:    +chest pain, orthopnea, PND, swelling in lower extremities, anasarca,                                               dizziness, palpitations Resp:  + shortness of breath with exertion or at rest.                +productive cough,   + non-productive cough, coughing up of blood.              change in color of mucus.  + wheezing.   Skin:    rash or lesions. GI:  No-   heartburn, indigestion, abdominal pain, nausea, vomiting, GU: dysuria, change in color of urine, no urgency or frequency.   flank pain. MS:  + joint pain, stiffness, decreased range of motion, back pain. Neuro-     nothing unusual Psych:  change in mood or affect.  +depression or anxiety.   memory loss.  OBJ- Physical Exam General- Alert, Oriented, Affect-pleasant/  anxious, Distress- none acute, + slender Skin- rash-none, lesions- none, excoriation- none Lymphadenopathy- none Head- atraumatic            Eyes- Gross vision intact, PERRLA, conjunctivae and secretions clear            Ears- Hearing, canals-normal  Nose- Clear, no-Septal dev, mucus, polyps, erosion, perforation             Throat- Mallampati II , mucosa clear , drainage- none, tonsils- atrophic, + missing teeth  Neck- flexible , trachea midline, no stridor , thyroid nl, carotid no bruit Chest - symmetrical excursion , unlabored           Heart/CV- RRR , no murmur , no gallop  , no rub, nl s1 s2                           - JVD- none , edema- none, stasis changes- none, varices- none           Lung-  Cough + light, wheeze + with forced expiration,  dullness-none, rub- none Abd-  Br/ Gen/ Rectal- Not done, not  indicated Extrem- cyanosis- none, clubbing, none, atrophy- none, strength- nl Neuro- grossly intact to observation

## 2017-05-16 NOTE — Assessment & Plan Note (Signed)
We discussed her smoking again emphasizing available support and the importance of quitting completely.

## 2017-06-13 ENCOUNTER — Telehealth: Payer: Self-pay | Admitting: Internal Medicine

## 2017-06-13 MED ORDER — PROMETHAZINE-CODEINE 6.25-10 MG/5ML PO SYRP: 5.0000 mL | ORAL_SOLUTION | Freq: Three times a day (TID) | ORAL | 0 refills | Status: DC | PRN

## 2017-06-13 MED ORDER — AMOXICILLIN 500 MG PO TABS: 500.0000 mg | ORAL_TABLET | Freq: Two times a day (BID) | ORAL | 0 refills | Status: DC

## 2017-06-13 NOTE — Telephone Encounter (Signed)
Offer amoxacillin 500 mg, # 20, 1 twice daily  Prometh codeine 200 ml    5 ml every 8 hours if needed for cough

## 2017-06-13 NOTE — Telephone Encounter (Signed)
Spoke with patient. She stated that she has been coughing up brown and green phlegm since 11/01. She has felt feverish off and on for the past few days. Cough is so strong that it causes her to felt nauseated. Since this morning, she has started to hurt in her joints.   She is requesting cough syrup and an antibiotic.   She wishes to use [facility].   CY, please advise. Thanks!

## 2017-06-13 NOTE — Telephone Encounter (Signed)
Spoke with patient. She is aware of CY's recs. Medications have been called into [facility].   Nothing else needed at time of call.

## 2017-07-07 ENCOUNTER — Other Ambulatory Visit: Payer: Self-pay | Admitting: Internal Medicine

## 2017-07-22 ENCOUNTER — Telehealth: Payer: Self-pay | Admitting: Internal Medicine

## 2017-07-22 MED ORDER — GUAIFENESIN-CODEINE 100-10 MG/5ML PO SYRP: 5.0000 mL | ORAL_SOLUTION | Freq: Three times a day (TID) | ORAL | 0 refills | Status: DC | PRN

## 2017-07-22 MED ORDER — AMOXICILLIN 500 MG PO TABS: 500.0000 mg | ORAL_TABLET | Freq: Two times a day (BID) | ORAL | 0 refills | Status: DC

## 2017-07-22 NOTE — Telephone Encounter (Signed)
Called spoke with patient who is requesting a refill on the Amoxicillin Cheratussin that CY sent in for her on 6911.5.18 C/o increased SOB, wheezing, prod cough with clear mucus, head congestion w/ green nasal drainage x2-3 days Denies any f/c/s, hemoptysis, chest pain

## 2017-07-22 NOTE — Telephone Encounter (Signed)
Called spoke with CY: okay to refill both as before.  Thank you  Called spoke with patient and informed her of the above Pt requests [facility] Rx's telephoned to pharmacist Shanda BumpsJessica  Nothing further needed; will sign off

## 2017-08-15 ENCOUNTER — Telehealth: Payer: Self-pay | Admitting: Internal Medicine

## 2017-08-15 NOTE — Telephone Encounter (Signed)
Pt requesting a refill of xanax 1mg  tablet. Med last refilled 05/16/17, #90tabs with 3 RF.  Pt last seen at office 05/16/17.  Dr. Maple HudsonYoung, please advise if you are okay for us to refill this med for pt.  Allergies  Allergen Reactions  . Aspirin Nausea And Vomiting     Current Outpatient Medications:  .  albuterol (PROVENTIL) (2.5 MG/3ML) 0.083% nebulizer solution, Take 3 mLs (2.5 mg total) by nebulization every 6 (six) hours as needed. DX 492.8, Disp: 360 mL, Rfl: 3 .  ALPRAZolam (XANAX) 1 MG tablet, Take 1 tablet (1 mg total) by mouth 3 (three) times daily as needed for anxiety., Disp: 90 tablet, Rfl: 3 .  amoxicillin (AMOXIL) 500 MG tablet, Take 1 tablet (500 mg total) by mouth 2 (two) times daily., Disp: 20 tablet, Rfl: 0 .  cetirizine (ZYRTEC) 10 MG tablet, Take 1 tablet (10 mg total) by mouth daily., Disp: 10 tablet, Rfl: 12 .  COMBIVENT RESPIMAT 20-100 MCG/ACT AERS respimat, INHALE 1 PUFF EVERY 6 HOURS AS NEEDED, Disp: 4 g, Rfl: 1 .  fluticasone (FLONASE) 50 MCG/ACT nasal spray, Place 2 sprays into both nostrils daily., Disp: 16 g, Rfl: 11 .  guaiFENesin-codeine (CHERATUSSIN AC) 100-10 MG/5ML syrup, Take 5 mLs by mouth 3 (three) times daily as needed for cough., Disp: 120 mL, Rfl: 0 .  lisinopril (PRINIVIL,ZESTRIL) 40 MG tablet, Take 40 mg by mouth daily., Disp: , Rfl:  .  metFORMIN (GLUCOPHAGE) 500 MG tablet, Take 500 mg by mouth 2 (two) times daily with a meal. , Disp: , Rfl:  .  Multiple Vitamin (MULTIVITAMIN WITH MINERALS) TABS, Take 1 tablet by mouth daily., Disp: , Rfl:  .  omeprazole (PRILOSEC) 20 MG capsule, TAKE ONE CAPSULE BY MOUTH DAILY, Disp: 30 capsule, Rfl: 5 .  PROAIR HFA 108 (90 Base) MCG/ACT inhaler, INHALE TWO PUFFS EVERY 4 TO 6 HOURS AS NEEDED (RESCUE INHALER), Disp: 17 g, Rfl: 6 .  promethazine-codeine (PHENERGAN WITH CODEINE) 6.25-10 MG/5ML syrup, Take 5 mLs every 8 (eight) hours as needed by mouth for cough., Disp: 200 mL, Rfl: 0 .  SPIRIVA HANDIHALER 18 MCG inhalation  capsule, PLACE 1 CAPSULE INTO INHALER AND INHALE ONCE DAILY AS DIRECTED, Disp: 30 capsule, Rfl: 1

## 2017-08-15 NOTE — Telephone Encounter (Signed)
CY please advise if you are approving #90 with 3 refills or #90 should last 4 months? I was unsure since the directions say as needed for anxiety. Please clarify.

## 2017-08-15 NOTE — Telephone Encounter (Signed)
Ok to refill this time. Please tell her this should last her at least 4 months.

## 2017-08-16 MED ORDER — ALPRAZOLAM 1 MG PO TABS: 1.0000 mg | ORAL_TABLET | Freq: Three times a day (TID) | ORAL | 3 refills | Status: DC | PRN

## 2017-08-16 NOTE — Telephone Encounter (Signed)
I responded to a request for alprzolam yesterday

## 2017-08-16 NOTE — Telephone Encounter (Signed)
Rx called into the pharmacy and nothing further is needed.

## 2017-08-16 NOTE — Telephone Encounter (Signed)
Per chart, last refill sent in on 05/16/17 #90 with 3 refills. [facility] rx [facility]/12/17- this was the last refill from previous rx.    KW please advise if this has been already responded to via paper request, as there is no other request in pt's chart.  Thanks!

## 2017-08-16 NOTE — Telephone Encounter (Signed)
I am saying to refill as # 90, 1 three times daily as needed, with 3 refills. That's a total of 4 months, so this prescription needs to last her for 4 months.

## 2017-09-01 ENCOUNTER — Other Ambulatory Visit: Payer: Self-pay | Admitting: Internal Medicine

## 2017-09-01 ENCOUNTER — Telehealth: Payer: Self-pay | Admitting: Internal Medicine

## 2017-09-01 NOTE — Telephone Encounter (Signed)
Per chart these were refilled by [place] today at 3:15 to preferred pharmacy.   lmtcb X1 for pt to make aware.

## 2017-09-02 NOTE — Telephone Encounter (Signed)
Spoke with pt and advised rx sent to pharmacy. Nothing further is needed.   

## 2017-09-13 ENCOUNTER — Telehealth: Payer: Self-pay | Admitting: Internal Medicine

## 2017-09-13 NOTE — Telephone Encounter (Signed)
lmtcb x1 for pt. 

## 2017-09-14 ENCOUNTER — Other Ambulatory Visit: Payer: Self-pay | Admitting: Internal Medicine

## 2017-09-14 NOTE — Telephone Encounter (Signed)
Please advise on refill. Pt last seen 05-2017 and cancelled appointment for 09-15-17. Thanks.

## 2017-09-14 NOTE — Telephone Encounter (Signed)
Spoke with pt, she wants to see if CY will send her records over to her pulmonologist. [facility] [phone].   Pt also wanted to cancel her appointment for tomorrow with CY. Appt cancelled and she stated she would call back to make another one. Nothing further is needed.

## 2017-09-14 NOTE — Telephone Encounter (Signed)
Ok to refill 

## 2017-09-15 ENCOUNTER — Ambulatory Visit: Payer: Medicaid Other | Admitting: Internal Medicine

## 2017-09-21 ENCOUNTER — Telehealth: Payer: Self-pay | Admitting: Internal Medicine

## 2017-09-21 NOTE — Telephone Encounter (Signed)
Called and spoke with pt who states she is wanting a Rx of Norco (the pill form not liquid due to medicaid not paying for the liquid form)  Pt states she needs #20 tablets of Norco 10mg  until she is able to get into the pain clinic for an appt.  Dr. Maple HudsonYoung, please advise on this for pt.  Thanks!  Allergies  Allergen Reactions  . Aspirin Nausea And Vomiting     Current Outpatient Medications:  .  albuterol (PROVENTIL) (2.5 MG/3ML) 0.083% nebulizer solution, Take 3 mLs (2.5 mg total) by nebulization every 6 (six) hours as needed. DX 492.8, Disp: 360 mL, Rfl: 3 .  ALPRAZolam (XANAX) 1 MG tablet, Take 1 tablet (1 mg total) by mouth 3 (three) times daily as needed for anxiety., Disp: 90 tablet, Rfl: 3 .  amoxicillin (AMOXIL) 500 MG tablet, TAKE 1 TABLET BY MOUTH TWICE DAILY FOR 10 DAYS, Disp: 20 tablet, Rfl: 0 .  cetirizine (ZYRTEC) 10 MG tablet, Take 1 tablet (10 mg total) by mouth daily., Disp: 10 tablet, Rfl: 12 .  COMBIVENT RESPIMAT 20-100 MCG/ACT AERS respimat, INHALE 1 PUFF EVERY 6 HOURS AS NEEDED, Disp: 4 g, Rfl: 1 .  fluticasone (FLONASE) 50 MCG/ACT nasal spray, Place 2 sprays into both nostrils daily., Disp: 16 g, Rfl: 11 .  guaiFENesin-codeine (CHERATUSSIN AC) 100-10 MG/5ML syrup, Take 5 mLs by mouth 3 (three) times daily as needed for cough., Disp: 120 mL, Rfl: 0 .  lisinopril (PRINIVIL,ZESTRIL) 40 MG tablet, Take 40 mg by mouth daily., Disp: , Rfl:  .  metFORMIN (GLUCOPHAGE) 500 MG tablet, Take 500 mg by mouth 2 (two) times daily with a meal. , Disp: , Rfl:  .  Multiple Vitamin (MULTIVITAMIN WITH MINERALS) TABS, Take 1 tablet by mouth daily., Disp: , Rfl:  .  omeprazole (PRILOSEC) 20 MG capsule, TAKE ONE CAPSULE BY MOUTH DAILY, Disp: 30 capsule, Rfl: 5 .  PROAIR HFA 108 (90 Base) MCG/ACT inhaler, INHALE 2 PUFFS INTO THE LUNGS EVERY 4 TO 6 HOURS AS NEEDED (RESCUE INHALER), Disp: 17 g, Rfl: 4 .  promethazine-codeine (PHENERGAN WITH CODEINE) 6.25-10 MG/5ML syrup, Take 5 mLs every 8  (eight) hours as needed by mouth for cough., Disp: 200 mL, Rfl: 0 .  SPIRIVA HANDIHALER 18 MCG inhalation capsule, PLACE 1 CAPSULE INTO INHALER AND INHALE ONCE DAILY AS DIRECTED, Disp: 30 capsule, Rfl: 1

## 2017-09-21 NOTE — Telephone Encounter (Signed)
lmtcb x1 for pt. 

## 2017-09-21 NOTE — Telephone Encounter (Signed)
Called pt letting her know we are not sending a script of Norco in to her pharmacy.  Pt expressed understanding. Nothing further needed at this time.

## 2017-09-21 NOTE — Telephone Encounter (Signed)
Sorry -No

## 2017-09-21 NOTE — Telephone Encounter (Signed)
Patient returning call - she can be reached at [phone]-pr

## 2017-09-28 ENCOUNTER — Telehealth: Payer: Self-pay | Admitting: Internal Medicine

## 2017-09-28 ENCOUNTER — Other Ambulatory Visit: Payer: Self-pay

## 2017-09-28 MED ORDER — ALBUTEROL SULFATE HFA 108 (90 BASE) MCG/ACT IN AERS: INHALATION_SPRAY | RESPIRATORY_TRACT | 12 refills | Status: DC

## 2017-09-28 NOTE — Telephone Encounter (Signed)
[facility] for [employer] ProductsProair.  Per CY okay to reorder for 2 inhalers with 11 refills per pt request.  Rx has been sent to preferred pharmacy. Nothing further is needed.

## 2017-09-28 NOTE — Telephone Encounter (Signed)
Pt is aware that 2 inhalers of proair has been sent to preferred pharmacy. Nothing further is needed.

## 2017-10-24 ENCOUNTER — Ambulatory Visit: Payer: Medicaid Other | Admitting: Internal Medicine

## 2017-11-09 ENCOUNTER — Other Ambulatory Visit: Payer: Self-pay | Admitting: Internal Medicine

## 2017-11-11 ENCOUNTER — Other Ambulatory Visit: Payer: Self-pay | Admitting: Internal Medicine

## 2017-11-14 ENCOUNTER — Telehealth: Payer: Self-pay | Admitting: Internal Medicine

## 2017-11-14 MED ORDER — IPRATROPIUM-ALBUTEROL 20-100 MCG/ACT IN AERS: INHALATION_SPRAY | RESPIRATORY_TRACT | 1 refills | Status: DC

## 2017-11-14 NOTE — Telephone Encounter (Signed)
Called and spoke with patient, advised that medication has been sent in.

## 2017-11-21 ENCOUNTER — Telehealth: Payer: Self-pay | Admitting: Internal Medicine

## 2017-11-21 MED ORDER — PREDNISONE 10 MG PO TABS: ORAL_TABLET | ORAL | 0 refills | Status: DC

## 2017-11-21 MED ORDER — AMOXICILLIN 500 MG PO TABS: 500.0000 mg | ORAL_TABLET | Freq: Two times a day (BID) | ORAL | 0 refills | Status: AC

## 2017-11-21 NOTE — Telephone Encounter (Signed)
Spoke with pt. States that she is not feeling well. Reports cough and SOB. Cough is producing yellow mucus. Denies chest tightness, wheezing or fever. Thinks this is coming from all of the pollen. Symptoms started 1 week ago. She would like to have an antibiotic and prednisone taper.  Dr. Maple HudsonYoung - please advise. Thanks.  Allergies  Allergen Reactions  . Aspirin Nausea And Vomiting   Current Outpatient Medications on File Prior to Visit  Medication Sig Dispense Refill  . albuterol (PROAIR HFA) 108 (90 Base) MCG/ACT inhaler INHALE 2 PUFFS INTO THE LUNGS EVERY 4 TO 6 HOURS AS NEEDED (RESCUE INHALER) 2 Inhaler 12  . albuterol (PROVENTIL) (2.5 MG/3ML) 0.083% nebulizer solution Take 3 mLs (2.5 mg total) by nebulization every 6 (six) hours as needed. DX 492.8 360 mL 3  . ALPRAZolam (XANAX) 1 MG tablet Take 1 tablet (1 mg total) by mouth 3 (three) times daily as needed for anxiety. 90 tablet 3  . amoxicillin (AMOXIL) 500 MG tablet TAKE 1 TABLET BY MOUTH TWICE DAILY FOR 10 DAYS 20 tablet 0  . cetirizine (ZYRTEC) 10 MG tablet Take 1 tablet (10 mg total) by mouth daily. 10 tablet 12  . COMBIVENT RESPIMAT 20-100 MCG/ACT AERS respimat INHALE 1 PUFF EVERY 6 HOURS AS NEEDED 4 g 1  . fluticasone (FLONASE) 50 MCG/ACT nasal spray Place 2 sprays into both nostrils daily. 16 g 11  . guaiFENesin-codeine (CHERATUSSIN AC) 100-10 MG/5ML syrup Take 5 mLs by mouth 3 (three) times daily as needed for cough. 120 mL 0  . lisinopril (PRINIVIL,ZESTRIL) 40 MG tablet Take 40 mg by mouth daily.    . metFORMIN (GLUCOPHAGE) 500 MG tablet Take 500 mg by mouth 2 (two) times daily with a meal.     . Multiple Vitamin (MULTIVITAMIN WITH MINERALS) TABS Take 1 tablet by mouth daily.    Marland Kitchen. omeprazole (PRILOSEC) 20 MG capsule TAKE 1 CAPSULE BY MOUTH DAILY 30 capsule 1  . promethazine-codeine (PHENERGAN WITH CODEINE) 6.25-10 MG/5ML syrup Take 5 mLs every 8 (eight) hours as needed by mouth for cough. 200 mL 0  . SPIRIVA HANDIHALER 18  MCG inhalation capsule PLACE 1 CAPSULE INTO INHALER AND INHALE ONCE DAILY AS DIRECTED 30 capsule 1   No current facility-administered medications on file prior to visit.

## 2017-11-21 NOTE — Telephone Encounter (Signed)
Spoke with pt. She is aware of Dr. Roxy CedarYoung's recommendations. Rxs have been sent in. Nothing further was needed.

## 2017-11-21 NOTE — Telephone Encounter (Signed)
Offer amoxacillin 500 mg, # 14, 1 twice daily            Prednisone 10 mg, # 20, 4 X 2 DAYS, 3 X 2 DAYS, 2 X 2 DAYS, 1 X 2 DAYS  

## 2017-11-22 ENCOUNTER — Ambulatory Visit: Payer: Medicaid Other | Admitting: Family Medicine

## 2017-11-22 ENCOUNTER — Other Ambulatory Visit: Payer: Self-pay

## 2017-11-22 ENCOUNTER — Encounter: Payer: Self-pay | Admitting: Family Medicine

## 2017-11-22 VITALS — BP 130/80 | HR 68 | Temp 98.5°F | Ht 62.0 in | Wt 106.0 lb

## 2017-11-22 DIAGNOSIS — M545 Low back pain, unspecified: Secondary | ICD-10-CM

## 2017-11-22 DIAGNOSIS — G8929 Other chronic pain: Secondary | ICD-10-CM | POA: Diagnosis not present

## 2017-11-22 DIAGNOSIS — Z720 Tobacco use: Secondary | ICD-10-CM

## 2017-11-22 DIAGNOSIS — J449 Chronic obstructive pulmonary disease, unspecified: Secondary | ICD-10-CM | POA: Diagnosis not present

## 2017-11-22 DIAGNOSIS — I10 Essential (primary) hypertension: Secondary | ICD-10-CM | POA: Diagnosis not present

## 2017-11-22 DIAGNOSIS — Z7689 Persons encountering health services in other specified circumstances: Secondary | ICD-10-CM

## 2017-11-22 DIAGNOSIS — B1710 Acute hepatitis C without hepatic coma: Secondary | ICD-10-CM

## 2017-11-22 DIAGNOSIS — E119 Type 2 diabetes mellitus without complications: Secondary | ICD-10-CM | POA: Diagnosis present

## 2017-11-22 LAB — POCT GLYCOSYLATED HEMOGLOBIN (HGB A1C): Hemoglobin A1C: 5.2

## 2017-11-22 MED ORDER — IBUPROFEN 600 MG PO TABS: 600.0000 mg | ORAL_TABLET | Freq: Three times a day (TID) | ORAL | 0 refills | Status: AC | PRN

## 2017-11-22 NOTE — Patient Instructions (Signed)
It was a pleasure to see you today! Thank you for choosing [facility] for your primary care. Megan Soto was seen for to establish care. Come back to the clinic in 2 weeks to talk about your chronic back pain and other healthcare maintenance. Best,  Thomes DinningBrad Thompson, MD, MS FAMILY MEDICINE RESIDENT - PGY1 11/22/2017 4:24 PM

## 2017-11-23 LAB — COMPREHENSIVE METABOLIC PANEL
ALT: 18 IU/L (ref 0–32)
AST: 22 IU/L (ref 0–40)
Albumin/Globulin Ratio: 1.4 (ref 1.2–2.2)
Albumin: 4.4 g/dL (ref 3.5–5.5)
Alkaline Phosphatase: 96 IU/L (ref 39–117)
BUN/Creatinine Ratio: 11 (ref 9–23)
BUN: 11 mg/dL (ref 6–24)
Bilirubin Total: 0.3 mg/dL (ref 0.0–1.2)
CO2: 27 mmol/L (ref 20–29)
Calcium: 9.6 mg/dL (ref 8.7–10.2)
Chloride: 101 mmol/L (ref 96–106)
Creatinine, Ser: 0.99 mg/dL (ref 0.57–1.00)
GFR calc Af Amer: 73 mL/min/{1.73_m2} (ref >59)
GFR calc non Af Amer: 63 mL/min/{1.73_m2} (ref >59)
Globulin, Total: 3.1 g/dL (ref 1.5–4.5)
Glucose: 103 mg/dL — ABNORMAL HIGH (ref 65–99)
Potassium: 4.1 mmol/L (ref 3.5–5.2)
Sodium: 143 mmol/L (ref 134–144)
Total Protein: 7.5 g/dL (ref 6.0–8.5)

## 2017-11-23 LAB — CBC WITH DIFFERENTIAL/PLATELET
Basophils Absolute: 0 10*3/uL (ref 0.0–0.2)
Basos: 0 %
EOS (ABSOLUTE): 0.1 10*3/uL (ref 0.0–0.4)
Eos: 1 %
Hematocrit: 39.7 % (ref 34.0–46.6)
Hemoglobin: 13.4 g/dL (ref 11.1–15.9)
Immature Grans (Abs): 0 10*3/uL (ref 0.0–0.1)
Immature Granulocytes: 0 %
Lymphocytes Absolute: 3.1 10*3/uL (ref 0.7–3.1)
Lymphs: 26 %
MCH: 29.5 pg (ref 26.6–33.0)
MCHC: 33.8 g/dL (ref 31.5–35.7)
MCV: 87 fL (ref 79–97)
Monocytes Absolute: 1.4 10*3/uL — ABNORMAL HIGH (ref 0.1–0.9)
Monocytes: 12 %
Neutrophils Absolute: 7.1 10*3/uL — ABNORMAL HIGH (ref 1.4–7.0)
Neutrophils: 61 %
Platelets: 394 10*3/uL — ABNORMAL HIGH (ref 150–379)
RBC: 4.54 x10E6/uL (ref 3.77–5.28)
RDW: 13 % (ref 12.3–15.4)
WBC: 11.8 10*3/uL — ABNORMAL HIGH (ref 3.4–10.8)

## 2017-11-23 LAB — LIPID PANEL
Chol/HDL Ratio: 2.5 ratio (ref 0.0–4.4)
Cholesterol, Total: 108 mg/dL (ref 100–199)
HDL: 43 mg/dL (ref >39)
LDL Calculated: 35 mg/dL (ref 0–99)
Triglycerides: 148 mg/dL (ref 0–149)
VLDL Cholesterol Cal: 30 mg/dL (ref 5–40)

## 2017-11-25 NOTE — Assessment & Plan Note (Signed)
Blood pressure is well controlled on lisinopril.  Upon further chart review seems that this agent was recommended to be avoided due to risk of angioedema and difficulty with intubation.  Patient seems to have been on this agent for a long time does not show any signs of symptoms associated with that complication.  I am unsure if the patient has had that discussion.  At next appointment we will discuss if she wants to continue on this agent given the risk and benefit and will consider switching to another option.  We will also obtain cMP to evaluate renal function.

## 2017-11-25 NOTE — Assessment & Plan Note (Signed)
Patient does not have any abdominal exam findings consistent with cirrhosis.  Labs on 2017 showed that she was positive for hepatitis c type Ib.  Also has history of hepatitis A positive antibodies.  We will get a CMP to evaluate LFTs.  Consider establishing with ID at future visits

## 2017-11-25 NOTE — Assessment & Plan Note (Signed)
Currently using Chantix to reduce smoking.  Patient denied wanting any help from the quit line or reach out from pharmacy for help with this.  We will continue Chantix for now.  And follow smoking habits for and forward.
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
## 2017-11-28 DIAGNOSIS — M545 Low back pain, unspecified: Secondary | ICD-10-CM | POA: Insufficient documentation

## 2017-11-28 DIAGNOSIS — G8929 Other chronic pain: Secondary | ICD-10-CM | POA: Insufficient documentation

## 2017-11-28 DIAGNOSIS — E119 Type 2 diabetes mellitus without complications: Secondary | ICD-10-CM | POA: Insufficient documentation

## 2017-11-28 NOTE — Assessment & Plan Note (Signed)
Patient has chronic lower back pain without red flag symptoms. Not currently taking anything for back pain. Previous imaging is suggestive of degenerative lumbar disc disease. Will trial ibuprofen for now. Patient to return in future visits to discuss further management.

## 2017-11-28 NOTE — Assessment & Plan Note (Signed)
Cont metform. Obtain lipid panel. Consider starting statin in future visit. Have patient return in 3 mo to check A1C. No need to continue glucose monitoring given A1C at goal and not on insulin.

## 2017-12-06 ENCOUNTER — Other Ambulatory Visit: Payer: Self-pay | Admitting: Internal Medicine

## 2017-12-08 ENCOUNTER — Ambulatory Visit: Payer: Medicaid Other | Admitting: Family Medicine

## 2017-12-08 ENCOUNTER — Other Ambulatory Visit: Payer: Self-pay

## 2017-12-08 ENCOUNTER — Encounter: Payer: Self-pay | Admitting: Family Medicine

## 2017-12-08 VITALS — BP 123/70 | HR 83 | Temp 97.7°F | Wt 103.0 lb

## 2017-12-08 DIAGNOSIS — M545 Low back pain, unspecified: Secondary | ICD-10-CM

## 2017-12-08 DIAGNOSIS — G8929 Other chronic pain: Secondary | ICD-10-CM

## 2017-12-08 DIAGNOSIS — R109 Unspecified abdominal pain: Secondary | ICD-10-CM

## 2017-12-08 MED ORDER — VENLAFAXINE HCL ER 75 MG PO CP24: 75.0000 mg | ORAL_CAPSULE | Freq: Every day | ORAL | 0 refills | Status: DC

## 2017-12-08 MED ORDER — OMEPRAZOLE 40 MG PO CPDR
40.0000 mg | DELAYED_RELEASE_CAPSULE | Freq: Every day | ORAL | 3 refills | Status: DC
Start: 2017-12-08 — End: 2018-06-09

## 2017-12-08 NOTE — Progress Notes (Signed)
    Subjective:  Megan Soto is a 57 y.o. female who presents to the [facility] today with a chief complaint of chronic lower back pain.   HPI:  Chronic BACK PAIN + pain in shoulders and and legs. Says she she also has fibromyalgia. Asking for oxycodone 10 mg xqid. Says this has worked for her is the past. 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 Has been taking family and friends oxycodone 15 mg multiple times as needed for pain. Last time today. Claims to have tried tramadol, hydrocone, Cymbala, gabapentin, lyrica. She has been to multiple pain clinics before without benefit. She has had multple "back injections" for pain without relief. She has been taking ibuprofen 600 mg for pain, 4-5 or more a day. "Eats them like candy" Says she has had worsening abdominal pain over that interval. Endorses normal urine output. Denies vomiting blood or blood in stools, dark tarry stools. Denies any worsening with back pain, night sweats, fevers chills, loss of bowel/bladder fxn, saddle anesthesia. Pt denies IV or other illict drug use, or alcohol use.  Peg: 10,10,10.    ROS see HPI PMH: Chronic COPD secondary to smoking. Currently working on [employer] w/ Chantix.   Objective:  Physical Exam: BP 123/70   Pulse 83   Temp 97.7 F (36.5 C) (Oral)   Wt 103 lb (46.7 kg)   SpO2 97%   BMI 18.84 kg/m   Gen: NAD, resting comfortably CV: RRR with no murmurs appreciated Pulm: NWOB, bronchovesicular  GI: Normal bowel sounds present. Soft, Nontender, Nondistended. MSK: no edema, cyanosis, or clubbing noted Skin: warm, dry Neuro: grossly normal, moves all extremities Psych: Normal affect and thought content  No results found for this or any previous visit (from the past 72 hour(s)).   Assessment/Plan:  Chronic low back pain without sciatica Patient asking for high doses of opioids for chronic back pain. Pt is not currently prescribed anything. Endorses using  multiple doses from friend. She claims to have tried multiple therapies for chronic pain as listed in HPI. Pt has been to multiple pain clinics w/o relief. Discussed risk of chronic opioid use and addictive properties. Given pt claim of such high need without improvement w/ ibuprofen. Will trial effexor for chronic pain, referral to pain clinic placed today. Pt to take tylenol prn for pain, and stop ibuprofen due to abdominal pain (see below). Advised not to take more than 2000 mg total in 24 period due to hepatitis C. Pt to return in 1 month. If she can provide a urien     Lab Orders     Fecal occult blood, imunochemical([facility])     CBC with Differential     Comprehensive metabolic panel  Meds ordered this encounter  Medications  . venlafaxine XR (EFFEXOR-XR) 75 MG 24 hr capsule    Sig: Take 1 capsule (75 mg total) by mouth daily with breakfast.    Dispense:  30 capsule    Refill:  0  . omeprazole (PRILOSEC) 40 MG capsule    Sig: Take 1 capsule (40 mg total) by mouth daily.    Dispense:  30 capsule    Refill:  3    Thomes Dinning, MD, MS FAMILY MEDICINE RESIDENT - PGY1 12/08/2017 4:20 PM

## 2017-12-08 NOTE — Patient Instructions (Addendum)
It was a pleasure to see you today! Thank you for choosing [facility] for your primary care. Megan Soto was seen for Chronic Back Pain. Please stop taking ibuprofen, given your abdominal symptoms. I am concerned you might have an ulcer. We increased your omeprazole to 40 mg a day. We will start you on Effexor for pain. You can take over the counter tylenol for pain as well. Do not take more than 2000 mg/ day given your history of Hepatitis C. I placed a referral to Pain Clinic, you will get a call regarding timing. Come back to the clinic in one month to do a UDS and discuss your ongoing pain.   Best,  Thomes Dinning, MD, MS FAMILY MEDICINE RESIDENT - PGY1 12/08/2017 3:16 PM

## 2017-12-08 NOTE — Assessment & Plan Note (Signed)
Patient asking for high doses of opioids for chronic back pain. Pt is not currently prescribed anything. Endorses using multiple doses from friend. She claims to have tried multiple therapies for chronic pain as listed in HPI. Pt has been to multiple pain clinics w/o relief. Discussed risk of chronic opioid use and addictive properties. Given pt claim of such high need without improvement w/ ibuprofen. Will trial effexor for chronic pain, referral to pain clinic placed today. Pt to take tylenol prn for pain, and stop ibuprofen due to abdominal pain (see below). Advised not to take more than 2000 mg total in 24 period due to hepatitis C. Pt to return in 1 month. If she can provide a urien

## 2017-12-09 LAB — CBC WITH DIFFERENTIAL/PLATELET
Basophils Absolute: 0 10*3/uL (ref 0.0–0.2)
Basos: 0 %
EOS (ABSOLUTE): 0.1 10*3/uL (ref 0.0–0.4)
Eos: 1 %
Hematocrit: 41.4 % (ref 34.0–46.6)
Hemoglobin: 14.2 g/dL (ref 11.1–15.9)
Immature Grans (Abs): 0 10*3/uL (ref 0.0–0.1)
Immature Granulocytes: 0 %
Lymphocytes Absolute: 4 10*3/uL — ABNORMAL HIGH (ref 0.7–3.1)
Lymphs: 33 %
MCH: 29.6 pg (ref 26.6–33.0)
MCHC: 34.3 g/dL (ref 31.5–35.7)
MCV: 86 fL (ref 79–97)
Monocytes Absolute: 1.1 10*3/uL — ABNORMAL HIGH (ref 0.1–0.9)
Monocytes: 10 %
Neutrophils Absolute: 6.6 10*3/uL (ref 1.4–7.0)
Neutrophils: 56 %
Platelets: 300 10*3/uL (ref 150–379)
RBC: 4.8 x10E6/uL (ref 3.77–5.28)
RDW: 14.1 % (ref 12.3–15.4)
WBC: 11.8 10*3/uL — ABNORMAL HIGH (ref 3.4–10.8)

## 2017-12-09 LAB — COMPREHENSIVE METABOLIC PANEL
ALT: 35 IU/L — ABNORMAL HIGH (ref 0–32)
AST: 26 IU/L (ref 0–40)
Albumin/Globulin Ratio: 1.6 (ref 1.2–2.2)
Albumin: 4.4 g/dL (ref 3.5–5.5)
Alkaline Phosphatase: 89 IU/L (ref 39–117)
BUN/Creatinine Ratio: 14 (ref 9–23)
BUN: 11 mg/dL (ref 6–24)
Bilirubin Total: 0.3 mg/dL (ref 0.0–1.2)
CO2: 23 mmol/L (ref 20–29)
Calcium: 9.2 mg/dL (ref 8.7–10.2)
Chloride: 94 mmol/L — ABNORMAL LOW (ref 96–106)
Creatinine, Ser: 0.77 mg/dL (ref 0.57–1.00)
GFR calc Af Amer: 98 mL/min/{1.73_m2} (ref >59)
GFR calc non Af Amer: 85 mL/min/{1.73_m2} (ref >59)
Globulin, Total: 2.7 g/dL (ref 1.5–4.5)
Glucose: 106 mg/dL — ABNORMAL HIGH (ref 65–99)
Potassium: 3.8 mmol/L (ref 3.5–5.2)
Sodium: 136 mmol/L (ref 134–144)
Total Protein: 7.1 g/dL (ref 6.0–8.5)

## 2017-12-12 ENCOUNTER — Encounter: Payer: Self-pay | Admitting: *Deleted

## 2017-12-26 ENCOUNTER — Telehealth: Payer: Self-pay | Admitting: Internal Medicine

## 2017-12-26 MED ORDER — ALPRAZOLAM 1 MG PO TABS: 1.0000 mg | ORAL_TABLET | Freq: Three times a day (TID) | ORAL | 0 refills | Status: DC | PRN

## 2017-12-26 NOTE — Telephone Encounter (Signed)
Ok to refill 

## 2017-12-26 NOTE — Telephone Encounter (Signed)
Rx refilled  Spoke with the pt and made aware this was done

## 2017-12-26 NOTE — Telephone Encounter (Signed)
Called and spoke to pt. Pt is requesting refill on Xanax . Last refilled 08/16/17 #90 with 3 refills. Pt last seen 05/26/17 with pending apt for 01/18/18. Preferred pharmacy [facility].    CY please advise on refill. Thanks  Current Outpatient Medications on File Prior to Visit  Medication Sig Dispense Refill  . albuterol (PROAIR HFA) 108 (90 Base) MCG/ACT inhaler INHALE 2 PUFFS INTO THE LUNGS EVERY 4 TO 6 HOURS AS NEEDED (RESCUE INHALER) 2 Inhaler 12  . albuterol (PROVENTIL) (2.5 MG/3ML) 0.083% nebulizer solution Take 3 mLs (2.5 mg total) by nebulization every 6 (six) hours as needed. DX 492.8 360 mL 3  . ALPRAZolam (XANAX) 1 MG tablet Take 1 tablet (1 mg total) by mouth 3 (three) times daily as needed for anxiety. 90 tablet 3  . cetirizine (ZYRTEC) 10 MG tablet Take 1 tablet (10 mg total) by mouth daily. 10 tablet 12  . COMBIVENT RESPIMAT 20-100 MCG/ACT AERS respimat INHALE 1 PUFF EVERY 6 HOURS AS NEEDED 4 g 1  . fluticasone (FLONASE) 50 MCG/ACT nasal spray Place 2 sprays into both nostrils daily. 16 g 11  . guaiFENesin-codeine (CHERATUSSIN AC) 100-10 MG/5ML syrup Take 5 mLs by mouth 3 (three) times daily as needed for cough. 120 mL 0  . lisinopril (PRINIVIL,ZESTRIL) 40 MG tablet Take 40 mg by mouth daily.    . metFORMIN (GLUCOPHAGE) 500 MG tablet Take 500 mg by mouth 2 (two) times daily with a meal.     . Multiple Vitamin (MULTIVITAMIN WITH MINERALS) TABS Take 1 tablet by mouth daily.    Marland Kitchen omeprazole (PRILOSEC) 40 MG capsule Take 1 capsule (40 mg total) by mouth daily. 30 capsule 3  . predniSONE (DELTASONE) 10 MG tablet 4 X 2 DAYS, 3 X 2 DAYS, 2 X 2 DAYS, 1 X 2 DAYS 20 tablet 0  . promethazine-codeine (PHENERGAN WITH CODEINE) 6.25-10 MG/5ML syrup Take 5 mLs every 8 (eight) hours as needed by mouth for cough. 200 mL 0  . SPIRIVA HANDIHALER 18 MCG inhalation capsule PLACE 1 CAPSULE INTO INHALER AND INHALE ONCE DAILY AS DIRECTED 30 capsule 0  . venlafaxine XR (EFFEXOR-XR) 75 MG 24 hr  capsule Take 1 capsule (75 mg total) by mouth daily with breakfast. 30 capsule 0   No current facility-administered medications on file prior to visit.     Allergies  Allergen Reactions  . Aspirin Nausea And Vomiting

## 2017-12-30 ENCOUNTER — Telehealth: Payer: Self-pay

## 2017-12-30 ENCOUNTER — Telehealth: Payer: Self-pay | Admitting: Internal Medicine

## 2017-12-30 MED ORDER — ALBUTEROL SULFATE HFA 108 (90 BASE) MCG/ACT IN AERS: INHALATION_SPRAY | RESPIRATORY_TRACT | 12 refills | Status: DC

## 2017-12-30 NOTE — Telephone Encounter (Signed)
Patient left second message stating she hasn't heard from pain clinic yet and needs help with her pain, requests pain medication.  Call back is [phone]  Ples Specter, RN [facility] RN)

## 2017-12-30 NOTE — Telephone Encounter (Signed)
Proair refill sent to preferred pharmacy.  Patient is aware.  Nothing further needed at this time.

## 2017-12-30 NOTE — Telephone Encounter (Signed)
Pt called nurse line, wants to speak to MD only- "asap not 2-3 days from now" but would not say what it is in reference to. Call back number [phone] Shawna Orleans, RN

## 2018-01-02 ENCOUNTER — Other Ambulatory Visit: Payer: Self-pay | Admitting: Family Medicine

## 2018-01-03 ENCOUNTER — Encounter: Payer: Self-pay | Admitting: Emergency Medicine

## 2018-01-03 ENCOUNTER — Emergency Department
Admission: EM | Admit: 2018-01-03 | Discharge: 2018-01-03 | Disposition: A | Discharge status: Home / Self Care | Payer: Medicaid Other | Attending: Emergency Medicine | Admitting: Emergency Medicine

## 2018-01-03 DIAGNOSIS — F17210 Nicotine dependence, cigarettes, uncomplicated: Secondary | ICD-10-CM | POA: Insufficient documentation

## 2018-01-03 DIAGNOSIS — Z79899 Other long term (current) drug therapy: Secondary | ICD-10-CM | POA: Insufficient documentation

## 2018-01-03 DIAGNOSIS — E119 Type 2 diabetes mellitus without complications: Secondary | ICD-10-CM | POA: Insufficient documentation

## 2018-01-03 DIAGNOSIS — J340 Abscess, furuncle and carbuncle of nose: Secondary | ICD-10-CM | POA: Diagnosis not present

## 2018-01-03 DIAGNOSIS — Z7984 Long term (current) use of oral hypoglycemic drugs: Secondary | ICD-10-CM | POA: Insufficient documentation

## 2018-01-03 DIAGNOSIS — R0602 Shortness of breath: Secondary | ICD-10-CM | POA: Diagnosis present

## 2018-01-03 DIAGNOSIS — R000 Tachycardia, unspecified: Secondary | ICD-10-CM | POA: Diagnosis not present

## 2018-01-03 DIAGNOSIS — J449 Chronic obstructive pulmonary disease, unspecified: Secondary | ICD-10-CM | POA: Diagnosis not present

## 2018-01-03 DIAGNOSIS — I10 Essential (primary) hypertension: Secondary | ICD-10-CM | POA: Diagnosis not present

## 2018-01-03 LAB — CBC
HCT: 41.6 % (ref 35.0–47.0)
Hemoglobin: 14.2 g/dL (ref 12.0–16.0)
MCH: 29.7 pg (ref 26.0–34.0)
MCHC: 34.1 g/dL (ref 32.0–36.0)
MCV: 87.1 fL (ref 80.0–100.0)
Platelets: 250 10*3/uL (ref 150–440)
RBC: 4.78 MIL/uL (ref 3.80–5.20)
RDW: 14.5 % (ref 11.5–14.5)
WBC: 11.5 10*3/uL — ABNORMAL HIGH (ref 3.6–11.0)

## 2018-01-03 LAB — BASIC METABOLIC PANEL
Anion gap: 12 (ref 5–15)
BUN: 11 mg/dL (ref 6–20)
CO2: 28 mmol/L (ref 22–32)
Calcium: 9.9 mg/dL (ref 8.9–10.3)
Chloride: 96 mmol/L — ABNORMAL LOW (ref 101–111)
Creatinine, Ser: 0.88 mg/dL (ref 0.44–1.00)
GFR calc Af Amer: >60 mL/min (ref >60)
GFR calc non Af Amer: >60 mL/min (ref >60)
Glucose, Bld: 123 mg/dL — ABNORMAL HIGH (ref 65–99)
Potassium: 3.9 mmol/L (ref 3.5–5.1)
Sodium: 136 mmol/L (ref 135–145)

## 2018-01-03 MED ORDER — ALBUTEROL SULFATE (2.5 MG/3ML) 0.083% IN NEBU: 2.5000 mg | INHALATION_SOLUTION | Freq: Four times a day (QID) | RESPIRATORY_TRACT | 12 refills | Status: DC | PRN

## 2018-01-03 MED ORDER — ALBUTEROL SULFATE (2.5 MG/3ML) 0.083% IN NEBU
5.0000 mg | INHALATION_SOLUTION | Freq: Once | RESPIRATORY_TRACT | Status: AC
  Administered 2018-01-03: 5 mg via RESPIRATORY_TRACT
  Filled 2018-01-03: qty 6

## 2018-01-03 MED ORDER — PREDNISONE 10 MG PO TABS: ORAL_TABLET | ORAL | 0 refills | Status: DC

## 2018-01-03 MED ORDER — OXYCODONE-ACETAMINOPHEN 5-325 MG PO TABS
2.0000 | ORAL_TABLET | Freq: Once | ORAL | Status: AC
  Administered 2018-01-03: 2 via ORAL

## 2018-01-03 MED ORDER — DOXYCYCLINE HYCLATE 100 MG PO CAPS: ORAL_CAPSULE | ORAL | 0 refills | Status: DC

## 2018-01-03 MED ORDER — OXYCODONE-ACETAMINOPHEN 5-325 MG PO TABS
ORAL_TABLET | ORAL | Status: AC
  Administered 2018-01-03: 2 via ORAL
  Filled 2018-01-03: qty 2
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
## 2018-01-03 NOTE — ED Triage Notes (Signed)
Pt reports some worsening breathing over the past week. She has an appt on the 12th. Pt also reports a sore on her nose that she thinks may be MRSA>.

## 2018-01-03 NOTE — Telephone Encounter (Signed)
Called pt regarding her pain referral. I updated her with the clinic information with which the referral was placed and how the process can take 4-6 weeks. Pt is now complaining of new onset facial pain with cough and nose bleed and blurry vision. I advised patient to got to the ER due to these worrisome symptoms. Patient says that she has an apt with PCP on Friday and will discuss it with him then. If it worsens, she said she would go to the ER. Denies F/C.

## 2018-01-03 NOTE — Discharge Instructions (Signed)
As we discussed, the red nose represents a small area of infection.  Continue to apply antibiotic ointment on the inside of your nose twice daily.  Additionally we prescribed a medication called doxycycline and you should take it as written, twice a day for the next 10 days.  This should help with the infection including the possibility of MRSA.  Since you are out of the albuterol solution for nebulizer, we provided a prescription, as well as another prescription for prednisone because your lungs sound quite tight today.  Please follow-up with your lung doctor at the next available opportunity or at least as scheduled on June 12.    Return to the emergency department if you develop new or worsening symptoms that concern you.

## 2018-01-06 ENCOUNTER — Ambulatory Visit: Payer: Medicaid Other | Admitting: Family Medicine

## 2018-01-06 LAB — FECAL OCCULT BLOOD, IMMUNOCHEMICAL: Fecal Occult Bld: NEGATIVE

## 2018-01-12 ENCOUNTER — Other Ambulatory Visit: Payer: Self-pay | Admitting: Internal Medicine

## 2018-01-12 ENCOUNTER — Other Ambulatory Visit: Payer: Self-pay | Admitting: Family Medicine

## 2018-01-18 ENCOUNTER — Encounter: Payer: Self-pay | Admitting: Internal Medicine

## 2018-01-18 ENCOUNTER — Ambulatory Visit (INDEPENDENT_AMBULATORY_CARE_PROVIDER_SITE_OTHER): Payer: Medicaid Other | Admitting: Internal Medicine

## 2018-01-18 DIAGNOSIS — J449 Chronic obstructive pulmonary disease, unspecified: Secondary | ICD-10-CM | POA: Diagnosis not present

## 2018-01-18 DIAGNOSIS — J9611 Chronic respiratory failure with hypoxia: Secondary | ICD-10-CM | POA: Diagnosis not present

## 2018-01-18 DIAGNOSIS — Z720 Tobacco use: Secondary | ICD-10-CM | POA: Diagnosis not present

## 2018-01-18 MED ORDER — ALPRAZOLAM 1 MG PO TABS: 1.0000 mg | ORAL_TABLET | Freq: Three times a day (TID) | ORAL | 1 refills | Status: DC | PRN

## 2018-01-18 MED ORDER — ALBUTEROL SULFATE HFA 108 (90 BASE) MCG/ACT IN AERS: 2.0000 | INHALATION_SPRAY | Freq: Four times a day (QID) | RESPIRATORY_TRACT | 12 refills | Status: DC | PRN

## 2018-01-18 MED ORDER — CYCLOBENZAPRINE HCL 5 MG PO TABS: 5.0000 mg | ORAL_TABLET | Freq: Three times a day (TID) | ORAL | 0 refills | Status: DC | PRN

## 2018-01-18 MED ORDER — ALBUTEROL SULFATE (2.5 MG/3ML) 0.083% IN NEBU: 2.5000 mg | INHALATION_SOLUTION | Freq: Four times a day (QID) | RESPIRATORY_TRACT | 12 refills | Status: DC | PRN

## 2018-01-18 MED ORDER — UMECLIDINIUM-VILANTEROL 62.5-25 MCG/INH IN AEPB: 1.0000 | INHALATION_SPRAY | Freq: Every day | RESPIRATORY_TRACT | 0 refills | Status: DC

## 2018-01-18 NOTE — Progress Notes (Signed)
Patient ID: Megan Soto, female    DOB: [birth year], 57 y.o.   MRN: [id number]  HPI  female smoker followed for COPD, complicated by anxiety, DM, GERD, musculoskeletal pain/pain clinic, hepatitis C Walk test on room air 10/11/16 -Room air resting saturation 88%, desaturated to 86% walking on room air, recovered on 3 L to 97% Office Spirometry 10/11/2016-severe obstructive airways disease-FVC 2.30/73%, FEV1 1.21/49%, ratio 0.52 --------------------------------------------------------------------------------------  05/16/17- 57 year old female smoker followed for COPD, hypoxic respiratory failure, complicated by anxiety, DM, GERD, musculoskeletal pain/pain clinic, hepatitis C O2  2L sleep and prn/ Advanced COPD mixed type; DME [facility]. Pt continues to wear O2 24/7. Pt states she has had slight congestion and cold recently-was given ZPak here and then PCP gave additional abx to get through this. Since her husband died, daughter's family lives with her. She has cut down some on smoking. Xanax helps with this. Chronic cough continues to cause chest wall soreness diffusely. We discussed management of cough, emphasis on smoking cessation and reviewed current meds. We  reviewed CT scan imaging and pointed out emphysema change. CT chest 03/09/17 IMPRESSION: No acute cardiopulmonary disease. Aortic Atherosclerosis (ICD10-I70.0) and Emphysema (ICD10-J43.9).  01/18/2018- 57 year old female smoker followed for COPD, hypoxic respiratory failure, complicated by anxiety, DM, GERD, musculoskeletal pain/pain clinic, hepatitis C O2  2L sleep and prn/ Advanced -----COPD mixed type; Pt feels that her breathing is worse than before; restarted her nebulizer tx's and O2 as well.  Spiriva HandiHaler, Combivent Respimat, Flonase, neb albuterol Daughter with her points out that she continues to smoke-discussed.  We discussed alternative medication options. Chronic complaint of cramps in the back and rib  cage-discussed.  ROS-see HPI   + = positive Constitutional:    weight loss, night sweats, fevers, chills, fatigue, lassitude. HEENT:    headaches, difficulty swallowing, tooth/dental problems, sore throat,       sneezing, itching, ear ache, nasal congestion, post nasal drip, snoring CV:    +chest pain, orthopnea, PND, swelling in lower extremities, anasarca,                                               dizziness, palpitations Resp:  + shortness of breath with exertion or at rest.                +productive cough,   + non-productive cough, coughing up of blood.              change in color of mucus.  + wheezing.   Skin:    rash or lesions. GI:  No-   heartburn, indigestion, abdominal pain, nausea, vomiting, GU: dysuria, change in color of urine, no urgency or frequency.   flank pain. MS:  + joint pain, stiffness, decreased range of motion, back pain. Neuro-     nothing unusual Psych:  change in mood or affect.  +depression or anxiety.   memory loss.  OBJ- Physical Exam General- Alert, Oriented, Affect-pleasant/  anxious, Distress- none acute, + slender Skin- rash-none, lesions- none, excoriation- none Lymphadenopathy- none Head- atraumatic            Eyes- Gross vision intact, PERRLA, conjunctivae and secretions clear            Ears- Hearing, canals-normal            Nose- Clear, no-Septal dev, mucus, polyps, erosion, perforation  Throat- Mallampati II , mucosa clear , drainage- none, tonsils- atrophic, + missing teeth  Neck- flexible , trachea midline, no stridor , thyroid nl, carotid no bruit Chest - symmetrical excursion , unlabored, no masses           Heart/CV- RRR , no murmur , no gallop  , no rub, nl s1 s2                           - JVD- none , edema- none, stasis changes- none, varices- none           Lung-  Cough + light, wheeze + with forced expiration,  dullness-none, rub- none Abd-  Br/ Gen/ Rectal- Not done, not indicated Extrem- cyanosis- none, clubbing,  none, atrophy- none, strength- nl Neuro- grossly intact to observation

## 2018-01-18 NOTE — Assessment & Plan Note (Signed)
She remains on oxygen continuously now except for intervals when she is sitting quietly.

## 2018-01-18 NOTE — Assessment & Plan Note (Signed)
Severe COPD is expect to get gradually worse according the normal history of this disease, especially since she will not stop smoking. Plan-[facility] changing [facility].  Nebulizer solution refilled.  Medication talk done.

## 2018-01-18 NOTE — Assessment & Plan Note (Signed)
Her family is pressing her make more effort to stop smoking.  She blames it on "nerves" and discussed previous experience with Chantix.

## 2018-01-18 NOTE — Patient Instructions (Addendum)
Script sent refilling nebulizer solution and Proair  Sample and script to try Anoro inhaler     Inhale 1 puff, once daily   Try this instead of Spiriva. You can use up the Spiriva first if you want to. If Medicaid pays for it, Anoro would be your new maintenance inhaler.  Script printed for flexeril to help with cramps  You can also try tonic water for cramps     1 juice glass after supper for a few days to see if it helps.

## 2018-01-19 ENCOUNTER — Telehealth: Payer: Self-pay | Admitting: Family Medicine

## 2018-01-19 NOTE — Telephone Encounter (Signed)
Pt has not heard from referral to pain clinic 12-08-17.  She needs to be seen sooner than later. If this is going to take awhile, then could she be referred to somewhere else? Please advise

## 2018-01-19 NOTE — Telephone Encounter (Signed)
Will forward to referral coordinator. Jazmin Hartsell,CMA  

## 2018-01-23 ENCOUNTER — Telehealth: Payer: Self-pay | Admitting: Family Medicine

## 2018-01-24 ENCOUNTER — Other Ambulatory Visit: Payer: Self-pay

## 2018-01-24 ENCOUNTER — Encounter: Payer: Self-pay | Admitting: Family Medicine

## 2018-01-24 ENCOUNTER — Ambulatory Visit (INDEPENDENT_AMBULATORY_CARE_PROVIDER_SITE_OTHER): Payer: Medicaid Other | Admitting: Family Medicine

## 2018-01-24 VITALS — BP 124/76 | HR 89 | Temp 97.5°F | Ht 62.0 in | Wt 108.0 lb

## 2018-01-24 DIAGNOSIS — G8929 Other chronic pain: Secondary | ICD-10-CM

## 2018-01-24 DIAGNOSIS — Z1211 Encounter for screening for malignant neoplasm of colon: Secondary | ICD-10-CM | POA: Diagnosis not present

## 2018-01-24 DIAGNOSIS — G894 Chronic pain syndrome: Secondary | ICD-10-CM | POA: Diagnosis present

## 2018-01-24 DIAGNOSIS — M545 Low back pain, unspecified: Secondary | ICD-10-CM

## 2018-01-24 DIAGNOSIS — Z1239 Encounter for other screening for malignant neoplasm of breast: Secondary | ICD-10-CM

## 2018-01-24 DIAGNOSIS — Z1231 Encounter for screening mammogram for malignant neoplasm of breast: Secondary | ICD-10-CM

## 2018-01-24 DIAGNOSIS — H524 Presbyopia: Secondary | ICD-10-CM

## 2018-01-24 MED ORDER — PNEUMOCOCCAL 13-VAL CONJ VACC IM SUSP: 0.5000 mL | INTRAMUSCULAR | 0 refills | Status: AC

## 2018-01-24 NOTE — Telephone Encounter (Signed)
Pt states that she was told by Annice PihJackie that she was going to send her somewhere else to a [facility] clinic.  I do not see an notation about this, transferred to to jackie/VM for clarification.

## 2018-01-24 NOTE — Assessment & Plan Note (Signed)
Obtained UDS from patient today.  Currently awaiting pain clinic referral.  As discussed previous encounter, will not prescribe any medications until have a clean UDS.  Patient is high risk for opioid overdose and dependence.  Patient also takes Xanax 1 mg 3 times daily prescribed from her pulmonologist.  York SpanielSaid that I would not prescribe more than 10 mg twice daily for pain.  If he has clean, will prescribe for 1 month.  Have patient return.  Need to discuss further plans as it does not feel like I should be a long-term provider given patient's high risk.

## 2018-01-24 NOTE — Progress Notes (Signed)
oz

## 2018-01-24 NOTE — Patient Instructions (Signed)

## 2018-01-24 NOTE — Telephone Encounter (Signed)
Pt has a hold up on her Pain clinic referral.   See previous phone note.  She is asking for pain medication until the referral can be straightened out and she can go to see them. Baldo Hufnagle, Maryjo RochesterJessica Dawn, CMA

## 2018-01-24 NOTE — Progress Notes (Signed)
    Subjective:  Megan SakaiRobin G Coble is a 57 y.o. female [facility] with a chief complaint of chronic lower back pain.   HPI:  Chronic BACK PAIN  Back pain began many years ago . Pain is described as constant. Patient has tried gabapentin, Lyrica, the spinal vaccine, Cymbalta, Effexor, ibuprofen, Tylenol without relief.  Norco 10 mg/325 every 6 daily is the only thing she says it works for her.  Says that she has not taken anything for 2 days.  Says that she has only taken "a bit from a friend. Pain radiates nowhere. History of trauma or injury: Denies any Patient believes might be causing their pain: Disc disease  Prior history of similar pain: Yes History of cancer: No Weak immune system: No History of IV drug use: Denies but does have hepatitis C History of steroid use: None  Symptoms Incontinence of bowel or bladder: No Numbness of leg: None Fever: No Rest or Night pain: None Weight Loss: Denies Rash: No   ROS see HPI Smoking Status noted.     Objective:  Physical Exam: BP 124/76   Pulse 89   Temp (!) 97.5 F (36.4 C) (Oral)   Ht 5\' 2"  (1.575 m)   Wt 108 lb (49 kg)   SpO2 98% Comment: 2L of oxygen  BMI 19.75 kg/m   Gen: NAD, resting comfortably CV: RRR with no murmurs appreciated Pulm: NWOB, CTAB with no crackles, wheezes, or rhonchi GI: Normal bowel sounds present. Soft, Nontender, Nondistended. MSK: Back nontender to palpation, no erythema, no step-off or fracture Skin: warm, dry Neuro: grossly normal, moves all extremities Psych: Normal affect and thought content  No results found for this or any previous visit (from the past 72 hour(s)).   Assessment/Plan:  Chronic low back pain without sciatica Obtained UDS from patient today.  Currently awaiting pain clinic referral.  As discussed previous encounter, will not prescribe any medications until have a clean UDS.  Patient is high risk for opioid overdose and dependence.  Patient also  takes Xanax 1 mg 3 times daily prescribed from her pulmonologist.  York SpanielSaid that I would not prescribe more than 10 mg twice daily for pain.  If he has clean, will prescribe for 1 month.  Have patient return.  Need to discuss further plans as it does not feel like I should be a long-term provider given patient's high risk.    Lab Orders     ToxASSURE Select 13 (MW), Urine  Meds ordered this encounter  Medications  . pneumococcal 13-valent conjugate vaccine (PREVNAR 13) SUSP injection    Sig: Inject 0.5 mLs into the muscle tomorrow at 10 am for 1 dose.    Dispense:  0.5 mL    Refill:  0    Thomes DinningBrad Doryan Bahl, MD, MS FAMILY MEDICINE RESIDENT - PGY1 01/24/2018 4:47 PM

## 2018-01-26 ENCOUNTER — Telehealth: Payer: Self-pay | Admitting: *Deleted

## 2018-01-26 NOTE — Telephone Encounter (Signed)
Pt calls nurse line to check results of her urine.   Pt wants to let provider know that she has declined the referral to pain clinic because they want to "shock her" and she feels like "that killed her sister".  Will forward to MD. Daris Aristizabal, Maryjo RochesterJessica Dawn, CMA

## 2018-01-27 ENCOUNTER — Encounter: Payer: Self-pay | Admitting: Gastroenterology

## 2018-01-27 DIAGNOSIS — J449 Chronic obstructive pulmonary disease, unspecified: Secondary | ICD-10-CM | POA: Diagnosis not present

## 2018-01-31 ENCOUNTER — Other Ambulatory Visit: Payer: Self-pay | Admitting: Family Medicine

## 2018-01-31 ENCOUNTER — Telehealth: Payer: Self-pay

## 2018-01-31 DIAGNOSIS — G894 Chronic pain syndrome: Secondary | ICD-10-CM

## 2018-01-31 LAB — TOXASSURE SELECT 13 (MW), URINE

## 2018-01-31 MED ORDER — HYDROCODONE-ACETAMINOPHEN 10-325 MG PO TABS: 1.0000 | ORAL_TABLET | Freq: Two times a day (BID) | ORAL | 0 refills | Status: DC | PRN

## 2018-01-31 NOTE — Telephone Encounter (Signed)
Garnette Gunnerhompson, Aaron B, MD  [facility] 37 minutes ago (9:30 AM)    Pls call pt. Tell them that UDS is neg. Will write Rx for Norco 10/325 mg BID prn for 1 mo. Pt will need to come back to f/u apt and have UDS tested again.    Routing comment     Garnette Gunnerhompson, Aaron B, MD 42 minutes ago (9:25 AM)      Pt initial UDS neg. Will write rx for 1 mo. Pt needs to have f/u in 1 mo with repeat UDS.       Documentation

## 2018-01-31 NOTE — Telephone Encounter (Signed)
Pt contacted and informed of rx to her pharmacy. Pt informed she will need to come in monthly for this rx. Pt voiced understanding and was very appreciative.

## 2018-01-31 NOTE — Progress Notes (Signed)
Pt initial UDS neg. Will write rx for 1 mo. Pt needs to have f/u in 1 mo with repeat UDS.

## 2018-02-01 ENCOUNTER — Telehealth: Payer: Self-pay | Admitting: *Deleted

## 2018-02-01 NOTE — Telephone Encounter (Signed)
Patient states that she will need a PA on norco.   Have not received info from pharmacy, placed form in Dr. Carollee Massedhompson's box for completion.  Please return to RN clinic. Dewayne Severe, Maryjo RochesterJessica Dawn, CMA

## 2018-02-06 ENCOUNTER — Telehealth: Payer: Self-pay | Admitting: Internal Medicine

## 2018-02-06 ENCOUNTER — Other Ambulatory Visit: Payer: Self-pay | Admitting: Internal Medicine

## 2018-02-06 DIAGNOSIS — J449 Chronic obstructive pulmonary disease, unspecified: Secondary | ICD-10-CM | POA: Diagnosis not present

## 2018-02-06 NOTE — Telephone Encounter (Signed)
lmtcb x1 for pt. 

## 2018-02-06 NOTE — Telephone Encounter (Signed)
Prior approval for Hydrocodone-Acetaminophen  completed via [employer].  Med approved for 02/06/18 - 08/05/18  Prior approval # [id number].  [facility] informed.  Ples SpecterAlisa Brake, RN [facility])

## 2018-02-07 MED ORDER — UMECLIDINIUM-VILANTEROL 62.5-25 MCG/INH IN AEPB: 1.0000 | INHALATION_SPRAY | Freq: Every day | RESPIRATORY_TRACT | 3 refills | Status: DC

## 2018-02-07 NOTE — Telephone Encounter (Signed)
Pt is calling back [phone]

## 2018-02-07 NOTE — Telephone Encounter (Signed)
Called and spoke with patient regarding refills on Combivent Respimat, Flexeril, and Anoro. Pt states the sample of Anoro is working well for her; therefore requesting new RX  Sent in [facility] for [facility]  [facility] regarding RX for combivent and flexeril to verify they rec'd them. She rec'd them on 01/18/18; pt is not due for refill until 02/17/2018.  Pt is aware of above information, and verbalized understanding, had no further concerns. Nothing further needed at this time.

## 2018-02-15 ENCOUNTER — Telehealth: Payer: Self-pay | Admitting: Internal Medicine

## 2018-02-15 ENCOUNTER — Other Ambulatory Visit: Payer: Self-pay | Admitting: Internal Medicine

## 2018-02-15 NOTE — Telephone Encounter (Signed)
Received refill request of flexeril. Dr. Maple HudsonYoung, please advise if it is okay for us to refill med for pt. Thanks!

## 2018-02-16 ENCOUNTER — Other Ambulatory Visit: Payer: Self-pay | Admitting: Internal Medicine

## 2018-02-16 NOTE — Telephone Encounter (Signed)
Ok to refill "must last 1 month" 

## 2018-02-16 NOTE — Telephone Encounter (Signed)
Attempted to call pt. I did not receive an answer. I have left a message for pt to return our call.  

## 2018-02-16 NOTE — Telephone Encounter (Signed)
Pt is returning call Cb is [phone]

## 2018-02-16 NOTE — Telephone Encounter (Signed)
Already answered this AM- she can have the refill, but it must last 1 month.

## 2018-02-16 NOTE — Telephone Encounter (Signed)
Spoke with pt. She is aware of Dr. Roxy CedarYoung's response. Rx has been sent in. Nothing further was needed.

## 2018-02-26 DIAGNOSIS — J449 Chronic obstructive pulmonary disease, unspecified: Secondary | ICD-10-CM | POA: Diagnosis not present

## 2018-02-27 ENCOUNTER — Other Ambulatory Visit: Payer: Self-pay | Admitting: Family Medicine

## 2018-02-27 DIAGNOSIS — G894 Chronic pain syndrome: Secondary | ICD-10-CM

## 2018-02-27 NOTE — Telephone Encounter (Signed)
Pt needs refill on her norco. She runs out July 25.  She could not get an appt until aug to do her urine test. She would like to get the norco increase by one pill per day.  Please advise

## 2018-02-27 NOTE — Telephone Encounter (Signed)
RX has been sent in for pt Pt aware of CY recommendations Nothing further needed

## 2018-02-28 MED ORDER — HYDROCODONE-ACETAMINOPHEN 10-325 MG PO TABS: 1.0000 | ORAL_TABLET | Freq: Two times a day (BID) | ORAL | 0 refills | Status: AC | PRN

## 2018-02-28 NOTE — Telephone Encounter (Signed)
Refilled med for 2 weeks until pt can be seen. Will not increase med until evaluation. Pls call pt and inform. Thank you.

## 2018-03-01 NOTE — Telephone Encounter (Signed)
Pt contacted and informed of 2 week refill, must come to apt to get additional. Pt voiced understanding.

## 2018-03-07 DIAGNOSIS — M545 Low back pain: Secondary | ICD-10-CM | POA: Diagnosis not present

## 2018-03-07 DIAGNOSIS — G8929 Other chronic pain: Secondary | ICD-10-CM | POA: Diagnosis not present

## 2018-03-07 DIAGNOSIS — Z681 Body mass index (BMI) 19 or less, adult: Secondary | ICD-10-CM | POA: Diagnosis not present

## 2018-03-09 ENCOUNTER — Other Ambulatory Visit: Payer: Self-pay | Admitting: *Deleted

## 2018-03-09 DIAGNOSIS — J449 Chronic obstructive pulmonary disease, unspecified: Secondary | ICD-10-CM | POA: Diagnosis not present

## 2018-03-10 ENCOUNTER — Other Ambulatory Visit: Payer: Self-pay | Admitting: Internal Medicine

## 2018-03-10 MED ORDER — AMLODIPINE BESYLATE 5 MG PO TABS: 5.0000 mg | ORAL_TABLET | Freq: Every day | ORAL | 5 refills | Status: DC

## 2018-03-15 ENCOUNTER — Ambulatory Visit: Payer: Medicaid Other | Admitting: Family Medicine

## 2018-03-17 ENCOUNTER — Telehealth: Payer: Self-pay | Admitting: Internal Medicine

## 2018-03-17 MED ORDER — ALPRAZOLAM 1 MG PO TABS: 1.0000 mg | ORAL_TABLET | Freq: Three times a day (TID) | ORAL | 0 refills | Status: DC | PRN

## 2018-03-17 MED ORDER — CYCLOBENZAPRINE HCL 5 MG PO TABS: ORAL_TABLET | ORAL | 0 refills | Status: DC

## 2018-03-17 MED ORDER — AZITHROMYCIN 250 MG PO TABS: ORAL_TABLET | ORAL | 0 refills | Status: DC

## 2018-03-17 NOTE — Telephone Encounter (Signed)
I had to create another note to close encounter. This is the original message:  Spoke with Megan Soto, she states she is requesting ABX for a boil on her nose. She ended up in the hospital the last time she let it get worse. She states she feels like one is coming up again because it has become sore and she wants to catch it before it gets worse. 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 CY please advise.  She also needs her Xanax and Flexeril refilled-[facility].

## 2018-03-17 NOTE — Telephone Encounter (Signed)
Offer Zpak   250 mg, # 6, 2 today then one daily  Ok to refill xanax and flexeril this time only, no other refills

## 2018-03-17 NOTE — Telephone Encounter (Signed)
Called spoke with patient, advised of CY's recommendations as stated below. Patient voiced her understanding. Rx's sent to verified pharmacy. Patient is aware to keep her 10.14.19 appt with CY. Nothing further needed at this time, will sign off.

## 2018-03-29 DIAGNOSIS — J449 Chronic obstructive pulmonary disease, unspecified: Secondary | ICD-10-CM | POA: Diagnosis not present

## 2018-03-31 ENCOUNTER — Ambulatory Visit: Payer: Medicaid Other | Admitting: Gastroenterology

## 2018-04-04 DIAGNOSIS — M545 Low back pain: Secondary | ICD-10-CM | POA: Diagnosis not present

## 2018-04-04 DIAGNOSIS — Z79899 Other long term (current) drug therapy: Secondary | ICD-10-CM | POA: Diagnosis not present

## 2018-04-04 DIAGNOSIS — G8929 Other chronic pain: Secondary | ICD-10-CM | POA: Diagnosis not present

## 2018-04-04 DIAGNOSIS — Z681 Body mass index (BMI) 19 or less, adult: Secondary | ICD-10-CM | POA: Diagnosis not present

## 2018-04-09 DIAGNOSIS — J449 Chronic obstructive pulmonary disease, unspecified: Secondary | ICD-10-CM | POA: Diagnosis not present

## 2018-04-12 ENCOUNTER — Other Ambulatory Visit: Payer: Self-pay | Admitting: Internal Medicine

## 2018-04-17 ENCOUNTER — Other Ambulatory Visit: Payer: Self-pay | Admitting: Internal Medicine

## 2018-04-17 ENCOUNTER — Telehealth: Payer: Self-pay | Admitting: Internal Medicine

## 2018-04-17 MED ORDER — ALPRAZOLAM 1 MG PO TABS: 1.0000 mg | ORAL_TABLET | Freq: Three times a day (TID) | ORAL | 0 refills | Status: DC | PRN

## 2018-04-17 NOTE — Telephone Encounter (Signed)
Ok to refill this time 

## 2018-04-17 NOTE — Telephone Encounter (Signed)
Please advise if okay to refill xanax 1 mg  Last given #90 tabs with no refills on 03/17/18 Last ov was 01/18/18 and next ov 05/22/18

## 2018-04-17 NOTE — Telephone Encounter (Signed)
Patient medication has been phoned in to pharmacy. Called and spoke with patient, she is aware. Nothing further needed.

## 2018-04-29 DIAGNOSIS — J449 Chronic obstructive pulmonary disease, unspecified: Secondary | ICD-10-CM | POA: Diagnosis not present

## 2018-05-08 ENCOUNTER — Other Ambulatory Visit: Payer: Self-pay | Admitting: Internal Medicine

## 2018-05-08 NOTE — Telephone Encounter (Signed)
CY Please advise on refill for Alprazolam. Thanks .

## 2018-05-09 DIAGNOSIS — Z681 Body mass index (BMI) 19 or less, adult: Secondary | ICD-10-CM | POA: Diagnosis not present

## 2018-05-09 DIAGNOSIS — G8929 Other chronic pain: Secondary | ICD-10-CM | POA: Diagnosis not present

## 2018-05-09 DIAGNOSIS — Z79899 Other long term (current) drug therapy: Secondary | ICD-10-CM | POA: Diagnosis not present

## 2018-05-09 DIAGNOSIS — M545 Low back pain: Secondary | ICD-10-CM | POA: Diagnosis not present

## 2018-05-09 DIAGNOSIS — J449 Chronic obstructive pulmonary disease, unspecified: Secondary | ICD-10-CM | POA: Diagnosis not present

## 2018-05-09 NOTE — Telephone Encounter (Signed)
Ok to refill xanax this time Ok to refill Combivent x 12 months

## 2018-05-10 ENCOUNTER — Telehealth: Payer: Self-pay

## 2018-05-10 NOTE — Telephone Encounter (Signed)
Called patient to answer questions about blood pressure. Patient says that she has been feeling a little more tired than normal. Denies dizziness. She has a home BP cuff. She has not taken her blood pressure at home. I advised her to continue her BP medications until she can have multiple home readings over the next few days. She can then call the clinic back with those results and we can determine if a medication can be held. I also advised patient to schedule an appointment to also have an office pressure reading for further assessment and management.

## 2018-05-10 NOTE — Telephone Encounter (Signed)
Patient called nurse line stating that her BP was low at pain clinic yesterday. Could not remember what BP was. Thinks she needs to come off a couple of her medications for BP.   Does not have an appt scheduled but said she would have to call back to schedule. Wants to know if she should stop taking one or two of her four meds for BP.  Call back is [phone]  Ples Specter, RN [facility] RN)

## 2018-05-15 ENCOUNTER — Telehealth: Payer: Self-pay | Admitting: Internal Medicine

## 2018-05-15 NOTE — Telephone Encounter (Signed)
Spoke with pt. She is requesting a refill on Xanax. Upon further review of her chart, this medication was called in on 05/09/18 #90 with 0 refills. Pt states that since originally calling us, the pharmacy delivered this prescription to her home. Nothing further was needed.

## 2018-05-16 ENCOUNTER — Telehealth: Payer: Self-pay | Admitting: Family Medicine

## 2018-05-16 NOTE — Telephone Encounter (Signed)
Taking lisinopril 20 mg and amlodipine 5 mg  daily  Pt Readings at home (she takes them twice at each sitting):  05/11/18 @ 12:14 116/92                              111/94                @  5pm  130/83                              131/85  05/12/18 @11am  141/92                            125/107               @ 4:30 118/69                            125/69  05/13/18  @ 11:10 139/101                              125/91  05/14/18 @ 4:30 153/110                           151/82   Pt will await callback from MD for instruction. Everton Bertha, Maryjo Rochester, CMA

## 2018-05-16 NOTE — Telephone Encounter (Signed)
Called pt. Told her that her BP was above goal (130/80) on several readings on lisinopril and amlodipine. I advised her that she should take her bisoprolol/hctz as well. Advised pt to make an appointment for further BP mgmt. She acknowledged this and said she would do so soon.

## 2018-05-17 ENCOUNTER — Telehealth: Payer: Self-pay | Admitting: Internal Medicine

## 2018-05-17 ENCOUNTER — Other Ambulatory Visit: Payer: Self-pay

## 2018-05-17 MED ORDER — CYCLOBENZAPRINE HCL 10 MG PO TABS: 10.0000 mg | ORAL_TABLET | Freq: Three times a day (TID) | ORAL | 3 refills | Status: DC | PRN

## 2018-05-17 MED ORDER — AMOXICILLIN 500 MG PO CAPS: 500.0000 mg | ORAL_CAPSULE | Freq: Two times a day (BID) | ORAL | 0 refills | Status: DC

## 2018-05-17 NOTE — Telephone Encounter (Signed)
Called and spoke with pt who states she has had tightness in chest as well as coughing up yellow mucus which she states has been like this x2 days. Pt states she believes the symptoms started due to the change in weather.  Pt also states she has had a toothache that has also been bothering her.  Pt denies any fever.  Pt is wanting something to be prescribed to help with her symptoms; asking for amoxicillin instead of a zpak. Pt declines coming in for an appt.  Allergies  Allergen Reactions  . Aspirin Nausea And Vomiting    Current Outpatient Medications:  .  albuterol (PROAIR HFA) 108 (90 Base) MCG/ACT inhaler, Inhale 2 puffs into the lungs every 6 (six) hours as needed for wheezing or shortness of breath., Disp: 2 Inhaler, Rfl: 12 .  albuterol (PROVENTIL) (2.5 MG/3ML) 0.083% nebulizer solution, Take 3 mLs (2.5 mg total) by nebulization every 6 (six) hours as needed for wheezing or shortness of breath., Disp: 75 mL, Rfl: 12 .  ALPRAZolam (XANAX) 1 MG tablet, TAKE 1 TABLET BY MOUTH 3 TIMES A DAY AS NEEDED FOR ANXIETY, Disp: 90 tablet, Rfl: 0 .  amLODipine (NORVASC) 5 MG tablet, Take 1 tablet (5 mg total) by mouth daily., Disp: 30 tablet, Rfl: 5 .  azithromycin (ZITHROMAX) 250 MG tablet, Take as directed, Disp: 6 tablet, Rfl: 0 .  bisoprolol-hydrochlorothiazide (ZIAC) 5-6.25 MG tablet, Take 1 tablet by mouth daily., Disp: , Rfl: 2 .  cetirizine (ZYRTEC) 10 MG tablet, Take 1 tablet (10 mg total) by mouth daily., Disp: 10 tablet, Rfl: 12 .  COMBIVENT RESPIMAT 20-100 MCG/ACT AERS respimat, INHALE 1 PUFF EVERY 6 HOURS AS NEEDED, Disp: 4 g, Rfl: 11 .  cyclobenzaprine (FLEXERIL) 5 MG tablet, TAKE 1 TABLET BY MOUTH 3 TIMES DAILY AS NEEDED FOR MUSCLE SPASMS., Disp: 30 tablet, Rfl: 0 .  fluticasone (FLONASE) 50 MCG/ACT nasal spray, Place 2 sprays into both nostrils daily., Disp: 16 g, Rfl: 11 .  lisinopril (PRINIVIL,ZESTRIL) 40 MG tablet, Take 40 mg by mouth daily., Disp: , Rfl:  .  metFORMIN  (GLUCOPHAGE) 500 MG tablet, Take 500 mg by mouth 2 (two) times daily with a meal. , Disp: , Rfl:  .  Multiple Vitamin (MULTIVITAMIN WITH MINERALS) TABS, Take 1 tablet by mouth daily., Disp: , Rfl:  .  omeprazole (PRILOSEC) 40 MG capsule, Take 1 capsule (40 mg total) by mouth daily., Disp: 30 capsule, Rfl: 3 .  SPIRIVA HANDIHALER 18 MCG inhalation capsule, PLACE 1 CAPSULE INTO INHALER AND INHALE ONCE DAILY AS DIRECTED KEEP OFFICE VISIT FOR ADDITIONAL REFILLS, Disp: 30 capsule, Rfl: 0 .  umeclidinium-vilanterol (ANORO ELLIPTA) 62.5-25 MCG/INH AEPB, Inhale 1 puff into the lungs daily., Disp: 1 each, Rfl: 0 .  umeclidinium-vilanterol (ANORO ELLIPTA) 62.5-25 MCG/INH AEPB, Inhale 1 puff into the lungs daily., Disp: 1 each, Rfl: 3

## 2018-05-17 NOTE — Telephone Encounter (Signed)
Offer amoxacillin 500 mg, # 20, 1 twice daily

## 2018-05-17 NOTE — Telephone Encounter (Signed)
Called and spoke with pt letting her know that we were sending Rx amoxicillin to her pharmacy for her.  Pt expressed understanding. Verified pt's preferred pharmacy and sent Rx in. Nothing further needed.

## 2018-05-22 ENCOUNTER — Ambulatory Visit: Payer: Medicaid Other | Admitting: Internal Medicine

## 2018-05-22 ENCOUNTER — Telehealth: Payer: Self-pay | Admitting: Internal Medicine

## 2018-05-22 NOTE — Telephone Encounter (Signed)
This can be done at pt's OV with CY today.  Called and spoke with pt letting her know that at the OV, we need to requalify her for the POC per [facility].  Pt stated she needed to reschedule today's OV for a later day.  Appt has been rescheduled Thurs, 10/17 at 10am. I have made a note in the appt notes that pt needs to be requalified for POC.  Nothing further needed.

## 2018-05-22 NOTE — Telephone Encounter (Signed)
[employer] as well.

## 2018-05-22 NOTE — Telephone Encounter (Signed)
Notified Delaney Meigs, as she is working with [facility] today.

## 2018-05-25 ENCOUNTER — Ambulatory Visit (INDEPENDENT_AMBULATORY_CARE_PROVIDER_SITE_OTHER)
Admission: RE | Admit: 2018-05-25 | Discharge: 2018-05-25 | Disposition: A | Discharge status: Home / Self Care | Payer: Medicaid Other | Source: Ambulatory Visit | Attending: Internal Medicine | Admitting: Internal Medicine

## 2018-05-25 ENCOUNTER — Encounter: Payer: Self-pay | Admitting: Internal Medicine

## 2018-05-25 ENCOUNTER — Other Ambulatory Visit: Payer: Self-pay | Admitting: Internal Medicine

## 2018-05-25 ENCOUNTER — Ambulatory Visit (INDEPENDENT_AMBULATORY_CARE_PROVIDER_SITE_OTHER): Payer: Medicaid Other | Admitting: Internal Medicine

## 2018-05-25 VITALS — BP 128/75 | HR 107 | Ht 62.0 in | Wt 103.4 lb

## 2018-05-25 DIAGNOSIS — Z23 Encounter for immunization: Secondary | ICD-10-CM

## 2018-05-25 DIAGNOSIS — Z720 Tobacco use: Secondary | ICD-10-CM

## 2018-05-25 DIAGNOSIS — J441 Chronic obstructive pulmonary disease with (acute) exacerbation: Secondary | ICD-10-CM | POA: Diagnosis not present

## 2018-05-25 DIAGNOSIS — J9611 Chronic respiratory failure with hypoxia: Secondary | ICD-10-CM | POA: Diagnosis not present

## 2018-05-25 DIAGNOSIS — J189 Pneumonia, unspecified organism: Secondary | ICD-10-CM

## 2018-05-25 DIAGNOSIS — J449 Chronic obstructive pulmonary disease, unspecified: Secondary | ICD-10-CM | POA: Diagnosis not present

## 2018-05-25 DIAGNOSIS — F411 Generalized anxiety disorder: Secondary | ICD-10-CM

## 2018-05-25 DIAGNOSIS — R05 Cough: Secondary | ICD-10-CM | POA: Diagnosis not present

## 2018-05-25 MED ORDER — METHYLPREDNISOLONE ACETATE 80 MG/ML IJ SUSP
80.0000 mg | Freq: Once | INTRAMUSCULAR | Status: AC
  Administered 2018-05-25: 80 mg via INTRAMUSCULAR

## 2018-05-25 MED ORDER — AZITHROMYCIN 250 MG PO TABS: 250.0000 mg | ORAL_TABLET | Freq: Every day | ORAL | 0 refills | Status: DC

## 2018-05-25 NOTE — Patient Instructions (Signed)
Order- CXR   Dx COPD exacerbation  We will order service or replacement for old POC- 2 L pulse  Order- flu vax standard  Order- depo 80  Dx COPD exacerbation

## 2018-05-25 NOTE — Progress Notes (Signed)
Patient ID: Megan Soto, female    DOB: [birth year], 57 y.o.   MRN: [id number]  HPI  female smoker followed for COPD, complicated by anxiety, DM, GERD, musculoskeletal pain/pain clinic, hepatitis C Walk test on room air 10/11/16 -Room air resting saturation 88%, desaturated to 86% walking on room air, recovered on 3 L to 97% Office Spirometry 10/11/2016-severe obstructive airways disease-FVC 2.30/73%, FEV1 1.21/49%, ratio 0.52 --------------------------------------------------------------------------------------  01/18/2018- 57 year old female smoker followed for COPD, hypoxic respiratory failure, complicated by anxiety, DM, GERD, musculoskeletal pain/pain clinic, hepatitis C O2  2L sleep and prn/ [facility] -----COPD mixed type; Pt feels that her breathing is worse than before; restarted her nebulizer tx's and O2 as well.  Spiriva HandiHaler, Combivent Respimat, Flonase, neb albuterol Daughter with her points out that she continues to smoke-discussed.  We discussed alternative medication options. Chronic complaint of cramps in the back and rib cage-discussed.  05/25/2018- 57 year old female smoker followed for COPD, hypoxic respiratory failure, complicated by anxiety, DM, GERD, musculoskeletal pain/pain clinic, hepatitis C O2  2L sleep and prn/ [facility]> [facility] -----COPD; DME [facility]; Pt states she needs new POC from [facility] as her current one is not working well enough. Pt notes cough-blood tinged and chest tightness.  Spiriva HandiHaler, Combivent Respimat, Anoro, pro-air HFA, neb albuterol, Requalified today for POC O2.  Apparently she had gotten the POC from [place].  She says that this POC is "not pumping right". Has reduced smoking-has to go outside to smoke now.  Not sure if they will use wood heater in house this winter.  She has all of her inhaled meds.  Continue Xanax for anxiety-does not need refill now. Increased cough with brownish and streak of blood sputum in the last couple of  days.  Denies fever, adenopathy, chest pain.  ROS-see HPI   + = positive Constitutional:    weight loss, night sweats, fevers, chills, fatigue, lassitude. HEENT:    headaches, difficulty swallowing, tooth/dental problems, sore throat,       sneezing, itching, ear ache, nasal congestion, post nasal drip, snoring CV:    chest pain, orthopnea, PND, swelling in lower extremities, anasarca,                                               dizziness, palpitations Resp:  + shortness of breath with exertion or at rest.                +productive cough,   + non-productive cough, +coughing up of blood.              change in color of mucus.  + wheezing.   Skin:    rash or lesions. GI:  No-   heartburn, indigestion, abdominal pain, nausea, vomiting, GU: dysuria, change in color of urine, no urgency or frequency.   flank pain. MS:  + joint pain, stiffness, decreased range of motion, back pain. Neuro-     nothing unusual Psych:  change in mood or affect.  +depression or anxiety.   memory loss.  OBJ- Physical Exam General- Alert, Oriented, Affect-pleasant/  anxious, Distress- none acute, + slender Skin- rash-none, lesions- none, excoriation- none Lymphadenopathy- none Head- atraumatic            Eyes- Gross vision intact, PERRLA, conjunctivae and secretions clear            Ears- Hearing, canals-normal  Nose- Clear, no-Septal dev, mucus, polyps, erosion, perforation             Throat- Mallampati II , mucosa clear , drainage- none, tonsils- atrophic, + missing teeth , + hoarse Neck- flexible , trachea midline, no stridor , thyroid nl, carotid no bruit Chest - symmetrical excursion , unlabored, no masses           Heart/CV- RRR , no murmur , no gallop  , no rub, nl s1 s2                           - JVD- none , edema- none, stasis changes- none, varices- none           Lung-  Cough + light, wheeze + trace,  dullness-none, rub- none Abd-  Br/ Gen/ Rectal- Not done, not indicated Extrem-  cyanosis- none, clubbing, none, atrophy- none, strength- nl Neuro- grossly intact to observation

## 2018-05-26 NOTE — Assessment & Plan Note (Signed)
She is still actively smoking but has cut down to less than 1 pack/day and is strongly encouraged to stop completely.  Support options reviewed.

## 2018-05-26 NOTE — Assessment & Plan Note (Signed)
Always anxious but seems more calm and grounded at this visit.  We discussed use of Xanax.  She will call for prescription refill as instructed, as needed.

## 2018-05-26 NOTE — Assessment & Plan Note (Signed)
We are clarifying which DME company is providing her oxygen system support.  Continue POC.

## 2018-05-26 NOTE — Assessment & Plan Note (Signed)
Moderately severe COPD with acute exacerbation and new description of blood streak.  We will order CXR and make decision about antibiotic from that. Plan-flu vaccine, CXR, Depo-Medrol.  Call back about antibiotic.

## 2018-05-29 DIAGNOSIS — J449 Chronic obstructive pulmonary disease, unspecified: Secondary | ICD-10-CM | POA: Diagnosis not present

## 2018-05-30 ENCOUNTER — Ambulatory Visit: Payer: Medicaid Other | Admitting: Gastroenterology

## 2018-06-01 DIAGNOSIS — J449 Chronic obstructive pulmonary disease, unspecified: Secondary | ICD-10-CM | POA: Diagnosis not present

## 2018-06-07 DIAGNOSIS — M545 Low back pain: Secondary | ICD-10-CM | POA: Diagnosis not present

## 2018-06-07 DIAGNOSIS — J159 Unspecified bacterial pneumonia: Secondary | ICD-10-CM | POA: Diagnosis not present

## 2018-06-07 DIAGNOSIS — Z79899 Other long term (current) drug therapy: Secondary | ICD-10-CM | POA: Diagnosis not present

## 2018-06-07 DIAGNOSIS — R0602 Shortness of breath: Secondary | ICD-10-CM | POA: Diagnosis not present

## 2018-06-07 DIAGNOSIS — G8929 Other chronic pain: Secondary | ICD-10-CM | POA: Diagnosis not present

## 2018-06-09 ENCOUNTER — Other Ambulatory Visit: Payer: Self-pay | Admitting: Family Medicine

## 2018-06-09 DIAGNOSIS — J449 Chronic obstructive pulmonary disease, unspecified: Secondary | ICD-10-CM | POA: Diagnosis not present

## 2018-06-12 ENCOUNTER — Telehealth: Payer: Self-pay | Admitting: Internal Medicine

## 2018-06-12 NOTE — Telephone Encounter (Signed)
Called patient unable to reach left message to give us a call back.

## 2018-06-13 ENCOUNTER — Other Ambulatory Visit: Payer: Self-pay | Admitting: Internal Medicine

## 2018-06-13 NOTE — Telephone Encounter (Signed)
done

## 2018-06-13 NOTE — Telephone Encounter (Signed)
Pt is calling back [phone] 

## 2018-06-13 NOTE — Telephone Encounter (Signed)
Ok to refill total 6 months 

## 2018-06-13 NOTE — Telephone Encounter (Signed)
CY Please advise on refill. Thanks.  

## 2018-06-13 NOTE — Telephone Encounter (Signed)
Pt is aware that I sent request to CY and will call her when Rx has been taken care of.

## 2018-06-14 NOTE — Telephone Encounter (Signed)
Katie please advise, thank you.

## 2018-06-14 NOTE — Telephone Encounter (Signed)
Noted will close encounter.

## 2018-06-14 NOTE — Telephone Encounter (Signed)
Patient called back to follow up on this message; requesting a call back when the medication has been entered; pt contact # [phone]

## 2018-06-14 NOTE — Telephone Encounter (Signed)
Medication has been called to pharmacy voicemail. Thanks.

## 2018-06-19 ENCOUNTER — Telehealth: Payer: Self-pay | Admitting: Internal Medicine

## 2018-06-19 MED ORDER — PREDNISONE 10 MG PO TABS: 10.0000 mg | ORAL_TABLET | Freq: Every day | ORAL | 0 refills | Status: DC

## 2018-06-19 MED ORDER — AZITHROMYCIN 250 MG PO TABS: ORAL_TABLET | ORAL | 0 refills | Status: DC

## 2018-06-19 NOTE — Telephone Encounter (Signed)
Spoke with patient. She was seen by CY on 10/17 and was advised that she had PNA. She received a zpak and stated that she felt better for a few days. Now, the cough has returned as well as the SOB. She feels like she did when she first had PNA. She wants to know if CY will call in another round of abx and prednisone.   She wishes to use [facility].   CY, please advise. Thanks!    Allergies  Allergen Reactions  . Aspirin Nausea And Vomiting

## 2018-06-19 NOTE — Telephone Encounter (Signed)
Spoke with patient. She is aware of CY's recs. Will go ahead and call in medications for her. Verbalized understanding. Nothing further needed at time of call.

## 2018-06-19 NOTE — Telephone Encounter (Signed)
Offer Zpak  250 mg, # 6, 2 today then one daily           Prednisone 10 mg, # 7, 1 daily

## 2018-06-27 ENCOUNTER — Ambulatory Visit (INDEPENDENT_AMBULATORY_CARE_PROVIDER_SITE_OTHER): Payer: Medicaid Other | Admitting: Internal Medicine

## 2018-06-27 ENCOUNTER — Encounter: Payer: Self-pay | Admitting: Internal Medicine

## 2018-06-27 ENCOUNTER — Ambulatory Visit (INDEPENDENT_AMBULATORY_CARE_PROVIDER_SITE_OTHER)
Admission: RE | Admit: 2018-06-27 | Discharge: 2018-06-27 | Disposition: A | Discharge status: Home / Self Care | Payer: Medicaid Other | Source: Ambulatory Visit | Attending: Internal Medicine | Admitting: Internal Medicine

## 2018-06-27 ENCOUNTER — Ambulatory Visit: Payer: Medicaid Other | Admitting: Internal Medicine

## 2018-06-27 VITALS — BP 114/78 | HR 88 | Ht 62.0 in | Wt 99.0 lb

## 2018-06-27 DIAGNOSIS — Z23 Encounter for immunization: Secondary | ICD-10-CM

## 2018-06-27 DIAGNOSIS — J9611 Chronic respiratory failure with hypoxia: Secondary | ICD-10-CM

## 2018-06-27 DIAGNOSIS — J189 Pneumonia, unspecified organism: Secondary | ICD-10-CM

## 2018-06-27 DIAGNOSIS — J449 Chronic obstructive pulmonary disease, unspecified: Secondary | ICD-10-CM

## 2018-06-27 MED ORDER — UMECLIDINIUM-VILANTEROL 62.5-25 MCG/INH IN AEPB: 1.0000 | INHALATION_SPRAY | Freq: Every day | RESPIRATORY_TRACT | 0 refills | Status: DC

## 2018-06-27 NOTE — Assessment & Plan Note (Signed)
If financing can be arranged, and inhaled steroid like Trelegy might work for her.  I suspect partly what she likes from prednisone is more of a systemic sense of energy.

## 2018-06-27 NOTE — Patient Instructions (Addendum)
Order- pneumovax- 10723  Ok to continue present meds  Please call if we can help  Sample x 2 Anoro inhaler

## 2018-06-27 NOTE — Assessment & Plan Note (Signed)
She remains dependent on oxygen and is using it consistently now.

## 2018-06-27 NOTE — Progress Notes (Signed)
Patient ID: Megan Soto, female    DOB: [birth year], 59 y.o.   MRN: [id number]  HPI  female smoker followed for COPD, complicated by anxiety, DM, GERD, musculoskeletal pain/pain clinic, hepatitis C Walk test on room air 10/11/16 -Room air resting saturation 88%, desaturated to 86% walking on room air, recovered on 3 L to 97% Office Spirometry 10/11/2016-severe obstructive airways disease-FVC 2.30/73%, FEV1 1.21/49%, ratio 0.52 --------------------------------------------------------------------------------------  05/25/2018- 57 year old female smoker followed for COPD, hypoxic respiratory failure, complicated by anxiety, DM, GERD, musculoskeletal pain/pain clinic, hepatitis C O2  2L sleep and prn/ [facility]> [facility] -----COPD; DME [facility]; Pt states she needs new POC from [facility] as her current one is not working well enough. Pt notes cough-blood tinged and chest tightness.  Spiriva HandiHaler, Combivent Respimat, Anoro, pro-air HFA, neb albuterol, Requalified today for POC O2.  Apparently she had gotten the POC from [place].  She says that this POC is "not pumping right". Has reduced smoking-has to go outside to smoke now.  Not sure if they will use wood heater in house this winter.  She has all of her inhaled meds.  Continue Xanax for anxiety-does not need refill now. Increased cough with brownish and streak of blood sputum in the last couple of days.  Denies fever, adenopathy, chest pain.  06/27/2018- 57 year old female smoker followed for COPD, hypoxic respiratory failure, complicated by anxiety, DM, GERD, musculoskeletal pain/pain clinic, hepatitis C O2  2L sleep and prn/ [facility]> [facility] -----COPD: Pt continues to wear O2. Still has SOB with exertion as well.  We had sent Z-Pak and prednisone taper for exacerbation of bronchitis symptoms on 11/11. Spiriva HandiHaler, Combivent Respimat, Anoro, albuterol HFA, neb albuterol, She is often out of medication-currently out of Anoro-due to  finances. Continues to smoke.  Denies acute illness but as she finishes her most recent course of prednisone she is feeling somewhat tighter in the chest.  We discussed steroid side effects and alternatives. CXR 06/27/18-  COPD. No pneumonia, CHF, nor other acute cardiopulmonary abnormality. Thoracic aortic atherosclerosis.  ROS-see HPI   + = positive Constitutional:    weight loss, night sweats, fevers, chills, fatigue, lassitude. HEENT:    headaches, difficulty swallowing, tooth/dental problems, sore throat,       sneezing, itching, ear ache, nasal congestion, post nasal drip, snoring CV:    chest pain, orthopnea, PND, swelling in lower extremities, anasarca,                                               dizziness, palpitations Resp:  + shortness of breath with exertion or at rest.                +productive cough,   + non-productive cough, coughing up of blood.              change in color of mucus.   wheezing.   Skin:    rash or lesions. GI:  No-   heartburn, indigestion, abdominal pain, nausea, vomiting, GU: dysuria, change in color of urine, no urgency or frequency.   flank pain. MS:  + joint pain, stiffness, decreased range of motion, back pain. Neuro-     nothing unusual Psych:  change in mood or affect.  +depression or anxiety.   memory loss.  OBJ- Physical Exam General- Alert, Oriented, Affect-pleasant/  anxious, Distress- none acute, + slender Skin-  rash-none, lesions- none, excoriation- none Lymphadenopathy- none Head- atraumatic            Eyes- Gross vision intact, PERRLA, conjunctivae and secretions clear            Ears- Hearing, canals-normal            Nose- Clear, no-Septal dev, mucus, polyps, erosion, perforation             Throat- Mallampati II , mucosa clear , drainage- none, tonsils- atrophic, + missing teeth ,  Neck- flexible , trachea midline, no stridor , thyroid nl, carotid no bruit Chest - symmetrical excursion , unlabored, no masses           Heart/CV-  RRR , no murmur , no gallop  , no rub, nl s1 s2                           - JVD- none , edema- none, stasis changes- none, varices- none           Lung-  Cough + light, wheeze + trace,  dullness-none, rub- none Abd-  Br/ Gen/ Rectal- Not done, not indicated Extrem- cyanosis- none, clubbing, none, atrophy- none, strength- nl Neuro- grossly intact to observation

## 2018-06-28 NOTE — Progress Notes (Signed)
Spoke with pt and notified of results per Dr. Young Pt verbalized understanding and denied any questions. 

## 2018-06-29 DIAGNOSIS — J449 Chronic obstructive pulmonary disease, unspecified: Secondary | ICD-10-CM | POA: Diagnosis not present

## 2018-07-02 DIAGNOSIS — J449 Chronic obstructive pulmonary disease, unspecified: Secondary | ICD-10-CM | POA: Diagnosis not present

## 2018-07-03 ENCOUNTER — Telehealth: Payer: Self-pay | Admitting: Internal Medicine

## 2018-07-03 MED ORDER — PREDNISONE 10 MG PO TABS: 10.0000 mg | ORAL_TABLET | Freq: Every day | ORAL | 0 refills | Status: DC

## 2018-07-03 NOTE — Telephone Encounter (Signed)
Offer prednisone 10 mg, # 5, 1 daily  Can also take Tylenol for ache or pain, and otc Muvcinex for congestion

## 2018-07-03 NOTE — Telephone Encounter (Signed)
Called and spoke with patient, medications sent. Nothing further needed.

## 2018-07-03 NOTE — Telephone Encounter (Signed)
Called and spoke with patient, she stated that she got her pneumonia shot last Thursday and since then she has had increased SOB, congestion, body aches, and a cough. Patient would like something to be called in. No other symptoms present. Patient denies fever and no sneezing.    CY please advise, thank you.

## 2018-07-04 ENCOUNTER — Telehealth: Payer: Self-pay

## 2018-07-04 NOTE — Telephone Encounter (Signed)
Pt called requesting amoxicillin for a boil. I told pt she will need to make an appt. Pt asked me if she could leave a message for the DR. To give her a call. Please call pt on [phone]. Sunday SpillersSharon T Saunders, CMA

## 2018-07-05 DIAGNOSIS — Z79891 Long term (current) use of opiate analgesic: Secondary | ICD-10-CM | POA: Diagnosis not present

## 2018-07-05 NOTE — Telephone Encounter (Signed)
Called pt. No answer. No ability to leave VM. If pt calls back, pls inform that it is poor care and against clinic policy to Rx abx w/o a visit.

## 2018-07-09 DIAGNOSIS — J449 Chronic obstructive pulmonary disease, unspecified: Secondary | ICD-10-CM | POA: Diagnosis not present

## 2018-07-10 ENCOUNTER — Telehealth: Payer: Self-pay | Admitting: Internal Medicine

## 2018-07-10 NOTE — Telephone Encounter (Signed)
Refill request for Xanax 1 mg/ CY please advise if ok to refill  Last OV 06/27/2018 Last RX 11/6/2M019      Allergies  Allergen Reactions  . Aspirin Nausea And Vomiting

## 2018-07-10 NOTE — Telephone Encounter (Deleted)
Refill request for Xanax 1 mg/ CY please advise if ok to refill  Last OV 06/27/2018 Last RX 06/14/2018  Current Outpatient Medications on File Prior to Visit  Medication Sig Dispense Refill  . albuterol (PROAIR HFA) 108 (90 Base) MCG/ACT inhaler Inhale 2 puffs into the lungs every 6 (six) hours as needed for wheezing or shortness of breath. 2 Inhaler 12  . albuterol (PROVENTIL) (2.5 MG/3ML) 0.083% nebulizer solution Take 3 mLs (2.5 mg total) by nebulization every 6 (six) hours as needed for wheezing or shortness of breath. 75 mL 12  . ALPRAZolam (XANAX) 1 MG tablet TAKE 1 TABLET BY MOUTH 3 TIMES A DAY AS NEEDED FOR ANXIETY 90 tablet 5  . amLODipine (NORVASC) 5 MG tablet Take 1 tablet (5 mg total) by mouth daily. 30 tablet 5  . bisoprolol-hydrochlorothiazide (ZIAC) 5-6.25 MG tablet Take 1 tablet by mouth daily.  2  . cetirizine (ZYRTEC) 10 MG tablet Take 1 tablet (10 mg total) by mouth daily. 10 tablet 12  . COMBIVENT RESPIMAT 20-100 MCG/ACT AERS respimat INHALE 1 PUFF EVERY 6 HOURS AS NEEDED 4 g 11  . cyclobenzaprine (FLEXERIL) 10 MG tablet Take 1 tablet (10 mg total) by mouth 3 (three) times daily as needed for muscle spasms. TAKE 1 TABLET BY MOUTH 3 TIMES DAILY AS NEEDED FOR MUSCLE SPASMS. 90 tablet 3  . fluticasone (FLONASE) 50 MCG/ACT nasal spray Place 2 sprays into both nostrils daily. 16 g 11  . lisinopril (PRINIVIL,ZESTRIL) 40 MG tablet Take 40 mg by mouth daily.    . metFORMIN (GLUCOPHAGE) 500 MG tablet Take 500 mg by mouth 2 (two) times daily with a meal.     . Multiple Vitamin (MULTIVITAMIN WITH MINERALS) TABS Take 1 tablet by mouth daily.    Marland Kitchen. omeprazole (PRILOSEC) 40 MG capsule TAKE 1 CAPSULE BY MOUTH DAILY. 30 capsule 3  . Oxycodone HCl 10 MG TABS Take 1 tablet by mouth 4 (four) times daily.  0  . predniSONE (DELTASONE) 10 MG tablet Take 1 tablet (10 mg total) by mouth daily with breakfast. 5 tablet 0  . SPIRIVA HANDIHALER 18 MCG inhalation capsule PLACE 1 CAPSULE INTO INHALER AND  INHALE ONCE DAILY AS DIRECTED ***KEEP OFFICE VISIT FOR ADDITIONAL REFILLS*** 30 capsule 0  . umeclidinium-vilanterol (ANORO ELLIPTA) 62.5-25 MCG/INH AEPB Inhale 1 puff into the lungs daily. 1 each 3  . umeclidinium-vilanterol (ANORO ELLIPTA) 62.5-25 MCG/INH AEPB Inhale 1 puff into the lungs daily. 2 each 0   No current facility-administered medications on file prior to visit.    Allergies  Allergen Reactions  . Aspirin Nausea And Vomiting

## 2018-07-10 NOTE — Telephone Encounter (Signed)
Ok to refill as before 

## 2018-07-11 MED ORDER — ALPRAZOLAM 1 MG PO TABS: ORAL_TABLET | ORAL | 5 refills | Status: DC

## 2018-07-11 NOTE — Telephone Encounter (Signed)
Rx refill called to pharmacy and pt is aware. Nothing more needed at this time.

## 2018-07-14 ENCOUNTER — Telehealth: Payer: Self-pay | Admitting: Internal Medicine

## 2018-07-14 NOTE — Telephone Encounter (Signed)
Spoke with patient-states she has been bringing up red/darkish brown colored blood tinged phlegm; started yesterday-comes and goes. Amount of phlegm/blood is getting less each time. Pt notes chest congestion. Denies any wheezing or SOB. Pt has appointment with GI on Monday 07-17-18 at 2pm. Pt aware if symptoms get worse or SOB with hemoptysis over the weekend to seek emergency care. Nothing more needed at this time.

## 2018-07-17 ENCOUNTER — Encounter: Payer: Self-pay | Admitting: Gastroenterology

## 2018-07-17 ENCOUNTER — Ambulatory Visit (INDEPENDENT_AMBULATORY_CARE_PROVIDER_SITE_OTHER): Payer: Medicaid Other | Admitting: Gastroenterology

## 2018-07-17 ENCOUNTER — Other Ambulatory Visit (INDEPENDENT_AMBULATORY_CARE_PROVIDER_SITE_OTHER): Payer: Medicaid Other

## 2018-07-17 VITALS — BP 98/64 | HR 76 | Ht 62.0 in | Wt 97.1 lb

## 2018-07-17 DIAGNOSIS — B182 Chronic viral hepatitis C: Secondary | ICD-10-CM

## 2018-07-17 DIAGNOSIS — Z800 Family history of malignant neoplasm of digestive organs: Secondary | ICD-10-CM

## 2018-07-17 DIAGNOSIS — J449 Chronic obstructive pulmonary disease, unspecified: Secondary | ICD-10-CM

## 2018-07-17 DIAGNOSIS — Z9981 Dependence on supplemental oxygen: Secondary | ICD-10-CM | POA: Diagnosis not present

## 2018-07-17 LAB — COMPREHENSIVE METABOLIC PANEL
ALT: 16 U/L (ref 0–35)
AST: 21 U/L (ref 0–37)
Albumin: 4.2 g/dL (ref 3.5–5.2)
Alkaline Phosphatase: 76 U/L (ref 39–117)
BUN: 7 mg/dL (ref 6–23)
CO2: 28 mEq/L (ref 19–32)
Calcium: 9.2 mg/dL (ref 8.4–10.5)
Chloride: 101 mEq/L (ref 96–112)
Creatinine, Ser: 1.03 mg/dL (ref 0.40–1.20)
GFR: 58.22 mL/min — ABNORMAL LOW (ref >60.00)
Glucose, Bld: 141 mg/dL — ABNORMAL HIGH (ref 70–99)
Potassium: 4 mEq/L (ref 3.5–5.1)
Sodium: 138 mEq/L (ref 135–145)
Total Bilirubin: 0.7 mg/dL (ref 0.2–1.2)
Total Protein: 7.2 g/dL (ref 6.0–8.3)

## 2018-07-17 LAB — CBC WITH DIFFERENTIAL/PLATELET
Basophils Absolute: 0 10*3/uL (ref 0.0–0.1)
Basophils Relative: 0.5 % (ref 0.0–3.0)
Eosinophils Absolute: 0.1 10*3/uL (ref 0.0–0.7)
Eosinophils Relative: 0.8 % (ref 0.0–5.0)
HCT: 41.5 % (ref 36.0–46.0)
Hemoglobin: 13.9 g/dL (ref 12.0–15.0)
Lymphocytes Relative: 31.6 % (ref 12.0–46.0)
Lymphs Abs: 2.9 10*3/uL (ref 0.7–4.0)
MCHC: 33.6 g/dL (ref 30.0–36.0)
MCV: 86.9 fl (ref 78.0–100.0)
Monocytes Absolute: 1.1 10*3/uL — ABNORMAL HIGH (ref 0.1–1.0)
Monocytes Relative: 12 % (ref 3.0–12.0)
Neutro Abs: 5 10*3/uL (ref 1.4–7.7)
Neutrophils Relative %: 55.1 % (ref 43.0–77.0)
Platelets: 283 10*3/uL (ref 150.0–400.0)
RBC: 4.78 Mil/uL (ref 3.87–5.11)
RDW: 14.2 % (ref 11.5–15.5)
WBC: 9.1 10*3/uL (ref 4.0–10.5)

## 2018-07-17 LAB — PROTIME-INR
INR: 1.1 ratio — ABNORMAL HIGH (ref 0.8–1.0)
Prothrombin Time: 12.7 s (ref 9.6–13.1)

## 2018-07-17 NOTE — Patient Instructions (Addendum)
If you are age 59 or older, your body mass index should be between 23-30. Your Body mass index is 17.76 kg/m. If this is out of the aforementioned range listed, please consider follow up with your Primary Care Provider.  If you are age 59 or younger, your body mass index should be between 19-25. Your Body mass index is 17.76 kg/m. If this is out of the aformentioned range listed, please consider follow up with your Primary Care Provider.   Please go to the lab in the basement of our building to have lab work done as you leave today. Hit "B" for basement when you get on the elevator.  When the doors open the lab is on your left.  We will call you with the results. Thank you.  You have been scheduled for an abdominal ultrasound at [facility] (1st floor of hospital) on Thursday, Dec. 12th at 8:00am. Please arrive 15 minutes prior to your appointment for registration. Make certain not to have anything to eat or drink 6 hours prior to your appointment. Should you need to reschedule your appointment, please contact radiology at [phone]. This test typically takes about 30 minutes to perform.  We will contact you when Dr. Lanetta InchArmbruster's February hospital schedule opens to schedule your colonoscopy.   We will refer you to Infectious Disease for Hepatitis C therapy. They will contact you for an appointment.   Thank you for entrusting me with your care and for choosing [facility], Dr. Ileene PatrickSteven Armbruster
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
## 2018-07-18 ENCOUNTER — Other Ambulatory Visit: Payer: Self-pay

## 2018-07-18 DIAGNOSIS — B182 Chronic viral hepatitis C: Secondary | ICD-10-CM

## 2018-07-18 LAB — HEPATITIS A ANTIBODY, TOTAL: Hepatitis A AB,Total: REACTIVE — AB

## 2018-07-18 LAB — HEPATITIS B SURFACE ANTIGEN: Hepatitis B Surface Ag: NONREACTIVE

## 2018-07-18 LAB — HEPATITIS B SURFACE ANTIBODY,QUALITATIVE: Hep B S Ab: NONREACTIVE

## 2018-07-18 LAB — AFP TUMOR MARKER: AFP-Tumor Marker: 2.7 ng/mL

## 2018-07-18 NOTE — Progress Notes (Signed)
Orders entered per result note 07-17-18

## 2018-07-20 ENCOUNTER — Ambulatory Visit (HOSPITAL_COMMUNITY): Payer: Medicaid Other | Attending: Gastroenterology

## 2018-07-25 ENCOUNTER — Encounter: Payer: Medicaid Other | Admitting: Internal Medicine

## 2018-07-27 ENCOUNTER — Encounter: Payer: Medicaid Other | Admitting: Internal Medicine

## 2018-07-29 DIAGNOSIS — J449 Chronic obstructive pulmonary disease, unspecified: Secondary | ICD-10-CM | POA: Diagnosis not present

## 2018-07-31 ENCOUNTER — Telehealth: Payer: Self-pay | Admitting: Internal Medicine

## 2018-07-31 MED ORDER — AZITHROMYCIN 250 MG PO TABS: ORAL_TABLET | ORAL | 0 refills | Status: DC

## 2018-07-31 MED ORDER — PREDNISONE 10 MG PO TABS: ORAL_TABLET | ORAL | 0 refills | Status: DC

## 2018-07-31 NOTE — Telephone Encounter (Signed)
Called and spoke with Patient.  She is complaining of a productive cough, yellow mucus, chest congestion, and wheezing.  She stated that she is using her nebs 3-4 times per day.  She said that the nebs does help for a short time with wheezing.  She refused a OV this week, but is requesting antibiotic and prednisone. Her pharmacy is [facility].  Will route to Elisha HeadlandBrian Mack, NP  Allergies  Allergen Reactions  . Aspirin Nausea And Vomiting   Current Outpatient Medications on File Prior to Visit  Medication Sig Dispense Refill  . albuterol (PROAIR HFA) 108 (90 Base) MCG/ACT inhaler Inhale 2 puffs into the lungs every 6 (six) hours as needed for wheezing or shortness of breath. 2 Inhaler 12  . albuterol (PROVENTIL) (2.5 MG/3ML) 0.083% nebulizer solution Take 3 mLs (2.5 mg total) by nebulization every 6 (six) hours as needed for wheezing or shortness of breath. 75 mL 12  . ALPRAZolam (XANAX) 1 MG tablet TAKE 1 TABLET BY MOUTH 3 TIMES A DAY AS NEEDED FOR ANXIETY 90 tablet 5  . amLODipine (NORVASC) 5 MG tablet Take 1 tablet (5 mg total) by mouth daily. 30 tablet 5  . bisoprolol-hydrochlorothiazide (ZIAC) 5-6.25 MG tablet Take 1 tablet by mouth daily.  2  . cetirizine (ZYRTEC) 10 MG tablet Take 1 tablet (10 mg total) by mouth daily. 10 tablet 12  . COMBIVENT RESPIMAT 20-100 MCG/ACT AERS respimat INHALE 1 PUFF EVERY 6 HOURS AS NEEDED 4 g 11  . cyclobenzaprine (FLEXERIL) 10 MG tablet Take 1 tablet (10 mg total) by mouth 3 (three) times daily as needed for muscle spasms. TAKE 1 TABLET BY MOUTH 3 TIMES DAILY AS NEEDED FOR MUSCLE SPASMS. 90 tablet 3  . lisinopril (PRINIVIL,ZESTRIL) 40 MG tablet Take 40 mg by mouth daily.    . metFORMIN (GLUCOPHAGE) 500 MG tablet Take 500 mg by mouth 2 (two) times daily with a meal.     . Multiple Vitamin (MULTIVITAMIN WITH MINERALS) TABS Take 1 tablet by mouth daily.    Marland Kitchen. omeprazole (PRILOSEC) 40 MG capsule TAKE 1 CAPSULE BY MOUTH DAILY. 30 capsule 3  . Oxycodone HCl 10 MG  TABS Take 1 tablet by mouth 4 (four) times daily.  0  . OXYGEN Inhale into the lungs daily.    Marland Kitchen. umeclidinium-vilanterol (ANORO ELLIPTA) 62.5-25 MCG/INH AEPB Inhale 1 puff into the lungs daily. 1 each 3   No current facility-administered medications on file prior to visit.

## 2018-07-31 NOTE — Telephone Encounter (Signed)
Can offer:  Azithromycin 250mg  tablet  >>>Take 2 tablets (500mg  total) today, and then 1 tablet (250mg ) for the next four days  >>>take with food  >>>can also take probiotic and / or yogurt while on antibiotic   Prednisone 10mg  tablet  >>>Take 2 tablets (20 mg total) daily for the next 5 days >>> Take with food in the morning  Please place order  If patient needs additional treatments or additional medication she will need to have a scheduled office visit.   Elisha HeadlandBrian Mack, FNP

## 2018-07-31 NOTE — Telephone Encounter (Signed)
Called and spoke with patient nothing further needed at this time. Order  Placed

## 2018-08-01 DIAGNOSIS — J449 Chronic obstructive pulmonary disease, unspecified: Secondary | ICD-10-CM | POA: Diagnosis not present

## 2018-08-04 DIAGNOSIS — M545 Low back pain: Secondary | ICD-10-CM | POA: Diagnosis not present

## 2018-08-04 DIAGNOSIS — Z79899 Other long term (current) drug therapy: Secondary | ICD-10-CM | POA: Diagnosis not present

## 2018-08-04 DIAGNOSIS — Z79891 Long term (current) use of opiate analgesic: Secondary | ICD-10-CM | POA: Diagnosis not present

## 2018-08-04 DIAGNOSIS — G8929 Other chronic pain: Secondary | ICD-10-CM | POA: Diagnosis not present

## 2018-08-07 ENCOUNTER — Telehealth: Payer: Self-pay

## 2018-08-07 ENCOUNTER — Encounter: Payer: Medicaid Other | Admitting: Infectious Diseases

## 2018-08-07 NOTE — Telephone Encounter (Signed)
Patient [facility]. Called and spoke to pt. She has trouble getting a ride. She was willing to reschedule the appt.  I have rescheduled her for on Thursday, August 17, 2018 [facility] To arrive 15 minutes prior, NPO 6 hours. Pt notified, and have given her the number in case she needs to reschedule if she has problems with her ride. She was reminded to get labs the day of her ultrasound.    She also was reminded to call Infectious Disease and schedule an appt: [phone].

## 2018-08-07 NOTE — Telephone Encounter (Signed)
I called and changed her Hep. B vaccine appointment from today  (we don't have the vaccine) due to fridge malfunction to 08/16/2018. She also has ride issues she stated. She will get her lab work that day as well. She said she missed her appointment with infectious diease department and has lost their #.  I called them and left them a detailed message to call her back to re- schedule. I have passed this information on to Dr Lanetta InchArmbruster's CMA Lucius ConnJan Hogan. She is going to f/u on the U/S that patient no showed.

## 2018-08-09 DIAGNOSIS — J449 Chronic obstructive pulmonary disease, unspecified: Secondary | ICD-10-CM | POA: Diagnosis not present

## 2018-08-10 ENCOUNTER — Telehealth: Payer: Self-pay

## 2018-08-10 NOTE — Telephone Encounter (Signed)
Called and spoke to pt. I Informed her of her colonoscopy at [facility] on 09-12-2018 at 11:30am to arrive at 10:00am.  She is scheduled for her previsit to be instructed  on 08-16-2018 at 4:00pm, the same day that she has her 1st nurse appt for Hep B injection at 3:30pm.  She will need to go to the lab when she is done that day for blood work. Pt is aware and expressed understanding.

## 2018-08-11 DIAGNOSIS — Z79899 Other long term (current) drug therapy: Secondary | ICD-10-CM | POA: Diagnosis not present

## 2018-08-16 ENCOUNTER — Ambulatory Visit (INDEPENDENT_AMBULATORY_CARE_PROVIDER_SITE_OTHER): Payer: Medicaid Other | Admitting: Gastroenterology

## 2018-08-16 ENCOUNTER — Ambulatory Visit (AMBULATORY_SURGERY_CENTER): Payer: Self-pay | Admitting: *Deleted

## 2018-08-16 ENCOUNTER — Other Ambulatory Visit: Payer: Medicaid Other

## 2018-08-16 VITALS — Ht 62.0 in | Wt 97.0 lb

## 2018-08-16 DIAGNOSIS — Z800 Family history of malignant neoplasm of digestive organs: Secondary | ICD-10-CM

## 2018-08-16 DIAGNOSIS — Z23 Encounter for immunization: Secondary | ICD-10-CM

## 2018-08-16 DIAGNOSIS — B182 Chronic viral hepatitis C: Secondary | ICD-10-CM

## 2018-08-16 MED ORDER — NA SULFATE-K SULFATE-MG SULF 17.5-3.13-1.6 GM/177ML PO SOLN: 1.0000 | Freq: Once | ORAL | 0 refills | Status: AC

## 2018-08-16 NOTE — Progress Notes (Signed)
No egg or soy allergy known to patient  No issues with past sedation with any surgeries  or procedures, no intubation problems  No diet pills per patient Pt uses home 02 use per patient - uses 2 Liters  No blood thinners per patient  Pt denies issues with constipation  No A fib or A flutter  EMMI video sent to pt's e mail - pt declined  Daughter in [place] with pt today

## 2018-08-17 ENCOUNTER — Ambulatory Visit (HOSPITAL_COMMUNITY): Payer: Medicaid Other

## 2018-08-17 ENCOUNTER — Ambulatory Visit (HOSPITAL_COMMUNITY): Admission: RE | Admit: 2018-08-17 | Payer: Medicaid Other | Source: Ambulatory Visit

## 2018-08-18 DIAGNOSIS — Z79899 Other long term (current) drug therapy: Secondary | ICD-10-CM | POA: Diagnosis not present

## 2018-08-20 LAB — HEPATITIS C VRS RNA DETECT BY PCR-QUAL: Hepatitis C Vrs RNA by PCR-Qual: DETECTED — AB

## 2018-08-21 ENCOUNTER — Telehealth: Payer: Self-pay | Admitting: Gastroenterology

## 2018-08-21 ENCOUNTER — Other Ambulatory Visit: Payer: Self-pay

## 2018-08-21 DIAGNOSIS — B1920 Unspecified viral hepatitis C without hepatic coma: Secondary | ICD-10-CM

## 2018-08-21 NOTE — Telephone Encounter (Signed)
Pt has some questions regarding procedure at [facility] hospital.

## 2018-08-21 NOTE — Progress Notes (Signed)
.  hcv

## 2018-08-21 NOTE — Telephone Encounter (Signed)
Called and spoke to pt.  She wanted to confirm the date of her colonoscopy at [facility] and the date of her ultrasound (Tuesday, 08-29-18) .  We discussed the dates of each and she wanted to confirm that Dr. Adela Lank is doing her colon.  She also asked me to apologize to the nurse she spoke to earlier Darcey Nora) since she was quite upset about being asked to come back for additional lab work.  I thanked her and told her to let us know if she had any more questions.

## 2018-08-22 ENCOUNTER — Other Ambulatory Visit: Payer: Self-pay | Admitting: Family Medicine

## 2018-08-29 ENCOUNTER — Ambulatory Visit (HOSPITAL_COMMUNITY)
Admission: RE | Admit: 2018-08-29 | Discharge: 2018-08-29 | Disposition: A | Discharge status: Home / Self Care | Payer: Medicaid Other | Source: Ambulatory Visit | Attending: Gastroenterology | Admitting: Gastroenterology

## 2018-08-29 ENCOUNTER — Other Ambulatory Visit: Payer: Medicaid Other

## 2018-08-29 DIAGNOSIS — Z800 Family history of malignant neoplasm of digestive organs: Secondary | ICD-10-CM | POA: Diagnosis not present

## 2018-08-29 DIAGNOSIS — B1920 Unspecified viral hepatitis C without hepatic coma: Secondary | ICD-10-CM | POA: Diagnosis not present

## 2018-08-29 DIAGNOSIS — J449 Chronic obstructive pulmonary disease, unspecified: Secondary | ICD-10-CM | POA: Diagnosis not present

## 2018-08-29 DIAGNOSIS — B182 Chronic viral hepatitis C: Secondary | ICD-10-CM | POA: Diagnosis not present

## 2018-08-30 ENCOUNTER — Other Ambulatory Visit: Payer: Self-pay

## 2018-08-30 DIAGNOSIS — B1920 Unspecified viral hepatitis C without hepatic coma: Secondary | ICD-10-CM

## 2018-08-30 NOTE — Progress Notes (Unsigned)
Placed order for Hep C RNA quant per result note of U/S on 08-29-18

## 2018-09-01 DIAGNOSIS — J449 Chronic obstructive pulmonary disease, unspecified: Secondary | ICD-10-CM | POA: Diagnosis not present

## 2018-09-03 LAB — HCV REAL-TIME, PCR, QUANT
Hepatitis C Quantitation: 4420000 IU/mL
log 10: 6.645 log10 IU/mL

## 2018-09-03 LAB — HCV RNA NAA QUAL RFX TO QUANT: HCV RNA NAA Qualitative: POSITIVE — AB

## 2018-09-09 DIAGNOSIS — J449 Chronic obstructive pulmonary disease, unspecified: Secondary | ICD-10-CM | POA: Diagnosis not present

## 2018-09-12 ENCOUNTER — Encounter (HOSPITAL_COMMUNITY): Payer: Self-pay

## 2018-09-12 ENCOUNTER — Encounter (HOSPITAL_COMMUNITY)
Admission: RE | Disposition: A | Discharge status: Home / Self Care | Payer: Self-pay | Source: Home / Self Care | Attending: Gastroenterology

## 2018-09-12 ENCOUNTER — Ambulatory Visit (HOSPITAL_COMMUNITY): Payer: Medicaid Other | Admitting: Anesthesiology

## 2018-09-12 ENCOUNTER — Other Ambulatory Visit: Payer: Self-pay

## 2018-09-12 ENCOUNTER — Ambulatory Visit (HOSPITAL_COMMUNITY)
Admission: RE | Admit: 2018-09-12 | Discharge: 2018-09-12 | Disposition: A | Discharge status: Home / Self Care | Payer: Medicaid Other | Attending: Gastroenterology | Admitting: Gastroenterology

## 2018-09-12 DIAGNOSIS — M1990 Unspecified osteoarthritis, unspecified site: Secondary | ICD-10-CM | POA: Insufficient documentation

## 2018-09-12 DIAGNOSIS — Z1211 Encounter for screening for malignant neoplasm of colon: Secondary | ICD-10-CM | POA: Diagnosis not present

## 2018-09-12 DIAGNOSIS — F17210 Nicotine dependence, cigarettes, uncomplicated: Secondary | ICD-10-CM | POA: Diagnosis not present

## 2018-09-12 DIAGNOSIS — K219 Gastro-esophageal reflux disease without esophagitis: Secondary | ICD-10-CM | POA: Diagnosis not present

## 2018-09-12 DIAGNOSIS — M797 Fibromyalgia: Secondary | ICD-10-CM | POA: Diagnosis not present

## 2018-09-12 DIAGNOSIS — F329 Major depressive disorder, single episode, unspecified: Secondary | ICD-10-CM | POA: Diagnosis not present

## 2018-09-12 DIAGNOSIS — Z79899 Other long term (current) drug therapy: Secondary | ICD-10-CM | POA: Insufficient documentation

## 2018-09-12 DIAGNOSIS — E119 Type 2 diabetes mellitus without complications: Secondary | ICD-10-CM | POA: Insufficient documentation

## 2018-09-12 DIAGNOSIS — Z800 Family history of malignant neoplasm of digestive organs: Secondary | ICD-10-CM | POA: Insufficient documentation

## 2018-09-12 DIAGNOSIS — Z9981 Dependence on supplemental oxygen: Secondary | ICD-10-CM | POA: Diagnosis not present

## 2018-09-12 DIAGNOSIS — J449 Chronic obstructive pulmonary disease, unspecified: Secondary | ICD-10-CM | POA: Insufficient documentation

## 2018-09-12 DIAGNOSIS — F419 Anxiety disorder, unspecified: Secondary | ICD-10-CM | POA: Insufficient documentation

## 2018-09-12 DIAGNOSIS — K5730 Diverticulosis of large intestine without perforation or abscess without bleeding: Secondary | ICD-10-CM | POA: Insufficient documentation

## 2018-09-12 DIAGNOSIS — Z7984 Long term (current) use of oral hypoglycemic drugs: Secondary | ICD-10-CM | POA: Diagnosis not present

## 2018-09-12 DIAGNOSIS — B1920 Unspecified viral hepatitis C without hepatic coma: Secondary | ICD-10-CM | POA: Insufficient documentation

## 2018-09-12 DIAGNOSIS — I10 Essential (primary) hypertension: Secondary | ICD-10-CM | POA: Insufficient documentation

## 2018-09-12 HISTORY — PX: COLONOSCOPY WITH PROPOFOL: SHX5780

## 2018-09-12 LAB — GLUCOSE, CAPILLARY: Glucose-Capillary: 138 mg/dL — ABNORMAL HIGH (ref 70–99)

## 2018-09-12 SURGERY — COLONOSCOPY WITH PROPOFOL
Anesthesia: Monitor Anesthesia Care

## 2018-09-12 MED ORDER — PROPOFOL 10 MG/ML IV BOLUS
INTRAVENOUS | Status: AC
  Filled 2018-09-12: qty 40

## 2018-09-12 MED ORDER — PROPOFOL 10 MG/ML IV BOLUS
INTRAVENOUS | Status: AC
  Filled 2018-09-12: qty 20

## 2018-09-12 MED ORDER — SODIUM CHLORIDE 0.9 % IV SOLN: INTRAVENOUS | Status: DC

## 2018-09-12 MED ORDER — PROPOFOL 500 MG/50ML IV EMUL
INTRAVENOUS | Status: DC | PRN
  Administered 2018-09-12: 100 ug/kg/min via INTRAVENOUS

## 2018-09-12 MED ORDER — PROPOFOL 10 MG/ML IV BOLUS
INTRAVENOUS | Status: DC | PRN
  Administered 2018-09-12: 40 mg via INTRAVENOUS

## 2018-09-12 MED ORDER — LACTATED RINGERS IV SOLN
INTRAVENOUS | Status: DC
  Administered 2018-09-12: 11:00:00 via INTRAVENOUS

## 2018-09-12 SURGICAL SUPPLY — 22 items

## 2018-09-12 NOTE — Anesthesia Preprocedure Evaluation (Signed)
Anesthesia Evaluation  Patient identified by MRN, date of birth, ID band Patient awake    Reviewed: Allergy & Precautions, NPO status , Patient's Chart, lab work & pertinent test results  History of Anesthesia Complications Negative for: history of anesthetic complications  Airway Mallampati: II  TM Distance: >3 FB Neck ROM: Full    Dental  (+) Poor Dentition, Chipped Multiple chipped and broken teeth with overall poor dentition:   Pulmonary shortness of breath and Long-Term Oxygen Therapy, COPD, Current Smoker,     + decreased breath sounds(-) wheezing      Cardiovascular hypertension, Normal cardiovascular exam Rhythm:Regular Rate:Normal     Neuro/Psych PSYCHIATRIC DISORDERS Anxiety Depression negative neurological ROS     GI/Hepatic GERD  ,(+) Hepatitis -, C  Endo/Other  diabetes, Oral Hypoglycemic Agents  Renal/GU negative Renal ROS  negative genitourinary   Musculoskeletal  (+) Fibromyalgia -  Abdominal   Peds  Hematology negative hematology ROS (+)   Anesthesia Other Findings 58 yo F for screening colonoscopy - PMH: anx/dep, COPD/chronic hypoxic respiratory failure on home O2, current smoker, HTN, GERD, HCV, DM, fibromyalgia  Reproductive/Obstetrics                            Anesthesia Physical Anesthesia Plan  ASA: III  Anesthesia Plan: MAC   Post-op Pain Management:    Induction: Intravenous  PONV Risk Score and Plan: 1 and Propofol infusion, TIVA and Treatment may vary due to age or medical condition  Airway Management Planned: Nasal Cannula and Simple Face Mask  Additional Equipment: None  Intra-op Plan:   Post-operative Plan:   Informed Consent: I have reviewed the patients History and Physical, chart, labs and discussed the procedure including the risks, benefits and alternatives for the proposed anesthesia with the patient or authorized representative who has  indicated his/her understanding and acceptance.       Plan Discussed with:   Anesthesia Plan Comments:         Anesthesia Quick Evaluation

## 2018-09-12 NOTE — Op Note (Signed)
[facility] Patient Name: Megan Soto Procedure Date: 09/12/2018 MRN: [id number] Attending MD: Carlota Raspberry. Havery Moros , MD Date of Birth: 02/14/1961 CSN: [id number] Age: 58 Admit Type: Outpatient Procedure:                Colonoscopy Indications:              Screening in patient at increased risk: Family                            history of 1st-degree relative with colorectal                            cancer (father diagnosed < age 37), This is the                            patient's first colonoscopy Providers:                Remo Lipps P. Havery Moros, MD, Jeanella Cara,                            RN, Elspeth Cho Tech., Technician, Stephanie                            [place] CRNA Referring MD:              Medicines:                Monitored Anesthesia Care Complications:            No immediate complications. Estimated blood loss:                            None. Estimated Blood Loss:     Estimated blood loss: none. Procedure:                Pre-Anesthesia Assessment:                           - Prior to the procedure, a History and Physical                            was performed, and patient medications and                            allergies were reviewed. The patient's tolerance of                            previous anesthesia was also reviewed. The risks                            and benefits of the procedure and the sedation                            options and risks were discussed with the patient.                            All questions were answered, and informed  consent                            was obtained. Prior Anticoagulants: The patient has                            taken no previous anticoagulant or antiplatelet                            agents. ASA Grade Assessment: III - A patient with                            severe systemic disease. After reviewing the risks                            and benefits, the patient was deemed in                             satisfactory condition to undergo the procedure.                           After obtaining informed consent, the colonoscope                            was passed under direct vision. Throughout the                            procedure, the patient's blood pressure, pulse, and                            oxygen saturations were monitored continuously. The                            PCF-H190DL ([id number] Olympus pediatric colonscope                            was introduced through the anus and advanced to the                            the cecum, identified by appendiceal orifice and                            ileocecal valve. The colonoscopy was performed                            without difficulty. The patient tolerated the                            procedure well. The quality of the bowel                            preparation was good. The ileocecal valve,  appendiceal orifice, and rectum were photographed. Scope In: 10:50:02 AM Scope Out: 11:13:58 AM Scope Withdrawal Time: 0 hours 20 minutes 5 seconds  Total Procedure Duration: 0 hours 23 minutes 56 seconds  Findings:      The perianal and digital rectal examinations were normal.      A few small-mouthed diverticula were found in the sigmoid colon.      The exam was otherwise without abnormality. No polyps. Impression:               - Diverticulosis in the sigmoid colon.                           - The examination was otherwise normal.                           - No polyps. Moderate Sedation:      No moderate sedation, case performed with MAC Recommendation:           - Patient has a contact number available for                            emergencies. The signs and symptoms of potential                            delayed complications were discussed with the                            patient. Return to normal activities tomorrow.                            Written discharge  instructions were provided to the                            patient.                           - Resume previous diet.                           - Continue present medications.                           - Repeat colonoscopy in 5 years for screening                            purposes given strong family history of colon                            cancer. Procedure Code(s):        --- Professional ---                           [phone], Colonoscopy, flexible; diagnostic, including                            collection of specimen(s) by brushing or washing,  when performed (separate procedure) Diagnosis Code(s):        --- Professional ---                           Z80.0, Family history of malignant neoplasm of                            digestive organs                           K57.30, Diverticulosis of large intestine without                            perforation or abscess without bleeding CPT copyright 2018 American Medical Association. All rights reserved. The codes documented in this report are preliminary and upon coder review may  be revised to meet current compliance requirements. Remo Lipps P. Kijana Cromie, MD 09/12/2018 11:18:43 AM This report has been signed electronically. Number of Addenda: 0
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
## 2018-09-12 NOTE — Discharge Instructions (Signed)
Colonoscopy, Adult, Care After °This sheet gives you information about how to care for yourself after your procedure. Your doctor may also give you more specific instructions. If you have problems or questions, call your doctor. °What can I expect after the procedure? °After the procedure, it is common to have: °· A small amount of blood in your poop for 24 hours. °· Some gas. °· Mild cramping or bloating in your belly. °Follow these instructions at home: °General instructions °· For the first 24 hours after the procedure: °? Do not drive or use machinery. °? Do not sign important documents. °? Do not drink alcohol. °? Do your daily activities more slowly than normal. °? Eat foods that are soft and easy to digest. °· Take over-the-counter or prescription medicines only as told by your doctor. °To help cramping and bloating: ° °· Try walking around. °· Put heat on your belly (abdomen) as told by your doctor. Use a heat source that your doctor recommends, such as a moist heat pack or a heating pad. °? Put a towel between your skin and the heat source. °? Leave the heat on for 20-30 minutes. °? Remove the heat if your skin turns bright red. This is especially important if you cannot feel pain, heat, or cold. You can get burned. °Eating and drinking ° °· Drink enough fluid to keep your pee (urine) clear or pale yellow. °· Return to your normal diet as told by your doctor. Avoid heavy or fried foods that are hard to digest. °· Avoid drinking alcohol for as long as told by your doctor. °Contact a doctor if: °· You have blood in your poop (stool) 2-3 days after the procedure. °Get help right away if: °· You have more than a small amount of blood in your poop. °· You see large clumps of tissue (blood clots) in your poop. °· Your belly is swollen. °· You feel sick to your stomach (nauseous). °· You throw up (vomit). °· You have a fever. °· You have belly pain that gets worse, and medicine does not help your  pain. °Summary °· After the procedure, it is common to have a small amount of blood in your poop. You may also have mild cramping and bloating in your belly. °· For the first 24 hours after the procedure, do not drive or use machinery, do not sign important documents, and do not drink alcohol. °· Get help right away if you have a lot of blood in your poop, feel sick to your stomach, have a fever, or have more belly pain. °This information is not intended to replace advice given to you by your health care provider. Make sure you discuss any questions you have with your health care provider. °Document Released: 08/28/2010 Document Revised: 05/26/2017 Document Reviewed: 04/19/2016 °Elsevier Interactive Patient Education © 2019 Elsevier Inc. ° °

## 2018-09-12 NOTE — Transfer of Care (Signed)
Immediate Anesthesia Transfer of Care Note  Patient: Megan Soto  Procedure(s) Performed: COLONOSCOPY WITH PROPOFOL (N/A )  Patient Location: PACU and Endoscopy Unit  Anesthesia Type:MAC  Level of Consciousness: awake, alert  and oriented  Airway & Oxygen Therapy: Patient Spontanous Breathing  Post-op Assessment: Report given to RN and Post -op Vital signs reviewed and stable  Post vital signs: Reviewed and stable  Last Vitals:  Vitals Value Taken Time  BP    Temp    Pulse 86 09/12/2018 11:19 AM  Resp 13 09/12/2018 11:19 AM  SpO2 99 % 09/12/2018 11:19 AM  Vitals shown include unvalidated device data.  Last Pain:  Vitals:   09/12/18 0958  TempSrc: Oral  PainSc: 8          Complications: No apparent anesthesia complications

## 2018-09-12 NOTE — Anesthesia Postprocedure Evaluation (Signed)
Anesthesia Post Note  Patient: Megan Soto  Procedure(s) Performed: COLONOSCOPY WITH PROPOFOL (N/A )     Patient location during evaluation: PACU Anesthesia Type: MAC Level of consciousness: awake and alert Pain management: pain level controlled Vital Signs Assessment: post-procedure vital signs reviewed and stable Respiratory status: spontaneous breathing, nonlabored ventilation and respiratory function stable Cardiovascular status: blood pressure returned to baseline and stable Postop Assessment: no apparent nausea or vomiting Anesthetic complications: no    Last Vitals:  Vitals:   09/12/18 1119 09/12/18 1130  BP: 104/72 122/78  Pulse: 86 80  Resp: 13 19  Temp: 36.4 C   SpO2: 99% 100%    Last Pain:  Vitals:   09/12/18 1119  TempSrc: Axillary  PainSc:                  Lidia Collum

## 2018-09-12 NOTE — Interval H&P Note (Signed)
History and Physical Interval Note:  09/12/2018 10:45 AM  Megan Soto  has presented today for surgery, with the diagnosis of colon ca screening/Fam Hx of colon ca/jmh (pt on O2)  The various methods of treatment have been discussed with the patient and family. After consideration of risks, benefits and other options for treatment, the patient has consented to  Procedure(s): COLONOSCOPY WITH PROPOFOL (N/A) as a surgical intervention .  The patient's history has been reviewed, patient examined, no change in status, stable for surgery.  I have reviewed the patient's chart and labs.  Questions were answered to the patient's satisfaction.     Viviann Spare P Alexx Giambra

## 2018-09-13 ENCOUNTER — Encounter (HOSPITAL_COMMUNITY): Payer: Self-pay | Admitting: Gastroenterology

## 2018-09-15 DIAGNOSIS — Z79899 Other long term (current) drug therapy: Secondary | ICD-10-CM | POA: Diagnosis not present

## 2018-09-18 ENCOUNTER — Ambulatory Visit (INDEPENDENT_AMBULATORY_CARE_PROVIDER_SITE_OTHER): Payer: Medicaid Other | Admitting: Gastroenterology

## 2018-09-18 ENCOUNTER — Other Ambulatory Visit: Payer: Self-pay | Admitting: Gastroenterology

## 2018-09-18 DIAGNOSIS — K7460 Unspecified cirrhosis of liver: Secondary | ICD-10-CM

## 2018-09-18 DIAGNOSIS — Z23 Encounter for immunization: Secondary | ICD-10-CM | POA: Diagnosis not present

## 2018-09-29 DIAGNOSIS — J449 Chronic obstructive pulmonary disease, unspecified: Secondary | ICD-10-CM | POA: Diagnosis not present

## 2018-10-02 DIAGNOSIS — J449 Chronic obstructive pulmonary disease, unspecified: Secondary | ICD-10-CM | POA: Diagnosis not present

## 2018-10-06 ENCOUNTER — Other Ambulatory Visit: Payer: Self-pay | Admitting: Internal Medicine

## 2018-10-08 DIAGNOSIS — J449 Chronic obstructive pulmonary disease, unspecified: Secondary | ICD-10-CM | POA: Diagnosis not present

## 2018-10-09 ENCOUNTER — Telehealth: Payer: Self-pay | Admitting: Internal Medicine

## 2018-10-09 MED ORDER — AZITHROMYCIN 250 MG PO TABS: ORAL_TABLET | ORAL | 0 refills | Status: DC

## 2018-10-09 NOTE — Telephone Encounter (Signed)
Offer Zpak 250 mg, # 6, 2 today thein one daily  Ok to use an Charity fundraiser slot

## 2018-10-09 NOTE — Telephone Encounter (Signed)
Called and spoke with pt who stated she has had a head cold x3 days now, coughing up green phlegm and states her breathing has been worse due to this. Pt is requesting something to be sent in to help with her symptoms as she wants to try to get this under control before it turns into pna.  Pt also stated that she needs to schedule a f/u appt with CY. Looked at last OV 06/27/2018 and saw that CY stated for pt to f/u in 3 months which would be around 10/26/2018.  There are no slots open unless an RNA slot is used for pt's appt.  CY, please advise if we can send in meds to help with pt's symptoms and also if it is okay to use a RNA slot to schedule pt a f/u appt with you. Thanks!  Allergies  Allergen Reactions  . Aspirin Nausea And Vomiting     Current Outpatient Medications:  .  albuterol (PROAIR HFA) 108 (90 Base) MCG/ACT inhaler, Inhale 2 puffs into the lungs every 6 (six) hours as needed for wheezing or shortness of breath., Disp: 2 Inhaler, Rfl: 12 .  albuterol (PROVENTIL) (2.5 MG/3ML) 0.083% nebulizer solution, Take 3 mLs (2.5 mg total) by nebulization every 6 (six) hours as needed for wheezing or shortness of breath., Disp: 75 mL, Rfl: 12 .  ALPRAZolam (XANAX) 1 MG tablet, TAKE 1 TABLET BY MOUTH 3 TIMES A DAY AS NEEDED FOR ANXIETY (Patient taking differently: Take 1 mg by mouth 3 (three) times daily as needed for anxiety. ), Disp: 90 tablet, Rfl: 5 .  amLODipine (NORVASC) 5 MG tablet, Take 1 tablet (5 mg total) by mouth daily., Disp: 30 tablet, Rfl: 5 .  ANORO ELLIPTA 62.5-25 MCG/INH AEPB, INHALE 1 PUFF INTO THE LUNGS DAILY., Disp: 60 each, Rfl: 1 .  azithromycin (ZITHROMAX) 250 MG tablet, Take two tablets now, then take 1 tablet daily for 4 days., Disp: 6 tablet, Rfl: 0 .  bisoprolol-hydrochlorothiazide (ZIAC) 5-6.25 MG tablet, Take 1 tablet by mouth daily., Disp: , Rfl: 2 .  cetirizine (ZYRTEC) 10 MG tablet, Take 1 tablet (10 mg total) by mouth daily., Disp: 10 tablet, Rfl: 12 .  COMBIVENT  RESPIMAT 20-100 MCG/ACT AERS respimat, INHALE 1 PUFF EVERY 6 HOURS AS NEEDED, Disp: 4 g, Rfl: 11 .  cyclobenzaprine (FLEXERIL) 10 MG tablet, TAKE 1 TABLET BY MOUTH 3 TIMES DAILY AS NEEDED FOR MUSCLE SPASMS, Disp: 90 tablet, Rfl: 3 .  lisinopril (PRINIVIL,ZESTRIL) 20 MG tablet, , Disp: , Rfl: 5 .  lisinopril (PRINIVIL,ZESTRIL) 40 MG tablet, Take 40 mg by mouth daily., Disp: , Rfl:  .  metFORMIN (GLUCOPHAGE) 500 MG tablet, Take 500 mg by mouth 2 (two) times daily with a meal. , Disp: , Rfl:  .  Multiple Vitamin (MULTIVITAMIN WITH MINERALS) TABS, Take 1 tablet by mouth daily., Disp: , Rfl:  .  omeprazole (PRILOSEC) 40 MG capsule, TAKE 1 CAPSULE BY MOUTH DAILY., Disp: 30 capsule, Rfl: 3 .  Oxycodone HCl 10 MG TABS, Take 1 tablet by mouth 4 (four) times daily., Disp: , Rfl: 0 .  OXYGEN, Inhale into the lungs daily., Disp: , Rfl:  .  predniSONE (DELTASONE) 10 MG tablet, Take 2 tablets daily for the next 5 days., Disp: 10 tablet, Rfl: 0

## 2018-10-09 NOTE — Telephone Encounter (Signed)
Patient would like the Zpack but will have to call back to schedule the f/u apt. Nothing further needed at this time.

## 2018-10-11 DIAGNOSIS — Z681 Body mass index (BMI) 19 or less, adult: Secondary | ICD-10-CM | POA: Diagnosis not present

## 2018-10-11 DIAGNOSIS — Z79899 Other long term (current) drug therapy: Secondary | ICD-10-CM | POA: Diagnosis not present

## 2018-10-11 DIAGNOSIS — Z79891 Long term (current) use of opiate analgesic: Secondary | ICD-10-CM | POA: Diagnosis not present

## 2018-10-11 DIAGNOSIS — M545 Low back pain: Secondary | ICD-10-CM | POA: Diagnosis not present

## 2018-10-11 DIAGNOSIS — G8929 Other chronic pain: Secondary | ICD-10-CM | POA: Diagnosis not present

## 2018-10-28 DIAGNOSIS — J449 Chronic obstructive pulmonary disease, unspecified: Secondary | ICD-10-CM | POA: Diagnosis not present

## 2018-10-30 ENCOUNTER — Other Ambulatory Visit: Payer: Self-pay

## 2018-10-30 MED ORDER — BISOPROLOL-HYDROCHLOROTHIAZIDE 5-6.25 MG PO TABS: 1.0000 | ORAL_TABLET | Freq: Every day | ORAL | 2 refills | Status: DC

## 2018-10-31 DIAGNOSIS — J449 Chronic obstructive pulmonary disease, unspecified: Secondary | ICD-10-CM | POA: Diagnosis not present

## 2018-11-08 DIAGNOSIS — J449 Chronic obstructive pulmonary disease, unspecified: Secondary | ICD-10-CM | POA: Diagnosis not present

## 2018-11-08 DIAGNOSIS — Z79899 Other long term (current) drug therapy: Secondary | ICD-10-CM | POA: Diagnosis not present

## 2018-11-10 ENCOUNTER — Telehealth: Payer: Self-pay | Admitting: Internal Medicine

## 2018-11-10 MED ORDER — AZITHROMYCIN 250 MG PO TABS: ORAL_TABLET | ORAL | 0 refills | Status: DC

## 2018-11-10 NOTE — Telephone Encounter (Signed)
Offer Zpak    250 mg, # 6, 2 today then one daily 

## 2018-11-10 NOTE — Telephone Encounter (Signed)
Primary Pulmonologist: CY Last office visit and with whom: 06/27/18 with CY What do we see them for (pulmonary problems): COPD, history of PNA, asthma and allergic rhinitis Last OV assessment/plan:   Return in about 4 months (around 10/26/2018).  Order- pneumovax- 15  Ok to continue present meds  Please call if we can help  Sample x 2 Anoro inhale      Was appointment offered to patient (explain)?  Offered a televisit for patient since she is due for a follow up. She denied due to her taking care of her infant granddaughter.    Reason for call: Patient stated she has been experiencing some increased SOB over the past few days. She also has a productive cough with yellowish, greenish phlegm. Denied any fevers, chills or body aches. But did state that her chest felt tight. She stated that she has a history of developing PNA and wants to have something called in before it turns into PNA.   Patient's pharmacy is [facility].   CY, please advise. Thanks!

## 2018-11-10 NOTE — Telephone Encounter (Signed)
Returned call to patient. Offered Zpak in which she agreed. Rx sent pharmacy on file. Nothing further needed.

## 2018-11-24 ENCOUNTER — Other Ambulatory Visit: Payer: Self-pay | Admitting: Internal Medicine

## 2018-11-28 ENCOUNTER — Telehealth: Payer: Self-pay | Admitting: Internal Medicine

## 2018-11-28 DIAGNOSIS — J449 Chronic obstructive pulmonary disease, unspecified: Secondary | ICD-10-CM | POA: Diagnosis not present

## 2018-11-28 MED ORDER — ALPRAZOLAM 1 MG PO TABS: ORAL_TABLET | ORAL | 5 refills | Status: DC

## 2018-11-28 NOTE — Telephone Encounter (Signed)
Pt calling requesting refill of xanax. Pt last seen at office 06/27/18, med last filled for pt 07/11/18 #90tabs with 5 RF.  Dr. Maple Hudson, please advise if you are okay with refilling med for pt. Thanks!  Allergies  Allergen Reactions  . Aspirin Nausea And Vomiting     Current Outpatient Medications:  .  albuterol (PROAIR HFA) 108 (90 Base) MCG/ACT inhaler, Inhale 2 puffs into the lungs every 6 (six) hours as needed for wheezing or shortness of breath., Disp: 2 Inhaler, Rfl: 12 .  albuterol (PROVENTIL) (2.5 MG/3ML) 0.083% nebulizer solution, Take 3 mLs (2.5 mg total) by nebulization every 6 (six) hours as needed for wheezing or shortness of breath., Disp: 75 mL, Rfl: 12 .  ALPRAZolam (XANAX) 1 MG tablet, TAKE 1 TABLET BY MOUTH 3 TIMES A DAY AS NEEDED FOR ANXIETY (Patient taking differently: Take 1 mg by mouth 3 (three) times daily as needed for anxiety. ), Disp: 90 tablet, Rfl: 5 .  amLODipine (NORVASC) 5 MG tablet, Take 1 tablet (5 mg total) by mouth daily., Disp: 30 tablet, Rfl: 5 .  ANORO ELLIPTA 62.5-25 MCG/INH AEPB, INHALE 1 PUFF INTO THE LUNGS DAILY., Disp: 60 each, Rfl: 1 .  azithromycin (ZITHROMAX) 250 MG tablet, Take two tablets now, then take 1 tablet daily for 4 days. Dx:J44.9, Disp: 6 tablet, Rfl: 0 .  bisoprolol-hydrochlorothiazide (ZIAC) 5-6.25 MG tablet, Take 1 tablet by mouth daily., Disp: 30 tablet, Rfl: 2 .  cetirizine (ZYRTEC) 10 MG tablet, Take 1 tablet (10 mg total) by mouth daily., Disp: 10 tablet, Rfl: 12 .  COMBIVENT RESPIMAT 20-100 MCG/ACT AERS respimat, INHALE 1 PUFF EVERY 6 HOURS AS NEEDED, Disp: 4 g, Rfl: 11 .  cyclobenzaprine (FLEXERIL) 10 MG tablet, TAKE 1 TABLET BY MOUTH 3 TIMES DAILY AS NEEDED FOR MUSCLE SPASMS, Disp: 90 tablet, Rfl: 3 .  lisinopril (PRINIVIL,ZESTRIL) 20 MG tablet, , Disp: , Rfl: 5 .  lisinopril (PRINIVIL,ZESTRIL) 40 MG tablet, Take 40 mg by mouth daily., Disp: , Rfl:  .  metFORMIN (GLUCOPHAGE) 500 MG tablet, Take 500 mg by mouth 2 (two) times daily  with a meal. , Disp: , Rfl:  .  Multiple Vitamin (MULTIVITAMIN WITH MINERALS) TABS, Take 1 tablet by mouth daily., Disp: , Rfl:  .  omeprazole (PRILOSEC) 40 MG capsule, TAKE 1 CAPSULE BY MOUTH DAILY., Disp: 30 capsule, Rfl: 3 .  Oxycodone HCl 10 MG TABS, Take 1 tablet by mouth 4 (four) times daily., Disp: , Rfl: 0 .  OXYGEN, Inhale into the lungs daily., Disp: , Rfl:  .  predniSONE (DELTASONE) 10 MG tablet, Take 2 tablets daily for the next 5 days., Disp: 10 tablet, Rfl: 0

## 2018-11-28 NOTE — Telephone Encounter (Signed)
Called and spoke with pt who stated she has been having problems with her neb machine. Stated at first the machine would not come on and then when pt was finally able to use it, it would work and then cut off again and was not working properly at all. Pt stated that her machine is an older nebulizer machine. Pt is requesting an order to be placed for a new nebulizer. Dr. Maple Hudson, please advise if you are okay with this being done. Thanks!

## 2018-11-28 NOTE — Telephone Encounter (Signed)
Alprazolam refill e-sent, with instruction to pharmacy that this script should cover for 6 months, with no early refill.

## 2018-11-28 NOTE — Telephone Encounter (Signed)
Called and spoke with pt letting her know this had been done. Pt expressed understanding. Nothing further needed. 

## 2018-11-28 NOTE — Telephone Encounter (Signed)
ATC pt, no answer. Left message for pt to call back.  

## 2018-11-28 NOTE — Telephone Encounter (Signed)
Ok to order replacement compressor nebulizer misc and albuterol 0.083 neb solution (ok to refill current Rx) through a DME for dx COPD mixed type

## 2018-11-29 NOTE — Telephone Encounter (Signed)
Left message for patient to call back  

## 2018-11-29 NOTE — Telephone Encounter (Signed)
Patient is returning phone call.  Patient phone number is [phone].

## 2018-11-29 NOTE — Telephone Encounter (Signed)
Returned call to patient. She states her nebulizer is not working properly. She would like an order placed for another one. Order placed Nothing further needed.

## 2018-11-30 DIAGNOSIS — J449 Chronic obstructive pulmonary disease, unspecified: Secondary | ICD-10-CM | POA: Diagnosis not present

## 2018-12-01 DIAGNOSIS — J449 Chronic obstructive pulmonary disease, unspecified: Secondary | ICD-10-CM | POA: Diagnosis not present

## 2018-12-07 DIAGNOSIS — Z79891 Long term (current) use of opiate analgesic: Secondary | ICD-10-CM | POA: Diagnosis not present

## 2018-12-08 DIAGNOSIS — J449 Chronic obstructive pulmonary disease, unspecified: Secondary | ICD-10-CM | POA: Diagnosis not present

## 2018-12-11 ENCOUNTER — Other Ambulatory Visit: Payer: Self-pay | Admitting: Family Medicine

## 2018-12-25 ENCOUNTER — Other Ambulatory Visit: Payer: Self-pay | Admitting: Family Medicine

## 2018-12-25 ENCOUNTER — Ambulatory Visit: Payer: Medicaid Other | Admitting: Internal Medicine

## 2018-12-28 DIAGNOSIS — J449 Chronic obstructive pulmonary disease, unspecified: Secondary | ICD-10-CM | POA: Diagnosis not present

## 2018-12-31 DIAGNOSIS — J449 Chronic obstructive pulmonary disease, unspecified: Secondary | ICD-10-CM | POA: Diagnosis not present

## 2019-01-05 DIAGNOSIS — Z79899 Other long term (current) drug therapy: Secondary | ICD-10-CM | POA: Diagnosis not present

## 2019-01-08 DIAGNOSIS — J449 Chronic obstructive pulmonary disease, unspecified: Secondary | ICD-10-CM | POA: Diagnosis not present

## 2019-01-12 ENCOUNTER — Telehealth: Payer: Self-pay | Admitting: Internal Medicine

## 2019-01-12 MED ORDER — AZITHROMYCIN 250 MG PO TABS: ORAL_TABLET | ORAL | 0 refills | Status: DC

## 2019-01-12 MED ORDER — PREDNISONE 10 MG PO TABS: ORAL_TABLET | ORAL | 0 refills | Status: DC

## 2019-01-12 NOTE — Telephone Encounter (Signed)
Called & spoke w/ pt regarding BPM's recommendations. Pt verbalized understanding with no additional questions. Orders have been placed. Nothing further needed at this time.

## 2019-01-12 NOTE — Telephone Encounter (Signed)
Primary Pulmonologist: CY Last office visit and with whom: 06/27/2018 w/ CY What do we see them for (pulmonary problems): COPD mixed type  Reason for call: Pt calling having what she describes as an "allergy flare." I observed pt to be very hoarse over the phone. Pt states she's having SOB, chest tightness, cough w/ clear-yellow mucus, and mild wheeze. Denies fever/chills/sweats. Pt states she is taking all of her breathing medications as directed. Pt states she usually is prescribed Zpak and prednisone when these flare-ups occur.   In the last month, have you been in contact with someone who was confirmed or suspected to have Conoravirus / COVID-19?  No  Do you have any of the following symptoms developed in the last 30 days? Fever: No Cough: Yes Shortness of breath: Yes  When did your symptoms start?  This morning 01/12/2019  If the patient has a fever, what is the last reading?  (use n/a if patient denies fever)  No . IF THE PATIENT STATES THEY DO NOT OWN A THERMOMETER, THEY MUST GO AND PURCHASE ONE When did the fever start?: N/A Have you taken any medication to suppress a fever (ie Ibuprofen, Aleve, Tylenol)?: N/A  BPM, please advise on your recommendations for this pt. Thank you.

## 2019-01-12 NOTE — Telephone Encounter (Signed)
OK to send in Z pack  OK to send in prednisone  Prednisone 10 mg tablet Take four tablets for two days, take three tablets for two days, take two tablets for two days, take one tablet for two days, then stop #20 tablets   If symptoms worsen she'll need to present to an emergency room or contact her office for further evaluation  Elisha Headland FNP

## 2019-01-28 DIAGNOSIS — J449 Chronic obstructive pulmonary disease, unspecified: Secondary | ICD-10-CM | POA: Diagnosis not present

## 2019-01-30 ENCOUNTER — Other Ambulatory Visit: Payer: Self-pay | Admitting: Internal Medicine

## 2019-01-31 DIAGNOSIS — J449 Chronic obstructive pulmonary disease, unspecified: Secondary | ICD-10-CM | POA: Diagnosis not present

## 2019-02-02 DIAGNOSIS — Z79899 Other long term (current) drug therapy: Secondary | ICD-10-CM | POA: Diagnosis not present

## 2019-02-06 ENCOUNTER — Ambulatory Visit: Payer: Medicaid Other | Admitting: Internal Medicine

## 2019-02-07 DIAGNOSIS — J449 Chronic obstructive pulmonary disease, unspecified: Secondary | ICD-10-CM | POA: Diagnosis not present

## 2019-02-09 ENCOUNTER — Telehealth: Payer: Self-pay | Admitting: Student in an Organized Health Care Education/Training Program

## 2019-02-09 NOTE — Telephone Encounter (Signed)
Patient calls emergency line for concern about mouth pain/infection. She states that she has poor dentition that is cutting her lips and has some minor bleeding. There is also swelling of her lips. Her tongue has bumps on it and is burning. She has not been to a dentist because they will not see her without "antibiotics".  She is having difficulty eating 2/2 pain. She is not having difficulty swallowing or talking. She has poor respiratory status and is on oxygen at baseline but denies any difficulty with inspiration 2/2 swelling of mouth or throat. She has no rashes or fever but has been sweating.  She asked me for an antibiotic. I did not feel comfortable prescribing without exam so asked her to present to emergency department. She declined as she does not have transportation. At this time, she hung up on me. I called back and asked if she could call 911 for ambulance transportation. She declined this as the ambulance would be "too hot" and she wouldn't be able to handle that ride and going outside. I then offered to make her an appointment at the clinic Monday but she would not have transportation so she agreed to a virtual visit.  I have scheduled her for virtual visit on Monday at the clinic and she was satisfied with this plan. She had no other questions at this time.

## 2019-02-12 ENCOUNTER — Telehealth (INDEPENDENT_AMBULATORY_CARE_PROVIDER_SITE_OTHER): Payer: Self-pay | Admitting: Family Medicine

## 2019-02-12 ENCOUNTER — Other Ambulatory Visit: Payer: Self-pay

## 2019-02-12 ENCOUNTER — Telehealth: Payer: Self-pay | Admitting: Family Medicine

## 2019-02-12 NOTE — Progress Notes (Signed)
See telephone note.

## 2019-02-12 NOTE — Telephone Encounter (Signed)
Called pt for telemedicine visit regarding possible oral infection.  Patient said she was feeling better and did not need to continue with an appointment.  Patient had significant coughing heard over the phone but she says this is her baseline due to copd.

## 2019-02-19 ENCOUNTER — Other Ambulatory Visit: Payer: Self-pay | Admitting: Internal Medicine

## 2019-02-20 ENCOUNTER — Encounter: Payer: Self-pay | Admitting: Internal Medicine

## 2019-02-20 ENCOUNTER — Ambulatory Visit (INDEPENDENT_AMBULATORY_CARE_PROVIDER_SITE_OTHER): Payer: Medicaid Other | Admitting: Internal Medicine

## 2019-02-20 ENCOUNTER — Other Ambulatory Visit: Payer: Self-pay

## 2019-02-20 VITALS — BP 112/68 | HR 90 | Temp 97.5°F | Ht 61.0 in | Wt 94.2 lb

## 2019-02-20 DIAGNOSIS — J9611 Chronic respiratory failure with hypoxia: Secondary | ICD-10-CM

## 2019-02-20 DIAGNOSIS — J441 Chronic obstructive pulmonary disease with (acute) exacerbation: Secondary | ICD-10-CM

## 2019-02-20 DIAGNOSIS — Z720 Tobacco use: Secondary | ICD-10-CM

## 2019-02-20 DIAGNOSIS — J449 Chronic obstructive pulmonary disease, unspecified: Secondary | ICD-10-CM

## 2019-02-20 MED ORDER — AZITHROMYCIN 250 MG PO TABS: ORAL_TABLET | ORAL | 0 refills | Status: DC

## 2019-02-20 MED ORDER — METHYLPREDNISOLONE ACETATE 80 MG/ML IJ SUSP
80.0000 mg | Freq: Once | INTRAMUSCULAR | Status: AC
  Administered 2019-02-20: 12:00:00 80 mg via INTRAMUSCULAR

## 2019-02-20 MED ORDER — PREDNISONE 10 MG PO TABS: ORAL_TABLET | ORAL | 0 refills | Status: DC

## 2019-02-20 NOTE — Assessment & Plan Note (Signed)
I pressed her again to work toward smoking cessation and offered support.

## 2019-02-20 NOTE — Assessment & Plan Note (Signed)
She remains dependent on oxygen for sleep and exertion.

## 2019-02-20 NOTE — Assessment & Plan Note (Signed)
Chronic bronchitis at baseline in this persistent smoker. Plan- Depo 80, prednisone taper and Zpak to hold.

## 2019-02-20 NOTE — Patient Instructions (Signed)
Order- depo 80    Dx COPD exacerbation  Scripts sent to hold until needed for Zpak and prednisone taper  Please keep trying really hard to stop smoking  Keep on the oxygen 2l for sleep and as needed in the daytime.

## 2019-02-20 NOTE — Progress Notes (Signed)
Patient ID: Megan Soto, female    DOB: Apr 26, 1961, 58 y.o.   MRN: [id number]  HPI  female smoker followed for COPD, complicated by anxiety, DM, GERD, musculoskeletal pain/pain clinic, hepatitis C Walk test on room air 10/11/16 -Room air resting saturation 88%, desaturated to 86% walking on room air, recovered on 3 L to 97% Office Spirometry 10/11/2016-severe obstructive airways disease-FVC 2.30/73%, FEV1 1.21/49%, ratio 0.52 --------------------------------------------------------------------------------------  06/27/2018- 58 year old female smoker followed for COPD, hypoxic respiratory failure, complicated by anxiety, DM, GERD, musculoskeletal pain/pain clinic, hepatitis C O2  2L sleep and prn/ Advanced> LIncare -----COPD: Pt continues to wear O2. Still has SOB with exertion as well.  We had sent Z-Pak and prednisone taper for exacerbation of bronchitis symptoms on 11/11. Spiriva HandiHaler, Combivent Respimat, Anoro, albuterol HFA, neb albuterol, She is often out of medication-currently out of Anoro-due to finances. Continues to smoke.  Denies acute illness but as she finishes her most recent course of prednisone she is feeling somewhat tighter in the chest.  We discussed steroid side effects and alternatives. CXR 06/27/18-  COPD. No pneumonia, CHF, nor other acute cardiopulmonary abnormality. Thoracic aortic atherosclerosis.  02/20/2019-  58 year old female smoker followed for COPD, hypoxic respiratory failure, complicated by Anxiety, DM2, GERD, musculoskeletal pain/pain clinic, Hepatitis C O2  2L sleep and prn/  LIncare, Alprazolam,  Pred taper and Zpak 6/5- NP, Anoro, Albuterol hfa, Neb albuterol,  -----pt reports breathing is about the same, worse at times; needs new inhaler, on O2 2L with activity C/o cramping in chest wall and calf, sometimes with hard coughing. Was wanting an increased script for Flexeril which I declined.  Cough and breathing are stable with no acute event. Says  now smoking 1/2 ppd.   ROS-see HPI   + = positive Constitutional:    weight loss, night sweats, fevers, chills, fatigue, lassitude. HEENT:    headaches, difficulty swallowing, tooth/dental problems, sore throat,       sneezing, itching, ear ache, nasal congestion, post nasal drip, snoring CV:    chest pain, orthopnea, PND, swelling in lower extremities, anasarca,                                               dizziness, palpitations Resp:  + shortness of breath with exertion or at rest.                +productive cough,   + non-productive cough, coughing up of blood.              change in color of mucus.   wheezing.   Skin:    rash or lesions. GI:  No-   heartburn, indigestion, abdominal pain, nausea, vomiting, GU: dysuria, change in color of urine, no urgency or frequency.   flank pain. MS:  + joint pain, stiffness, decreased range of motion, back pain. Neuro-     nothing unusual Psych:  change in mood or affect.  +depression or anxiety.   memory loss.  OBJ- Physical Exam General- Alert, Oriented, Affect-pleasant/  anxious, Distress- none acute,  + slender Skin- rash-none, lesions- none, excoriation- none Lymphadenopathy- none Head- atraumatic            Eyes- Gross vision intact, PERRLA, conjunctivae and secretions clear            Ears- Hearing, canals-normal  Nose- Clear, no-Septal dev, mucus, polyps, erosion, perforation             Throat- Mallampati II , mucosa clear , drainage- none, tonsils- atrophic, + missing teeth ,  Neck- flexible , trachea midline, no stridor , thyroid nl, carotid no bruit Chest - symmetrical excursion , unlabored, no masses           Heart/CV- RRR , no murmur , no gallop  , no rub, nl s1 s2                           - JVD- none , edema- none, stasis changes- none, varices- none           Lung-  Cough + light, wheeze + trace,  dullness-none, rub- none Abd-  Br/ Gen/ Rectal- Not done, not indicated Extrem- cyanosis- none, clubbing, none,  atrophy- none, strength- nl Neuro- grossly intact to observation

## 2019-02-27 DIAGNOSIS — J449 Chronic obstructive pulmonary disease, unspecified: Secondary | ICD-10-CM | POA: Diagnosis not present

## 2019-03-02 DIAGNOSIS — J449 Chronic obstructive pulmonary disease, unspecified: Secondary | ICD-10-CM | POA: Diagnosis not present

## 2019-03-09 DIAGNOSIS — Z1159 Encounter for screening for other viral diseases: Secondary | ICD-10-CM | POA: Diagnosis not present

## 2019-03-09 DIAGNOSIS — Z79899 Other long term (current) drug therapy: Secondary | ICD-10-CM | POA: Diagnosis not present

## 2019-03-10 DIAGNOSIS — J449 Chronic obstructive pulmonary disease, unspecified: Secondary | ICD-10-CM | POA: Diagnosis not present

## 2019-03-22 DIAGNOSIS — J449 Chronic obstructive pulmonary disease, unspecified: Secondary | ICD-10-CM | POA: Diagnosis not present

## 2019-03-29 DIAGNOSIS — J449 Chronic obstructive pulmonary disease, unspecified: Secondary | ICD-10-CM | POA: Diagnosis not present

## 2019-03-30 DIAGNOSIS — J449 Chronic obstructive pulmonary disease, unspecified: Secondary | ICD-10-CM | POA: Diagnosis not present

## 2019-04-02 DIAGNOSIS — J449 Chronic obstructive pulmonary disease, unspecified: Secondary | ICD-10-CM | POA: Diagnosis not present

## 2019-04-06 DIAGNOSIS — Z79899 Other long term (current) drug therapy: Secondary | ICD-10-CM | POA: Diagnosis not present

## 2019-04-09 ENCOUNTER — Other Ambulatory Visit: Payer: Self-pay | Admitting: Family Medicine

## 2019-04-10 DIAGNOSIS — J449 Chronic obstructive pulmonary disease, unspecified: Secondary | ICD-10-CM | POA: Diagnosis not present

## 2019-04-11 ENCOUNTER — Other Ambulatory Visit (HOSPITAL_COMMUNITY): Payer: Self-pay | Admitting: Physician Assistant

## 2019-04-11 ENCOUNTER — Other Ambulatory Visit: Payer: Self-pay | Admitting: Physician Assistant

## 2019-04-11 DIAGNOSIS — M545 Low back pain, unspecified: Secondary | ICD-10-CM

## 2019-04-17 ENCOUNTER — Other Ambulatory Visit: Payer: Self-pay | Admitting: Family Medicine

## 2019-04-18 ENCOUNTER — Ambulatory Visit (HOSPITAL_COMMUNITY): Admission: RE | Admit: 2019-04-18 | Payer: Medicaid Other | Source: Ambulatory Visit

## 2019-04-23 ENCOUNTER — Ambulatory Visit (HOSPITAL_COMMUNITY): Payer: Medicaid Other

## 2019-04-25 ENCOUNTER — Telehealth: Payer: Self-pay | Admitting: Internal Medicine

## 2019-04-25 MED ORDER — PREDNISONE 20 MG PO TABS: 20.0000 mg | ORAL_TABLET | Freq: Every day | ORAL | 0 refills | Status: DC

## 2019-04-25 MED ORDER — AMOXICILLIN 500 MG PO CAPS: 500.0000 mg | ORAL_CAPSULE | Freq: Two times a day (BID) | ORAL | 0 refills | Status: DC

## 2019-04-25 NOTE — Telephone Encounter (Signed)
Offer amoxacillin 500 mg, # 14, 1 twice daily           Prednisone 20 mg, # 7, 1 daily  Stay well-hydrated Ok to use otc cold and flu-symptom remedies like Mucinex or Delsym cough syrup, if needed

## 2019-04-25 NOTE — Telephone Encounter (Signed)
Called and spoke with pt regarding CY's recommendations. Pt agreed to these measures and verified her preferred pharmacy as [facility]. I let her know I have sent these orders in. Pt expressed understanding. Orders have been placed. Nothing further needed at this time.

## 2019-04-25 NOTE — Telephone Encounter (Signed)
Primary Pulmonologist: CY Last office visit and with whom: 02/20/2019 What do we see them for (pulmonary problems): COPD mixed type, Tobacco Abuse, Chronic Respiratory Failure with hypoxia.  Reason for call: Called and spoke w/ pt. Pt states she really needs "amoxicillin and steroid pills" d/t shortness of breath and wheezing. She states she is currently on O2 2L continuously and has had to increase her oxygen every now and then due to not being able to catch her breath. She reports taking her breathing medications as directed and states she already took the last dose of Zpak and prednisone prescribed on 02/20/2019 by CY. I was unable to fully assess pt over the phone d/t background noise (infant crying) and pt expressing distress.   In the last month, have you been in contact with someone who was confirmed or suspected to have Conoravirus / COVID-19?  N/A  Do you have any of the following symptoms developed in the last 30 days? Fever: No Cough: No Shortness of breath: Yes  When did your symptoms start?  04/25/2019  If the patient has a fever, what is the last reading?  (use n/a if patient denies fever)  N/A . IF THE PATIENT STATES THEY DO NOT OWN A THERMOMETER, THEY MUST GO AND PURCHASE ONE When did the fever start?: N/A Have you taken any medication to suppress a fever (ie Ibuprofen, Aleve, Tylenol)?: N/A  CY, please advise with your recommendations for this pt. Thank you.

## 2019-04-30 DIAGNOSIS — J449 Chronic obstructive pulmonary disease, unspecified: Secondary | ICD-10-CM | POA: Diagnosis not present

## 2019-05-01 ENCOUNTER — Ambulatory Visit (HOSPITAL_COMMUNITY): Admission: RE | Admit: 2019-05-01 | Payer: Medicaid Other | Source: Ambulatory Visit

## 2019-05-01 ENCOUNTER — Encounter (HOSPITAL_COMMUNITY): Payer: Self-pay

## 2019-05-03 ENCOUNTER — Other Ambulatory Visit: Payer: Self-pay | Admitting: Internal Medicine

## 2019-05-03 DIAGNOSIS — Z79899 Other long term (current) drug therapy: Secondary | ICD-10-CM | POA: Diagnosis not present

## 2019-05-03 DIAGNOSIS — J449 Chronic obstructive pulmonary disease, unspecified: Secondary | ICD-10-CM | POA: Diagnosis not present

## 2019-05-03 DIAGNOSIS — Z23 Encounter for immunization: Secondary | ICD-10-CM | POA: Diagnosis not present

## 2019-05-10 DIAGNOSIS — J449 Chronic obstructive pulmonary disease, unspecified: Secondary | ICD-10-CM | POA: Diagnosis not present

## 2019-05-14 ENCOUNTER — Other Ambulatory Visit: Payer: Self-pay

## 2019-05-14 MED ORDER — BISOPROLOL-HYDROCHLOROTHIAZIDE 5-6.25 MG PO TABS: 1.0000 | ORAL_TABLET | Freq: Every day | ORAL | 2 refills | Status: DC

## 2019-05-14 MED ORDER — METFORMIN HCL 500 MG PO TABS: 500.0000 mg | ORAL_TABLET | Freq: Two times a day (BID) | ORAL | 10 refills | Status: DC

## 2019-05-16 ENCOUNTER — Telehealth: Payer: Self-pay | Admitting: Internal Medicine

## 2019-05-16 MED ORDER — ANORO ELLIPTA 62.5-25 MCG/INH IN AEPB: 1.0000 | INHALATION_SPRAY | Freq: Every day | RESPIRATORY_TRACT | 5 refills | Status: DC

## 2019-05-16 NOTE — Telephone Encounter (Signed)
Call returned to pharmacy, confirmed DOB and medication. Refill sent. Nothing further needed at this time.

## 2019-05-22 ENCOUNTER — Telehealth: Payer: Self-pay | Admitting: Internal Medicine

## 2019-05-22 MED ORDER — ANORO ELLIPTA 62.5-25 MCG/INH IN AEPB: 1.0000 | INHALATION_SPRAY | Freq: Every day | RESPIRATORY_TRACT | 5 refills | Status: AC

## 2019-05-22 NOTE — Telephone Encounter (Signed)
Call returned to patient, confirmed DOB, medication, and pharmacy. Refill sent. Nothing further needed at this time.  

## 2019-05-30 DIAGNOSIS — J449 Chronic obstructive pulmonary disease, unspecified: Secondary | ICD-10-CM | POA: Diagnosis not present

## 2019-05-31 DIAGNOSIS — Z79899 Other long term (current) drug therapy: Secondary | ICD-10-CM | POA: Diagnosis not present

## 2019-06-02 DIAGNOSIS — J449 Chronic obstructive pulmonary disease, unspecified: Secondary | ICD-10-CM | POA: Diagnosis not present

## 2019-06-02 DIAGNOSIS — N3001 Acute cystitis with hematuria: Secondary | ICD-10-CM | POA: Diagnosis not present

## 2019-06-08 ENCOUNTER — Telehealth: Payer: Self-pay | Admitting: Internal Medicine

## 2019-06-08 MED ORDER — AMOXICILLIN 500 MG PO CAPS: 500.0000 mg | ORAL_CAPSULE | Freq: Two times a day (BID) | ORAL | 0 refills | Status: DC

## 2019-06-08 NOTE — Telephone Encounter (Signed)
Called spoke with patient to discuss prednisone and visit.  Appt scheduled for 11.19.2020 @ 1130.  Sorry Dr Annamaria Boots, patient is now also requesting Rx for amoxicillin.  She stated that she is having prod cough with brown-to-green mucus that has been occurring "for almost this whole month."  Patient stated that it is difficult for her to breathe and she would like the amoxicillin especially for over the weekend in case she really feels that she needs it.  Patient stated she forgot to mention this to [place] when they spoke earlier.

## 2019-06-08 NOTE — Telephone Encounter (Signed)
Offer prednisone 10 mg, # 20. 4 X 2 DAYS, 3 X 2 DAYS, 2 X 2 DAYS, 1 X 2 DAYS  Please offer her my next available ov- held spot is ok.

## 2019-06-08 NOTE — Telephone Encounter (Signed)
Called and spoke w/ pt regarding CY's approval of sending in rx for amoxacillin. Pt verbalized understanding. After confirming her preferred pharmacy, I have sent in an order for Amoxacillin 500 mg, # 14, 1 twice daily. Nothing further needed at this time.

## 2019-06-08 NOTE — Telephone Encounter (Signed)
Call returned to patient, she states the Anoro will need a PA.   Also requesting prednisone. She reports increased SOB and wheezing. She reports she is using 5 vials daily and still is not having much relief. She report she is now using her oxygen all the time where she was not having to do this before.   She is using the combivent and albuterol to maintain her SOB as well as the 5 vials of her albuterol nebulizer medication. Patient is heard audibly wheezing while on phone and very breathless.   Pt has a scheduled appt in January but I made her aware this would most likely have to be moved up due to looking over her chart she is having exacerbations quite frequently.   Will call [place] [facility] to initiate PA for Anoro.   CY please advise. Thanks.

## 2019-06-08 NOTE — Telephone Encounter (Signed)
Call made to [place] [facility] for PA for Anoro, spoke with [place], [place] initiated.    Approved #[id number] for 1 year.   Call made to pharmacy to make aware, Anoro is now available for the patient to pick up. Will update patient once we hear back from CY.

## 2019-06-08 NOTE — Telephone Encounter (Signed)
Amoxacillin 500 mg, # 14, 1 twice daily °

## 2019-06-10 DIAGNOSIS — J449 Chronic obstructive pulmonary disease, unspecified: Secondary | ICD-10-CM | POA: Diagnosis not present

## 2019-06-11 ENCOUNTER — Telehealth: Payer: Self-pay | Admitting: Internal Medicine

## 2019-06-11 MED ORDER — PREDNISONE 10 MG PO TABS: ORAL_TABLET | ORAL | 0 refills | Status: DC

## 2019-06-11 NOTE — Telephone Encounter (Signed)
Call returned to patient, confirmed DOB, she states we was supposed to call her in some prednisone but when she went to the pharmacy they did not have the order. Confirmed with pharmacy. Order sent per CY. Pt aware. Nothing further needed at this time.

## 2019-06-14 ENCOUNTER — Telehealth: Payer: Self-pay | Admitting: Internal Medicine

## 2019-06-14 ENCOUNTER — Other Ambulatory Visit: Payer: Self-pay | Admitting: Internal Medicine

## 2019-06-14 NOTE — Telephone Encounter (Signed)
Received a refill request from patient's pharmacy.   Patient is requesting a refill on her alprazolam 1mg . Last OV was on 02/20/19 with Dr. Annamaria Boots. Next OV is scheduled for 06/28/19 with Dr. Annamaria Boots.   Last RX for alprazolam was on  11/28/18 for 90 tabs with 5 refills.   Pharmacy is [facility].   Dr. Annamaria Boots, please advise if you are ok with this refill. Thanks!

## 2019-06-14 NOTE — Telephone Encounter (Signed)
See refill request from today

## 2019-06-14 NOTE — Telephone Encounter (Signed)
Alprazolam refill e-sent 

## 2019-06-27 ENCOUNTER — Telehealth: Payer: Self-pay | Admitting: *Deleted

## 2019-06-27 NOTE — Telephone Encounter (Signed)
Called patient to do covid screening. Pt wishes to cancel appt. Pt advised that she will need f/u before future refills Xanax. Pt verbalized understanding  Nothing further needed.

## 2019-06-28 ENCOUNTER — Ambulatory Visit: Payer: Medicaid Other | Admitting: Internal Medicine

## 2019-06-29 DIAGNOSIS — Z79899 Other long term (current) drug therapy: Secondary | ICD-10-CM | POA: Diagnosis not present

## 2019-06-29 DIAGNOSIS — G8929 Other chronic pain: Secondary | ICD-10-CM | POA: Diagnosis not present

## 2019-06-29 DIAGNOSIS — Z79891 Long term (current) use of opiate analgesic: Secondary | ICD-10-CM | POA: Diagnosis not present

## 2019-06-29 DIAGNOSIS — M545 Low back pain: Secondary | ICD-10-CM | POA: Diagnosis not present

## 2019-06-30 DIAGNOSIS — J449 Chronic obstructive pulmonary disease, unspecified: Secondary | ICD-10-CM | POA: Diagnosis not present

## 2019-07-10 DIAGNOSIS — J449 Chronic obstructive pulmonary disease, unspecified: Secondary | ICD-10-CM | POA: Diagnosis not present

## 2019-07-27 DIAGNOSIS — Z79899 Other long term (current) drug therapy: Secondary | ICD-10-CM | POA: Diagnosis not present

## 2019-07-27 DIAGNOSIS — Z1159 Encounter for screening for other viral diseases: Secondary | ICD-10-CM | POA: Diagnosis not present

## 2019-07-30 DIAGNOSIS — J449 Chronic obstructive pulmonary disease, unspecified: Secondary | ICD-10-CM | POA: Diagnosis not present

## 2019-08-09 ENCOUNTER — Other Ambulatory Visit: Payer: Self-pay | Admitting: Family Medicine

## 2019-08-10 DIAGNOSIS — J449 Chronic obstructive pulmonary disease, unspecified: Secondary | ICD-10-CM | POA: Diagnosis not present

## 2019-08-14 ENCOUNTER — Other Ambulatory Visit: Payer: Self-pay | Admitting: Family Medicine

## 2019-08-15 ENCOUNTER — Other Ambulatory Visit: Payer: Self-pay | Admitting: Internal Medicine

## 2019-08-15 NOTE — Telephone Encounter (Signed)
Pt called requesting to have a refill of both her amoxicillin and prednisone. Pt said the cold weather has caused her to have chest congestion and is coughing up green to yellow phlegm. Pt also said that her breathing has been affected. Pt denies any complaints of fever.  Dr. Maple Hudson, please advise if you are okay with this being done. Thanks!  Allergies  Allergen Reactions  . Aspirin Nausea And Vomiting     Current Outpatient Medications:  .  bisoprolol-hydrochlorothiazide (ZIAC) 5-6.25 MG tablet, TAKE 1 TABLET BY MOUTH DAILY., Disp: 30 tablet, Rfl: 2 .  cyclobenzaprine (FLEXERIL) 10 MG tablet, TAKE 1 TABLET BY MOUTH 3 TIMES DAILY AS NEEDED FOR MUSCLE SPASMS, Disp: 90 tablet, Rfl: 3 .  lisinopril (ZESTRIL) 20 MG tablet, TAKE 1 TABLET BY MOUTH DAILY, Disp: 90 tablet, Rfl: 1 .  omeprazole (PRILOSEC) 40 MG capsule, TAKE 1 CAPSULE BY MOUTH DAILY., Disp: 30 capsule, Rfl: 3 .  albuterol (PROVENTIL) (2.5 MG/3ML) 0.083% nebulizer solution, INHALE THE CONTENTS OF 1 VIAL VIA NEBULIZER EVERY 6 HOURS AS NEEDED FOR WHEEZING OR SHORTNESS OF BREATH., Disp: 75 mL, Rfl: 12 .  albuterol (VENTOLIN HFA) 108 (90 Base) MCG/ACT inhaler, INHALE 2 PUFFS INTO THE LUNGS EVERY 6 HOURS AS NEEDED FOR WHEEZING OR SHORTNESS OF BREATH., Disp: 17 g, Rfl: 12 .  ALPRAZolam (XANAX) 1 MG tablet, TAKE 1 TABLET BY MOUTH 3 TIMES A DAY AS NEEDED FOR ANXIETY, Disp: 90 tablet, Rfl: 5 .  amLODipine (NORVASC) 5 MG tablet, TAKE 1 TABLET (5 MG TOTAL) BY MOUTH DAILY., Disp: 30 tablet, Rfl: 5 .  amoxicillin (AMOXIL) 500 MG capsule, Take 1 capsule (500 mg total) by mouth 2 (two) times daily., Disp: 14 capsule, Rfl: 0 .  azithromycin (ZITHROMAX) 250 MG tablet, Take two tablets now, then take 1 tablet daily for 4 days. Dx:J44.9, Disp: 6 tablet, Rfl: 0 .  azithromycin (ZITHROMAX) 250 MG tablet, 2 today then one daily, Disp: 6 tablet, Rfl: 0 .  cetirizine (ZYRTEC) 10 MG tablet, Take 1 tablet (10 mg total) by mouth daily., Disp: 10 tablet, Rfl: 12 .   COMBIVENT RESPIMAT 20-100 MCG/ACT AERS respimat, INHALE 1 PUFF EVERY 6 HOURS AS NEEDED, Disp: 4 g, Rfl: 4 .  lisinopril (PRINIVIL,ZESTRIL) 40 MG tablet, Take 40 mg by mouth daily., Disp: , Rfl:  .  metFORMIN (GLUCOPHAGE) 500 MG tablet, Take 1 tablet (500 mg total) by mouth 2 (two) times daily with a meal., Disp: 90 tablet, Rfl: 10 .  Multiple Vitamin (MULTIVITAMIN WITH MINERALS) TABS, Take 1 tablet by mouth daily., Disp: , Rfl:  .  Oxycodone HCl 10 MG TABS, Take 1 tablet by mouth 4 (four) times daily., Disp: , Rfl: 0 .  OXYGEN, Inhale into the lungs daily., Disp: , Rfl:  .  predniSONE (DELTASONE) 10 MG tablet, 4 X 2 DAYS, 3 X 2 DAYS, 2 X 2 DAYS, 1 X 2 DAYS, Disp: 20 tablet, Rfl: 0 .  predniSONE (DELTASONE) 20 MG tablet, Take 1 tablet (20 mg total) by mouth daily with breakfast., Disp: 7 tablet, Rfl: 0

## 2019-08-16 MED ORDER — PREDNISONE 10 MG PO TABS: ORAL_TABLET | ORAL | 0 refills | Status: DC

## 2019-08-16 MED ORDER — AZITHROMYCIN 250 MG PO TABS: ORAL_TABLET | ORAL | 0 refills | Status: DC

## 2019-08-16 NOTE — Telephone Encounter (Signed)
Dr. Maple Hudson please advise on the prednisone. Two recent pred scripts have been sent in, one being a taper and the other being a steady 20mg  for 7 days. Please advise. Thanks.

## 2019-08-16 NOTE — Telephone Encounter (Signed)
I refilled pred taper- done

## 2019-08-16 NOTE — Telephone Encounter (Signed)
Yes- ok to refill both as before

## 2019-08-23 ENCOUNTER — Ambulatory Visit: Payer: Medicaid Other | Admitting: Internal Medicine

## 2019-08-23 DIAGNOSIS — Z79899 Other long term (current) drug therapy: Secondary | ICD-10-CM | POA: Diagnosis not present

## 2019-08-30 DIAGNOSIS — J449 Chronic obstructive pulmonary disease, unspecified: Secondary | ICD-10-CM | POA: Diagnosis not present

## 2019-09-10 DIAGNOSIS — J449 Chronic obstructive pulmonary disease, unspecified: Secondary | ICD-10-CM | POA: Diagnosis not present

## 2019-09-12 ENCOUNTER — Telehealth: Payer: Self-pay | Admitting: Internal Medicine

## 2019-09-12 NOTE — Telephone Encounter (Signed)
Patient was issued albuterol/ventolin [facility] 02/19/19 with 12 refills. [facility] and confirmed they did receive the prescription issued July 2020. The last time the patient picked up the prescription was in September 2020 and it has ten refills with 16 days left on it.   I also confirmed the patient did pick up her nebulizer solution on 09/03/19. I told the pharmacist the patient will need her rescue inhaler and to please fill the prescription that was issued July 2020. I told them I would call the patient to make her aware.  Called the patient and advised. She voiced understanding and confirmed she has been using the nebulizer solution. Nothing further needed at this time.

## 2019-09-21 ENCOUNTER — Other Ambulatory Visit: Payer: Self-pay | Admitting: Internal Medicine

## 2019-09-24 DIAGNOSIS — Z79899 Other long term (current) drug therapy: Secondary | ICD-10-CM | POA: Diagnosis not present

## 2019-09-26 ENCOUNTER — Ambulatory Visit (INDEPENDENT_AMBULATORY_CARE_PROVIDER_SITE_OTHER): Payer: Medicaid Other | Admitting: Internal Medicine

## 2019-09-26 ENCOUNTER — Ambulatory Visit (INDEPENDENT_AMBULATORY_CARE_PROVIDER_SITE_OTHER): Payer: Medicaid Other

## 2019-09-26 ENCOUNTER — Encounter: Payer: Self-pay | Admitting: Internal Medicine

## 2019-09-26 ENCOUNTER — Other Ambulatory Visit: Payer: Self-pay

## 2019-09-26 VITALS — BP 116/72 | HR 89 | Ht 61.0 in | Wt 89.6 lb

## 2019-09-26 DIAGNOSIS — J449 Chronic obstructive pulmonary disease, unspecified: Secondary | ICD-10-CM | POA: Diagnosis not present

## 2019-09-26 DIAGNOSIS — Z800 Family history of malignant neoplasm of digestive organs: Secondary | ICD-10-CM | POA: Diagnosis not present

## 2019-09-26 DIAGNOSIS — J9611 Chronic respiratory failure with hypoxia: Secondary | ICD-10-CM | POA: Diagnosis not present

## 2019-09-26 DIAGNOSIS — J439 Emphysema, unspecified: Secondary | ICD-10-CM | POA: Diagnosis not present

## 2019-09-26 DIAGNOSIS — Z720 Tobacco use: Secondary | ICD-10-CM | POA: Diagnosis not present

## 2019-09-26 MED ORDER — AMOXICILLIN 500 MG PO TABS: ORAL_TABLET | ORAL | 3 refills | Status: DC

## 2019-09-26 MED ORDER — METHYLPREDNISOLONE ACETATE 80 MG/ML IJ SUSP
80.0000 mg | Freq: Once | INTRAMUSCULAR | Status: AC
  Administered 2019-09-26: 80 mg via INTRA_ARTICULAR

## 2019-09-26 NOTE — Progress Notes (Signed)
Patient ID: Megan Soto, female    DOB: 10-10-1960, 59 y.o.   MRN: [id number]  HPI  female smoker followed for COPD, complicated by anxiety, DM, GERD, musculoskeletal pain/pain clinic, hepatitis C Walk test on room air 10/11/16 -Room air resting saturation 88%, desaturated to 86% walking on room air, recovered on 3 L to 97% Office Spirometry 10/11/2016-severe obstructive airways disease-FVC 2.30/73%, FEV1 1.21/49%, ratio 0.52 -------------------------------------------------------------------------------------- 02/20/2019-  59 year old female smoker followed for COPD, hypoxic respiratory failure, complicated by Anxiety, DM2, GERD, musculoskeletal pain/pain clinic, Hepatitis C O2  2L sleep and prn/  LIncare, Alprazolam,  Pred taper and Zpak 6/5- NP, Anoro, Albuterol hfa, Neb albuterol,  -----pt reports breathing is about the same, worse at times; needs new inhaler, on O2 2L with activity C/o cramping in chest wall and calf, sometimes with hard coughing. Was wanting an increased script for Flexeril which I declined.  Cough and breathing are stable with no acute event. Says now smoking 1/2 ppd.   09/26/19- 59 year old female smoker followed for COPD, hypoxic respiratory failure, complicated by Anxiety, DM2, GERD, musculoskeletal pain/pain clinic, Hepatitis C O2  2L sleep and prn/  LIncare, Alprazolam,   Anoro, Albuterol hfa, Neb albuterol, Combivent,  -----f/u COPD mixed type Smoking 1 ppd against advice. Chantix didn't help. Not trying to quit.  Daily productive cough white/ green, no blood or fever. Discussed use of Anoro, rescue albuterol and Combivent, noting overlap. Occ uses neb. Hep C is stable. Anxiety controlled- worse while cold weather keeps her indoors. Teeth in bad repair. Trying to get a local dentist who takes Medicaid. Had flu vax, pending Covid vax. She asks depomedrol today and amoxacillin to hold- discussed.   ROS-see HPI   + = positive Constitutional:    weight loss, night  sweats, fevers, chills, fatigue, lassitude. HEENT:    headaches, difficulty swallowing, tooth/dental problems, sore throat,       sneezing, itching, ear ache, nasal congestion, post nasal drip, snoring CV:    chest pain, orthopnea, PND, swelling in lower extremities, anasarca,                                               dizziness, palpitations Resp:  + shortness of breath with exertion or at rest.                +productive cough,   + non-productive cough, coughing up of blood.              change in color of mucus.   wheezing.   Skin:    rash or lesions. GI:  No-   heartburn, indigestion, abdominal pain, nausea, vomiting, GU: dysuria, change in color of urine, no urgency or frequency.   flank pain. MS:  + joint pain, stiffness, decreased range of motion, back pain. Neuro-     nothing unusual Psych:  change in mood or affect.  +depression or anxiety.   memory loss.  OBJ- Physical Exam General- Alert, Oriented, Affect-pleasant/  anxious, Distress- none acute,  + petite Skin- rash-none, lesions- none, excoriation- none Lymphadenopathy- none Head- atraumatic            Eyes- Gross vision intact, PERRLA, conjunctivae and secretions clear            Ears- Hearing, canals-normal            Nose- Clear,  no-Septal dev, mucus, polyps, erosion, perforation             Throat- Mallampati II , mucosa clear , drainage- none, tonsils- atrophic, + missing teeth ,  Neck- flexible , trachea midline, no stridor , thyroid nl, carotid no bruit Chest - symmetrical excursion , unlabored, no masses           Heart/CV- RRR , no murmur , no gallop  , no rub, nl s1 s2                           - JVD- none , edema- none, stasis changes- none, varices- none           Lung-  Cough + light, rhonchi+scattered, wheeze + mild  dullness-none, rub- none Abd-  Br/ Gen/ Rectal- Not done, not indicated Extrem- cyanosis- none, clubbing, none, atrophy- none, strength- nl Neuro- grossly intact to observation

## 2019-09-26 NOTE — Assessment & Plan Note (Signed)
Had colonoscopy in past year.

## 2019-09-26 NOTE — Assessment & Plan Note (Signed)
Counseled again. Options for cessation support.

## 2019-09-26 NOTE — Patient Instructions (Signed)
Order- CXR- dx  COPD mixed type  Order- depo 80   Script printed amoxacillin antibiotic to hold  Please cal if we can help

## 2019-09-26 NOTE — Assessment & Plan Note (Signed)
Mildly worse bronchitis than baseline. Discussed meds, smoking. Plan- CXR, amoxacillin to hold, depomedrol

## 2019-09-26 NOTE — Assessment & Plan Note (Signed)
Remains dependent on O2. 2L is sufficient.

## 2019-09-30 DIAGNOSIS — J449 Chronic obstructive pulmonary disease, unspecified: Secondary | ICD-10-CM | POA: Diagnosis not present

## 2019-10-08 ENCOUNTER — Other Ambulatory Visit: Payer: Self-pay | Admitting: Internal Medicine

## 2019-10-08 ENCOUNTER — Telehealth: Payer: Self-pay | Admitting: Internal Medicine

## 2019-10-08 ENCOUNTER — Other Ambulatory Visit: Payer: Self-pay | Admitting: Family Medicine

## 2019-10-08 DIAGNOSIS — J449 Chronic obstructive pulmonary disease, unspecified: Secondary | ICD-10-CM | POA: Diagnosis not present

## 2019-10-08 MED ORDER — ALBUTEROL SULFATE HFA 108 (90 BASE) MCG/ACT IN AERS: INHALATION_SPRAY | RESPIRATORY_TRACT | 12 refills | Status: DC

## 2019-10-08 NOTE — Telephone Encounter (Signed)
Called and spoke with pt letting her know that her albuterol and combivent inhalers were refilled for her and she verbalized understanding.nothing further needed.

## 2019-10-09 ENCOUNTER — Telehealth: Payer: Self-pay | Admitting: Internal Medicine

## 2019-10-09 NOTE — Telephone Encounter (Signed)
[name] requested last OV notes from 09/26/19, be faxed to [phone]. Requested OV notes faxed. Confirmation received.  Nothing further at this time.

## 2019-10-22 DIAGNOSIS — Z79899 Other long term (current) drug therapy: Secondary | ICD-10-CM | POA: Diagnosis not present

## 2019-10-24 DIAGNOSIS — J449 Chronic obstructive pulmonary disease, unspecified: Secondary | ICD-10-CM | POA: Diagnosis not present

## 2019-10-28 DIAGNOSIS — J449 Chronic obstructive pulmonary disease, unspecified: Secondary | ICD-10-CM | POA: Diagnosis not present

## 2019-11-08 DIAGNOSIS — J449 Chronic obstructive pulmonary disease, unspecified: Secondary | ICD-10-CM | POA: Diagnosis not present

## 2019-11-12 ENCOUNTER — Other Ambulatory Visit: Payer: Self-pay | Admitting: Family Medicine

## 2019-11-20 DIAGNOSIS — Z79899 Other long term (current) drug therapy: Secondary | ICD-10-CM | POA: Diagnosis not present

## 2019-11-24 DIAGNOSIS — J449 Chronic obstructive pulmonary disease, unspecified: Secondary | ICD-10-CM | POA: Diagnosis not present

## 2019-11-26 ENCOUNTER — Other Ambulatory Visit: Payer: Self-pay | Admitting: Internal Medicine

## 2019-11-26 ENCOUNTER — Other Ambulatory Visit: Payer: Self-pay | Admitting: Family Medicine

## 2019-11-26 NOTE — Telephone Encounter (Signed)
Dr. Maple Hudson, please advise if you are okay refilling med.  Allergies  Allergen Reactions  . Aspirin Nausea And Vomiting     Current Outpatient Medications:  .  albuterol (PROVENTIL) (2.5 MG/3ML) 0.083% nebulizer solution, INHALE THE CONTENTS OF 1 VIAL VIA NEBULIZER EVERY 6 HOURS AS NEEDED FOR WHEEZING OR SHORTNESS OF BREATH., Disp: 300 mL, Rfl: 0 .  albuterol (VENTOLIN HFA) 108 (90 Base) MCG/ACT inhaler, INHALE 2 PUFFS INTO THE LUNGS EVERY 6 HOURS AS NEEDED FOR WHEEZING OR SHORTNESS OF BREATH., Disp: 17 g, Rfl: 12 .  ALPRAZolam (XANAX) 1 MG tablet, TAKE 1 TABLET BY MOUTH 3 TIMES A DAY AS NEEDED FOR ANXIETY, Disp: 90 tablet, Rfl: 5 .  amLODipine (NORVASC) 5 MG tablet, TAKE 1 TABLET (5 MG TOTAL) BY MOUTH DAILY., Disp: 30 tablet, Rfl: 5 .  amoxicillin (AMOXIL) 500 MG tablet, 1 twice daily- antibiotic, Disp: 14 tablet, Rfl: 3 .  bisoprolol-hydrochlorothiazide (ZIAC) 5-6.25 MG tablet, TAKE 1 TABLET BY MOUTH DAILY., Disp: 30 tablet, Rfl: 2 .  cetirizine (ZYRTEC) 10 MG tablet, Take 1 tablet (10 mg total) by mouth daily., Disp: 10 tablet, Rfl: 12 .  COMBIVENT RESPIMAT 20-100 MCG/ACT AERS respimat, INHALE 1 PUFF EVERY 6 HOURS AS NEEDED, Disp: 4 g, Rfl: 4 .  cyclobenzaprine (FLEXERIL) 10 MG tablet, TAKE 1 TABLET BY MOUTH 3 TIMES DAILY AS NEEDED FOR MUSCLE SPASMS, Disp: 90 tablet, Rfl: 3 .  lisinopril (ZESTRIL) 20 MG tablet, TAKE 1 TABLET BY MOUTH DAILY, Disp: 90 tablet, Rfl: 1 .  metFORMIN (GLUCOPHAGE) 500 MG tablet, Take 1 tablet (500 mg total) by mouth 2 (two) times daily with a meal., Disp: 90 tablet, Rfl: 10 .  Multiple Vitamin (MULTIVITAMIN WITH MINERALS) TABS, Take 1 tablet by mouth daily., Disp: , Rfl:  .  omeprazole (PRILOSEC) 40 MG capsule, TAKE 1 CAPSULE BY MOUTH DAILY., Disp: 30 capsule, Rfl: 3 .  Oxycodone HCl 10 MG TABS, Take 1 tablet by mouth 4 (four) times daily., Disp: , Rfl: 0 .  OXYGEN, Inhale into the lungs daily., Disp: , Rfl:

## 2019-11-27 ENCOUNTER — Telehealth: Payer: Self-pay | Admitting: Internal Medicine

## 2019-11-27 MED ORDER — PREDNISONE 10 MG PO TABS: ORAL_TABLET | ORAL | 0 refills | Status: DC

## 2019-11-27 NOTE — Telephone Encounter (Signed)
Xanax has already been refilled. Offer prednisone 10 mg, # 20, 4 X 2 DAYS, 3 X 2 DAYS, 2 X 2 DAYS, 1 X 2 DAYS

## 2019-11-27 NOTE — Telephone Encounter (Signed)
Rx for pred was sent  LMTCB x 1

## 2019-11-27 NOTE — Telephone Encounter (Signed)
Alprazolam refill e-sent 

## 2019-11-27 NOTE — Telephone Encounter (Signed)
Called and spoke with Patient. Patient stated she has been having increased SHOB, and wheezing, since weather has warmed up.  Patient stated she is trying to stay inside, because she feels this is allergy, pollen related. Patient stated she is having productive cough, with brownish, sometimes green phlegm. Patient denies fever or chills. Patient requested Prednisone and a refill on Xanax to be sent to [facility].  Message routed to Dr. Maple Hudson  Allergies  Allergen Reactions  . Aspirin Nausea And Vomiting   Current Outpatient Medications on File Prior to Visit  Medication Sig Dispense Refill  . albuterol (PROVENTIL) (2.5 MG/3ML) 0.083% nebulizer solution INHALE THE CONTENTS OF 1 VIAL VIA NEBULIZER EVERY 6 HOURS AS NEEDED FOR WHEEZING OR SHORTNESS OF BREATH. 300 mL 0  . albuterol (VENTOLIN HFA) 108 (90 Base) MCG/ACT inhaler INHALE 2 PUFFS INTO THE LUNGS EVERY 6 HOURS AS NEEDED FOR WHEEZING OR SHORTNESS OF BREATH. 17 g 12  . ALPRAZolam (XANAX) 1 MG tablet TAKE 1 TABLET BY MOUTH 3 TIMES A DAY AS NEEDED FOR ANXIETY 90 tablet 5  . amLODipine (NORVASC) 5 MG tablet TAKE 1 TABLET (5 MG TOTAL) BY MOUTH DAILY. 30 tablet 5  . amoxicillin (AMOXIL) 500 MG tablet 1 twice daily- antibiotic 14 tablet 3  . bisoprolol-hydrochlorothiazide (ZIAC) 5-6.25 MG tablet TAKE 1 TABLET BY MOUTH DAILY. 30 tablet 2  . cetirizine (ZYRTEC) 10 MG tablet Take 1 tablet (10 mg total) by mouth daily. 10 tablet 12  . COMBIVENT RESPIMAT 20-100 MCG/ACT AERS respimat INHALE 1 PUFF EVERY 6 HOURS AS NEEDED 4 g 4  . cyclobenzaprine (FLEXERIL) 10 MG tablet TAKE 1 TABLET BY MOUTH 3 TIMES DAILY AS NEEDED FOR MUSCLE SPASMS 90 tablet 3  . lisinopril (ZESTRIL) 20 MG tablet TAKE 1 TABLET BY MOUTH DAILY 90 tablet 1  . metFORMIN (GLUCOPHAGE) 500 MG tablet Take 1 tablet (500 mg total) by mouth 2 (two) times daily with a meal. 90 tablet 10  . Multiple Vitamin (MULTIVITAMIN WITH MINERALS) TABS Take 1 tablet by mouth daily.    Marland Kitchen omeprazole  (PRILOSEC) 40 MG capsule TAKE 1 CAPSULE BY MOUTH DAILY. 30 capsule 3  . Oxycodone HCl 10 MG TABS Take 1 tablet by mouth 4 (four) times daily.  0  . OXYGEN Inhale into the lungs daily.     No current facility-administered medications on file prior to visit.

## 2019-11-28 DIAGNOSIS — J449 Chronic obstructive pulmonary disease, unspecified: Secondary | ICD-10-CM | POA: Diagnosis not present

## 2019-11-28 NOTE — Telephone Encounter (Signed)
Spoke with pt. She is aware that prednisone has been sent in for her. Nothing further was needed.

## 2019-12-08 DIAGNOSIS — J449 Chronic obstructive pulmonary disease, unspecified: Secondary | ICD-10-CM | POA: Diagnosis not present

## 2019-12-11 ENCOUNTER — Other Ambulatory Visit: Payer: Self-pay | Admitting: Family Medicine

## 2019-12-21 DIAGNOSIS — Z79899 Other long term (current) drug therapy: Secondary | ICD-10-CM | POA: Diagnosis not present

## 2019-12-21 DIAGNOSIS — G8929 Other chronic pain: Secondary | ICD-10-CM | POA: Diagnosis not present

## 2019-12-21 DIAGNOSIS — Z79891 Long term (current) use of opiate analgesic: Secondary | ICD-10-CM | POA: Diagnosis not present

## 2019-12-21 DIAGNOSIS — E109 Type 1 diabetes mellitus without complications: Secondary | ICD-10-CM | POA: Diagnosis not present

## 2019-12-21 DIAGNOSIS — M545 Low back pain: Secondary | ICD-10-CM | POA: Diagnosis not present

## 2019-12-24 DIAGNOSIS — J449 Chronic obstructive pulmonary disease, unspecified: Secondary | ICD-10-CM | POA: Diagnosis not present

## 2019-12-28 DIAGNOSIS — J449 Chronic obstructive pulmonary disease, unspecified: Secondary | ICD-10-CM | POA: Diagnosis not present

## 2020-01-08 DIAGNOSIS — J449 Chronic obstructive pulmonary disease, unspecified: Secondary | ICD-10-CM | POA: Diagnosis not present

## 2020-01-14 ENCOUNTER — Other Ambulatory Visit: Payer: Self-pay | Admitting: Internal Medicine

## 2020-01-18 DIAGNOSIS — G8929 Other chronic pain: Secondary | ICD-10-CM | POA: Diagnosis not present

## 2020-01-18 DIAGNOSIS — Z79891 Long term (current) use of opiate analgesic: Secondary | ICD-10-CM | POA: Diagnosis not present

## 2020-01-18 DIAGNOSIS — Z79899 Other long term (current) drug therapy: Secondary | ICD-10-CM | POA: Diagnosis not present

## 2020-01-18 DIAGNOSIS — M545 Low back pain: Secondary | ICD-10-CM | POA: Diagnosis not present

## 2020-01-18 DIAGNOSIS — E109 Type 1 diabetes mellitus without complications: Secondary | ICD-10-CM | POA: Diagnosis not present

## 2020-01-24 DIAGNOSIS — J449 Chronic obstructive pulmonary disease, unspecified: Secondary | ICD-10-CM | POA: Diagnosis not present

## 2020-01-28 DIAGNOSIS — J449 Chronic obstructive pulmonary disease, unspecified: Secondary | ICD-10-CM | POA: Diagnosis not present

## 2020-02-06 ENCOUNTER — Telehealth: Payer: Self-pay | Admitting: Internal Medicine

## 2020-02-06 MED ORDER — PREDNISONE 10 MG PO TABS: ORAL_TABLET | ORAL | 0 refills | Status: AC

## 2020-02-06 MED ORDER — DOXYCYCLINE HYCLATE 100 MG PO TABS
100.0000 mg | ORAL_TABLET | Freq: Two times a day (BID) | ORAL | 0 refills | Status: DC
Start: 2020-02-06 — End: 2020-02-20

## 2020-02-06 NOTE — Telephone Encounter (Signed)
Dr. Maple Hudson patient with COPD. Called with congestion, wheezing, cough, and mucus that is brown and green. She is using her Albuterol nebulizer and it is helping some. Her SOB is unchanged. She is requesting prednisone and antibiotics. Symptoms have been ongoing for three days. Please advise.

## 2020-02-06 NOTE — Telephone Encounter (Signed)
Does anyone have any openings for a televisit tomorrow?

## 2020-02-06 NOTE — Telephone Encounter (Signed)
Nothing available tomorrow, but there is some availability on Friday.  Do you want [place] to schedule something for Friday?  Please advise.  Thank you.

## 2020-02-06 NOTE — Addendum Note (Signed)
Addended by: Delrae Rend on: 02/06/2020 05:45 PM   Modules accepted: Orders

## 2020-02-06 NOTE — Telephone Encounter (Signed)
I am sorry, I meant Tuesday.  I will call the patient and schedule the follow up.  Thank you. Spoke with patient, provided instructions per Maralyn Sago and prescriptions sent to her pharmacy.  Appointment scheduled for 02/20/20.  Nothing further needed.

## 2020-02-06 NOTE — Telephone Encounter (Signed)
I am the only provider here Friday, and I am supposed to have a limited schedule. There are already 13 people on my " limited schedule".  Please Send in Doxycycline 100 mg BID x 7 days>> Dispense 14 tablets Prednisone taper; 10 mg tablets: 4 tabs x 2 days, 3 tabs x 2 days, 2 tabs x 2 days 1 tab x 2 days then stop. Dispense 20 tablets Will need follow up in 2 weeks Tell them to call is they get worse not better Seek emergency care for shortness of breath that does not resolve quickly. Thanks so much

## 2020-02-07 NOTE — Telephone Encounter (Signed)
Thanks so much.  Heather

## 2020-02-07 NOTE — Telephone Encounter (Signed)
Berniece Salines are very welcome.

## 2020-02-08 ENCOUNTER — Other Ambulatory Visit: Payer: Self-pay | Admitting: Family Medicine

## 2020-02-12 ENCOUNTER — Other Ambulatory Visit: Payer: Self-pay | Admitting: Internal Medicine

## 2020-02-12 NOTE — Telephone Encounter (Signed)
Called patient discuss her refill request for lisinopril after noting documentation in her chart that she has been a difficulty intubation in the past and that switching to an ARB may be more appropriate to avoid the risk of angioedema or other difficulties should she ever need intubation in the future.  Patient states that she understands this but she has been on lisinopril for a long period of time and would like to have it continued.  Discussed with patient that I would love to meet her in person as I am now her primary care physician and patient states that she would call the clinic to schedule an appointment.  Refill request for lisinopril approved.

## 2020-02-15 DIAGNOSIS — E109 Type 1 diabetes mellitus without complications: Secondary | ICD-10-CM | POA: Diagnosis not present

## 2020-02-15 DIAGNOSIS — Z79899 Other long term (current) drug therapy: Secondary | ICD-10-CM | POA: Diagnosis not present

## 2020-02-15 DIAGNOSIS — G8929 Other chronic pain: Secondary | ICD-10-CM | POA: Diagnosis not present

## 2020-02-15 DIAGNOSIS — M545 Low back pain: Secondary | ICD-10-CM | POA: Diagnosis not present

## 2020-02-18 ENCOUNTER — Telehealth: Payer: Self-pay | Admitting: Internal Medicine

## 2020-02-18 MED ORDER — ALBUTEROL SULFATE HFA 108 (90 BASE) MCG/ACT IN AERS: INHALATION_SPRAY | RESPIRATORY_TRACT | 12 refills | Status: AC

## 2020-02-18 NOTE — Telephone Encounter (Signed)
Called and spoke with Patient.  Patient stated she is using nebs, combivent, and albuterol.  Patient stated she is using Albuterol inhaler.  Patient stated she is using more then 1 month Albuterol inhaler, in 1 months time. Patient stated she needs a prescription stating she needs Albuterol emergency inhaler, give 2 inhalers per month.   Patient stated [facility] refuses to give Patient another Albuterol inhaler until next month.  Patient stated she is completley out.  Allergies  Allergen Reactions  . Aspirin Nausea And Vomiting   Current Outpatient Medications on File Prior to Visit  Medication Sig Dispense Refill  . albuterol (PROVENTIL) (2.5 MG/3ML) 0.083% nebulizer solution Take 3 mLs (2.5 mg total) by nebulization every 6 (six) hours as needed for wheezing or shortness of breath. 300 mL 5  . albuterol (VENTOLIN HFA) 108 (90 Base) MCG/ACT inhaler INHALE 2 PUFFS INTO THE LUNGS EVERY 6 HOURS AS NEEDED FOR WHEEZING OR SHORTNESS OF BREATH. 17 g 12  . ALPRAZolam (XANAX) 1 MG tablet TAKE 1 TABLET BY MOUTH 3 TIMES A DAY AS NEEDED FOR ANXIETY 90 tablet 5  . amLODipine (NORVASC) 5 MG tablet TAKE 1 TABLET (5 MG TOTAL) BY MOUTH DAILY. 30 tablet 5  . amoxicillin (AMOXIL) 500 MG tablet 1 twice daily- antibiotic 14 tablet 3  . bisoprolol-hydrochlorothiazide (ZIAC) 5-6.25 MG tablet TAKE 1 TABLET BY MOUTH DAILY. 30 tablet 2  . cetirizine (ZYRTEC) 10 MG tablet Take 1 tablet (10 mg total) by mouth daily. 10 tablet 12  . COMBIVENT RESPIMAT 20-100 MCG/ACT AERS respimat INHALE 1 PUFF EVERY 6 HOURS AS NEEDED 4 g 4  . cyclobenzaprine (FLEXERIL) 10 MG tablet TAKE 1 TABLET BY MOUTH 3 TIMES DAILY AS NEEDED FOR MUSCLE SPASMS 90 tablet 3  . doxycycline (VIBRA-TABS) 100 MG tablet Take 1 tablet (100 mg total) by mouth 2 (two) times daily. 14 tablet 0  . lisinopril (ZESTRIL) 20 MG tablet TAKE 1 TABLET BY MOUTH DAILY 30 tablet 1  . metFORMIN (GLUCOPHAGE) 500 MG tablet Take 1 tablet (500 mg total) by mouth 2 (two) times  daily with a meal. 90 tablet 10  . Multiple Vitamin (MULTIVITAMIN WITH MINERALS) TABS Take 1 tablet by mouth daily.    Marland Kitchen omeprazole (PRILOSEC) 40 MG capsule TAKE 1 CAPSULE BY MOUTH DAILY. 30 capsule 3  . Oxycodone HCl 10 MG TABS Take 1 tablet by mouth 4 (four) times daily.  0  . OXYGEN Inhale into the lungs daily.    . predniSONE (DELTASONE) 10 MG tablet 4 x 2 days, 3 x 2 days, 2 x 2 days, 1 x 2 days, 20 tablet 0   No current facility-administered medications on file prior to visit.   Message routed to Dr Maple Hudson to advise

## 2020-02-18 NOTE — Telephone Encounter (Signed)
Albuterol rescue inhaler reordered to allow 2 inhalers per month

## 2020-02-18 NOTE — Telephone Encounter (Signed)
Patient aware Albuterol inhaler prescription  sent to pharmacy, requesting 2 inhalers per month, by Dr Maple Hudson.  Nothing further at this time.

## 2020-02-20 ENCOUNTER — Other Ambulatory Visit: Payer: Self-pay

## 2020-02-20 ENCOUNTER — Encounter: Payer: Self-pay | Admitting: Primary Care

## 2020-02-20 ENCOUNTER — Ambulatory Visit (INDEPENDENT_AMBULATORY_CARE_PROVIDER_SITE_OTHER): Payer: Medicaid Other | Admitting: Primary Care

## 2020-02-20 DIAGNOSIS — J449 Chronic obstructive pulmonary disease, unspecified: Secondary | ICD-10-CM

## 2020-02-20 MED ORDER — TRELEGY ELLIPTA 100-62.5-25 MCG/INH IN AEPB
1.0000 | INHALATION_SPRAY | Freq: Every day | RESPIRATORY_TRACT | 6 refills | Status: AC
Start: 2020-02-20 — End: ?

## 2020-02-20 NOTE — Progress Notes (Signed)
Virtual Visit via Telephone Note  I connected with Megan Soto on 02/20/20 at  3:00 PM EDT by telephone and verified that I am speaking with the correct person using two identifiers.  Location: Patient: Home Provider: Office   I discussed the limitations, risks, security and privacy concerns of performing an evaluation and management service by telephone and the availability of in person appointments. I also discussed with the patient that there may be a patient responsible charge related to this service. The patient expressed understanding and agreed to proceed.   History of Present Illness: 59 year old female, current smoker.  Past medical history significant for COPD mixed type, chronic respiratory failure with hypoxia, anxiety.  Patient of Dr. Maple Hudson, last seen in office 09/26/2019. She is maintained on Combivent Respimat 1 puff every 6 hours as needed.  Appears she is been on Anoro in the past.  02/20/2020 Patient contacted today for virtual telephone visit to follow-up COPD exacerbation.  Patient called our office on June 30 with reports of increased chest congestion, cough and wheezing.  She was sent in a prescription for doxycycline and prednisone taper.   She is feeling better. Completed doxycycline and prednisone course. Reports less congestion, she is getting brown mucus. She is still smoking 1 ppd. She has never successfully quit in the past. States that she knows she should but she is not ready at this time. She is not able to come into office for smoking cessation. She is using Anoro once daily. Using albuterol 4-5 times a day. Yesterday she only used it twice. She is taking mucinex twice daily. She has had more recent exacerbations of her COPD symptoms. PFTs showed moderately severe obstructive lung disease.    Observations/Objective:  - Moderate cough and some mild dyspnea while speaking on the phone   Office Spirometry 10/11/2016- severe obstructive airways disease-FVC 2.30/73%,  FEV1 1.21/49%, ratio 0.52  Assessment and Plan:  COPD mixed type: - Increased frequency of COPD exacerbations, recently treated with doxycycline course and prednisone taper. Reports improvement in cough.  - Plan Stop Anoro; start Trelegy Ellipta 100 one puff daily in the morning (rinse mouth after use) - Continue Mucinex 600mg  1-2 tabs twice daily  - Continue Albuterol hfa/nebulizer every 6 hours as needed for breakthrough shortness of breath/wheezing  - Consider adding Daliresp If continues to have AECOPD despite being on triple therapy   Follow Up Instructions:   - 4 weeks virtual visit with NP  I discussed the assessment and treatment plan with the patient. The patient was provided an opportunity to ask questions and all were answered. The patient agreed with the plan and demonstrated an understanding of the instructions.   The patient was advised to call back or seek an in-person evaluation if the symptoms worsen or if the condition fails to improve as anticipated.  I provided 22 minutes of non-face-to-face time during this encounter.   Waynetta Sandy, NP

## 2020-02-20 NOTE — Patient Instructions (Signed)
COPD: - Stop Anoro - Start Trelegy 1 puff daily (RX sent into pharmacy)  Follow-up: 4 weeks with Waynetta Sandy NP, virtual visit ok

## 2020-02-23 DIAGNOSIS — J449 Chronic obstructive pulmonary disease, unspecified: Secondary | ICD-10-CM | POA: Diagnosis not present

## 2020-02-27 DIAGNOSIS — J449 Chronic obstructive pulmonary disease, unspecified: Secondary | ICD-10-CM | POA: Diagnosis not present

## 2020-03-17 DIAGNOSIS — Z79899 Other long term (current) drug therapy: Secondary | ICD-10-CM | POA: Diagnosis not present

## 2020-03-19 NOTE — Progress Notes (Signed)
@Patient  ID: , female    DOB: 04-Jun-1961, 59 y.o.   MRN: [id number]  Chief Complaint  Patient presents with  . Follow-up    Trelegy works good.Sob x 1 mth., cough clear/green,wheezing.    Referring provider: [id number], DO  HPI: 59 year old female, current smoker.  Past medical history significant for COPD mixed type, chronic respiratory failure with hypoxia, anxiety.  Patient of Dr. 77. She is maintained on Combivent Respimat 1 puff every 6 hours as needed.  Appears she is been on Anoro in the past.  Previous [facility] pulmonary encounters: 02/20/2020 Patient contacted today for virtual telephone visit to follow-up COPD exacerbation.  Patient called our office on June 30 with reports of increased chest congestion, cough and wheezing.  She was sent in a prescription for doxycycline and prednisone taper.   She is feeling better. Completed doxycycline and prednisone course. Reports less congestion, she is getting brown mucus. She is still smoking 1 ppd. She has never successfully quit in the past. States that she knows she should but she is not ready at this time. She is not able to come into office for smoking cessation. She is using Anoro once daily. Using albuterol 4-5 times a day. Yesterday she only used it twice. She is taking mucinex twice daily. She has had more recent exacerbations of her COPD symptoms. PFTs showed moderately severe obstructive lung disease.    03/20/2020- Interim hx  Patient presented today for 4 week follow-up. Recently started on Trelegy Ellipta. Consider adding Daliresp if continues to have AECOPD despite being on triple therapy. Her breathing seemed better on Trelegy. She ran out of sample, needs prior 05/20/2020. She is taking ANORO right now. She currently has productive cough and wheezing. Mucinex helps. Leg/back pain exacerbates her breathing. She does not want to try daliresp d/t possible side effects including sleep difficulties, SI, nasuea, vomiting and  weight loss. Patient has not been vaccinated, took her mask off several times.    Allergies  Allergen Reactions  . Aspirin Nausea And Vomiting    Immunization History  Administered Date(s) Administered  . Hepb-cpg 08/16/2018, 09/18/2018  . Influenza Split 04/27/2011, 05/09/2012, 05/28/2015, 05/09/2016  . Influenza Whole 08/28/2009, 04/27/2010, 04/10/2019  . Influenza,inj,Quad PF,6+ Mos 04/25/2013, 05/16/2017, 05/25/2018  . Pneumococcal Polysaccharide-23 10/07/2011, 06/27/2018  . Tdap 07/15/2011    Past Medical History:  Diagnosis Date  . Allergic rhinitis, seasonal   . Allergy   . Anxiety   . Arthritis    hands, knees,neck, shoulders  . Asthma   . COPD (chronic obstructive pulmonary disease) (HCC)   . Depression    no meds  . Diabetes mellitus without complication (HCC)   . Fibromyalgia   . GERD (gastroesophageal reflux disease)   . Hepatitis C 2011  . Hypertension   . On home oxygen therapy    2 Liters as needed - is suppsoed to wear 24 hours but uses when gets short of breath with activity  . Oxygen deficiency    2 Liters   . Pneumonia   . Shortness of breath   . Sinusitis   . SVD (spontaneous vaginal delivery)    x 1    Tobacco History: Social History   Tobacco Use  Smoking Status Current Every Day Smoker  . Packs/day: 0.50  . Years: 40.00  . Pack years: 20.00  . Types: Cigarettes  Smokeless Tobacco Never Used  Tobacco Comment   1 pack per day    Ready to quit: Not  Answered Counseling given: Not Answered Comment: 1 pack per day    Outpatient Medications Prior to Visit  Medication Sig Dispense Refill  . albuterol (PROVENTIL) (2.5 MG/3ML) 0.083% nebulizer solution Take 3 mLs (2.5 mg total) by nebulization every 6 (six) hours as needed for wheezing or shortness of breath. 300 mL 5  . albuterol (VENTOLIN HFA) 108 (90 Base) MCG/ACT inhaler INHALE 2 PUFFS INTO THE LUNGS EVERY 4 HOURS AS NEEDED FOR WHEEZING OR SHORTNESS OF BREATH. 36 g 12  . ALPRAZolam  (XANAX) 1 MG tablet TAKE 1 TABLET BY MOUTH 3 TIMES A DAY AS NEEDED FOR ANXIETY 90 tablet 5  . amLODipine (NORVASC) 5 MG tablet TAKE 1 TABLET (5 MG TOTAL) BY MOUTH DAILY. 30 tablet 5  . bisoprolol-hydrochlorothiazide (ZIAC) 5-6.25 MG tablet TAKE 1 TABLET BY MOUTH DAILY. 30 tablet 2  . cetirizine (ZYRTEC) 10 MG tablet Take 1 tablet (10 mg total) by mouth daily. 10 tablet 12  . cholecalciferol (VITAMIN D3) 25 MCG (1000 UNIT) tablet Take 1,000 Units by mouth daily.    . COMBIVENT RESPIMAT 20-100 MCG/ACT AERS respimat INHALE 1 PUFF EVERY 6 HOURS AS NEEDED 4 g 4  . cyclobenzaprine (FLEXERIL) 10 MG tablet TAKE 1 TABLET BY MOUTH 3 TIMES DAILY AS NEEDED FOR MUSCLE SPASMS 90 tablet 3  . lisinopril (ZESTRIL) 20 MG tablet TAKE 1 TABLET BY MOUTH DAILY 30 tablet 1  . metFORMIN (GLUCOPHAGE) 500 MG tablet Take 1 tablet (500 mg total) by mouth 2 (two) times daily with a meal. 90 tablet 10  . Multiple Vitamin (MULTIVITAMIN WITH MINERALS) TABS Take 1 tablet by mouth daily.    Marland Kitchen omeprazole (PRILOSEC) 40 MG capsule TAKE 1 CAPSULE BY MOUTH DAILY. 30 capsule 3  . OVER THE COUNTER MEDICATION Take 1 tablet by mouth daily. musinex    . Oxycodone HCl 10 MG TABS Take 1 tablet by mouth 4 (four) times daily. Up to 15mg   0  . OXYGEN Inhale into the lungs daily.    . Fluticasone-Umeclidin-Vilant (TRELEGY ELLIPTA) 100-62.5-25 MCG/INH AEPB Inhale 1 puff into the lungs daily. (Patient not taking: Reported on 03/20/2020) 60 each 6   No facility-administered medications prior to visit.    Review of Systems  Review of Systems  Constitutional: Negative for chills and fever.  Respiratory: Positive for cough, shortness of breath and wheezing.     Physical Exam  BP 116/62 (BP Location: Left Arm)   Pulse 85   Temp (!) 97 F (36.1 C) (Oral)   Ht 5\' 2"  (1.575 m)   Wt 80 lb 3.2 oz (36.4 kg)   SpO2 95%   BMI 14.67 kg/m  Physical Exam Constitutional:      Appearance: Normal appearance.  HENT:     Head: Normocephalic and  atraumatic.     Mouth/Throat:     Comments: Deferred d/t masking- patient not vaccinated Cardiovascular:     Rate and Rhythm: Normal rate and regular rhythm.  Pulmonary:     Comments: Faint wheezing, diminished  Musculoskeletal:        General: Normal range of motion.  Skin:    General: Skin is warm and dry.  Neurological:     General: No focal deficit present.     Mental Status: She is alert and oriented to person, place, and time. Mental status is at baseline.  Psychiatric:        Mood and Affect: Mood normal.        Behavior: Behavior normal.  Thought Content: Thought content normal.        Judgment: Judgment normal.      Lab Results:  CBC    Component Value Date/Time   WBC 9.1 07/17/2018 1537   RBC 4.78 07/17/2018 1537   HGB 13.9 07/17/2018 1537   HGB 14.2 12/08/2017 1528   HCT 41.5 07/17/2018 1537   HCT 41.4 12/08/2017 1528   PLT 283.0 07/17/2018 1537   PLT 300 12/08/2017 1528   MCV 86.9 07/17/2018 1537   MCV 86 12/08/2017 1528   MCH 29.7 01/03/2018 1719   MCHC 33.6 07/17/2018 1537   RDW 14.2 07/17/2018 1537   RDW 14.1 12/08/2017 1528   LYMPHSABS 2.9 07/17/2018 1537   LYMPHSABS 4.0 (H) 12/08/2017 1528   MONOABS 1.1 (H) 07/17/2018 1537   EOSABS 0.1 07/17/2018 1537   EOSABS 0.1 12/08/2017 1528   BASOSABS 0.0 07/17/2018 1537   BASOSABS 0.0 12/08/2017 1528    BMET    Component Value Date/Time   NA 138 07/17/2018 1537   NA 136 12/08/2017 1528   K 4.0 07/17/2018 1537   CL 101 07/17/2018 1537   CO2 28 07/17/2018 1537   GLUCOSE 141 (H) 07/17/2018 1537   BUN 7 07/17/2018 1537   BUN 11 12/08/2017 1528   CREATININE 1.03 07/17/2018 1537   CREATININE 0.81 08/13/2015 1153   CALCIUM 9.2 07/17/2018 1537   GFRNONAA >60 01/03/2018 1719   GFRAA >60 01/03/2018 1719    BNP No results found for: BNP  ProBNP    Component Value Date/Time   PROBNP <30.0 LIPEMIC SPECIMEN 12/15/2007 1554    Imaging: No results found.   Assessment & Plan:   COPD mixed  type (HCC) - Frequent exacerbations. Last treated in July with doxycycline. Seen today for 1 month follow-up. Symptoms again consistent with acute exacerbation. She continues to smoke. Will treat her with Augmentin and prednisone taper. Patient is not interested in starting Daliresp d/t potential side effects with medication. She does reports improvement in her breathing with TRELEGY 100. She needs prior autho for medication. For now she will continue ANORO until approved. Other option would be Breztri. She has not been vaccinated for COVID-19. Encouraged her to reconsider but she declined. FU in 3 months with Dr. Maple Hudson or sooner if needed      Glenford Bayley, NP 03/20/2020

## 2020-03-20 ENCOUNTER — Other Ambulatory Visit: Payer: Self-pay

## 2020-03-20 ENCOUNTER — Ambulatory Visit (INDEPENDENT_AMBULATORY_CARE_PROVIDER_SITE_OTHER): Payer: Medicaid Other | Admitting: Primary Care

## 2020-03-20 ENCOUNTER — Telehealth: Payer: Self-pay | Admitting: Primary Care

## 2020-03-20 ENCOUNTER — Encounter: Payer: Self-pay | Admitting: Primary Care

## 2020-03-20 DIAGNOSIS — J449 Chronic obstructive pulmonary disease, unspecified: Secondary | ICD-10-CM | POA: Diagnosis not present

## 2020-03-20 MED ORDER — PREDNISONE 10 MG PO TABS: ORAL_TABLET | ORAL | 0 refills | Status: DC

## 2020-03-20 MED ORDER — AMOXICILLIN-POT CLAVULANATE 875-125 MG PO TABS
1.0000 | ORAL_TABLET | Freq: Two times a day (BID) | ORAL | 0 refills | Status: DC
Start: 2020-03-20 — End: 2020-04-01

## 2020-03-20 NOTE — Patient Instructions (Addendum)
Recommendations: - Continue Trelegy 1 puffs daily (ok to use anoro until PA approved) - Strongly encourage you quit smoking - Recommend covid vaccination - If not better you will needs CXR and covid testing   Rx: - Augmentin 1 tab twice daily x 7 days - Prednisone taper (40mg  x 3 days; 30mg  x 3 days; 20mg  x 3 days; 10mg  x 3 days)  Orders: - She needs prior authorization for Trelegy   Follow-up: - 3 months with Dr.    Chronic Obstructive Pulmonary Disease Chronic obstructive pulmonary disease (COPD) is a long-term (chronic) lung problem. When you have COPD, it is hard for air to get in and out of your lungs. Usually the condition gets worse over time, and your lungs will never return to normal. There are things you can do to keep yourself as healthy as possible.  Your doctor may treat your condition with: ? Medicines. ? Oxygen. ? Lung surgery.  Your doctor may also recommend: ? Rehabilitation. This includes steps to make your body work better. It may involve a team of specialists. ? Quitting smoking, if you smoke. ? Exercise and changes to your diet. ? Comfort measures (palliative care). Follow these instructions at home: Medicines  Take over-the-counter and prescription medicines only as told by your doctor.  Talk to your doctor before taking any cough or allergy medicines. You may need to avoid medicines that cause your lungs to be dry. Lifestyle  If you smoke, stop. Smoking makes the problem worse. If you need help quitting, ask your doctor.  Avoid being around things that make your breathing worse. This may include smoke, chemicals, and fumes.  Stay active, but remember to rest as well.  Learn and use tips on how to relax.  Make sure you get enough sleep. Most adults need at least 7 hours of sleep every night.  Eat healthy foods. Eat smaller meals more often. Rest before meals. Controlled breathing Learn and use tips on how to control your breathing as  told by your doctor. Try:  Breathing in (inhaling) through your nose for 1 second. Then, pucker your lips and breath out (exhale) through your lips for 2 seconds.  Putting one hand on your belly (abdomen). Breathe in slowly through your nose for 1 second. Your hand on your belly should move out. Pucker your lips and breathe out slowly through your lips. Your hand on your belly should move in as you breathe out.  Controlled coughing Learn and use controlled coughing to clear mucus from your lungs. Follow these steps: 1. Lean your head a little forward. 2. Breathe in deeply. 3. Try to hold your breath for 3 seconds. 4. Keep your mouth slightly open while coughing 2 times. 5. Spit any mucus out into a tissue. 6. Rest and do the steps again 1 or 2 times as needed. General instructions  Make sure you get all the shots (vaccines) that your doctor recommends. Ask your doctor about a flu shot and a pneumonia shot.  Use oxygen therapy and pulmonary rehabilitation if told by your doctor. If you need home oxygen therapy, ask your doctor if you should buy a tool to measure your oxygen level (oximeter).  Make a COPD action plan with your doctor. This helps you to know what to do if you feel worse than usual.  Manage any other conditions you have as told by your doctor.  Avoid going outside when it is very hot, cold, or humid.  Avoid people who have  a sickness you can catch (contagious).  Keep all follow-up visits as told by your doctor. This is important. Contact a doctor if:  You cough up more mucus than usual.  There is a change in the color or thickness of the mucus.  It is harder to breathe than usual.  Your breathing is faster than usual.  You have trouble sleeping.  You need to use your medicines more often than usual.  You have trouble doing your normal activities such as getting dressed or walking around the house. Get help right away if:  You have shortness of breath while  resting.  You have shortness of breath that stops you from: ? Being able to talk. ? Doing normal activities.  Your chest hurts for longer than 5 minutes.  Your skin color is more blue than usual.  Your pulse oximeter shows that you have low oxygen for longer than 5 minutes.  You have a fever.  You feel too tired to breathe normally. Summary  Chronic obstructive pulmonary disease (COPD) is a long-term lung problem.  The way your lungs work will never return to normal. Usually the condition gets worse over time. There are things you can do to keep yourself as healthy as possible.  Take over-the-counter and prescription medicines only as told by your doctor.  If you smoke, stop. Smoking makes the problem worse. This information is not intended to replace advice given to you by your health care provider. Make sure you discuss any questions you have with your health care provider. Document Revised: 07/08/2017 Document Reviewed: 08/30/2016 Elsevier Patient Education  2020 [employer].

## 2020-03-20 NOTE — Telephone Encounter (Signed)
Patient needs PA for Trelegy 100. RN needs assistance. Please reorient when you return from lunch. Thank you

## 2020-03-20 NOTE — Assessment & Plan Note (Addendum)
-   Frequent exacerbations. Last treated in July with doxycycline. Seen today for 1 month follow-up. Symptoms again consistent with acute exacerbation. She continues to smoke. Will treat her with Augmentin and prednisone taper. Patient is not interested in starting Daliresp d/t potential side effects with medication. She does reports improvement in her breathing with TRELEGY 100. She needs prior autho for medication. For now she will continue ANORO until approved. Other option would be Breztri. She has not been vaccinated for COVID-19. Encouraged her to reconsider but she declined. FU in 3 months with Dr. Maple Hudson or sooner if needed

## 2020-03-20 NOTE — Telephone Encounter (Signed)
Medication name and strength: Trelegy 100 Provider: Shana Chute Pharmacy: [facility] Patient insurance ID: [id number] Q Phone: [phone] Fax: [phone]  Was the PA started on CMM?  No/[place] [employer] If yes, please enter the Key: N/A Timeframe for approval/denial: Will receive confirmation fax once decision has been made. Decision may take 3-5 business days.  Patient is enrolled in care management plan/[employer]  PA case# [id number]

## 2020-03-24 ENCOUNTER — Other Ambulatory Visit: Payer: Self-pay

## 2020-03-25 ENCOUNTER — Telehealth: Payer: Self-pay | Admitting: Internal Medicine

## 2020-03-25 NOTE — Telephone Encounter (Signed)
Patient returns call to nurse line and scheduled follow up appointment for Monday 8/23. Patient is requesting partial refill until scheduled appointment.   To PCP  Veronda Prude, RN

## 2020-03-25 NOTE — Telephone Encounter (Signed)
Called patient and informed her we could not refill flexeril as we are not the prescribing office. Patient verbalized understanding.

## 2020-03-26 ENCOUNTER — Other Ambulatory Visit: Payer: Self-pay | Admitting: Family Medicine

## 2020-03-26 MED ORDER — CYCLOBENZAPRINE HCL 10 MG PO TABS: 10.0000 mg | ORAL_TABLET | Freq: Three times a day (TID) | ORAL | 0 refills | Status: DC | PRN

## 2020-03-26 NOTE — Telephone Encounter (Signed)
Sent refill for 30 count to hold patient until appointment.

## 2020-03-28 ENCOUNTER — Ambulatory Visit: Payer: Medicaid Other | Admitting: Internal Medicine

## 2020-03-31 ENCOUNTER — Telehealth: Payer: Self-pay | Admitting: Family Medicine

## 2020-03-31 ENCOUNTER — Encounter: Payer: Self-pay | Admitting: Family Medicine

## 2020-03-31 ENCOUNTER — Other Ambulatory Visit: Payer: Self-pay

## 2020-03-31 ENCOUNTER — Ambulatory Visit (INDEPENDENT_AMBULATORY_CARE_PROVIDER_SITE_OTHER): Payer: Medicaid Other | Admitting: Family Medicine

## 2020-03-31 VITALS — BP 126/84 | HR 102 | Ht 62.0 in | Wt 85.0 lb

## 2020-03-31 DIAGNOSIS — I10 Essential (primary) hypertension: Secondary | ICD-10-CM | POA: Diagnosis not present

## 2020-03-31 DIAGNOSIS — G8929 Other chronic pain: Secondary | ICD-10-CM | POA: Diagnosis not present

## 2020-03-31 DIAGNOSIS — M545 Low back pain, unspecified: Secondary | ICD-10-CM

## 2020-03-31 DIAGNOSIS — E119 Type 2 diabetes mellitus without complications: Secondary | ICD-10-CM | POA: Diagnosis not present

## 2020-03-31 DIAGNOSIS — R4589 Other symptoms and signs involving emotional state: Secondary | ICD-10-CM | POA: Diagnosis not present

## 2020-03-31 LAB — POCT GLYCOSYLATED HEMOGLOBIN (HGB A1C): HbA1c, POC (controlled diabetic range): 5.5 % (ref 0.0–7.0)

## 2020-03-31 MED ORDER — CYCLOBENZAPRINE HCL 10 MG PO TABS: 10.0000 mg | ORAL_TABLET | Freq: Three times a day (TID) | ORAL | 1 refills | Status: DC | PRN

## 2020-03-31 NOTE — Assessment & Plan Note (Signed)
Assessment: Patient with elevated PHQ-9 score of 21 with passive thoughts of being better off dead.  Patient not on any medication for depression and states that all of these symptoms are due to her chronic medical conditions.  Patient denies having any plan for suicide and states that she would never commit suicide as she has a grand child.  Patient states that she is looking forward to a vacation with some family coming up this week.  She is not interested in medication at this time but states that she may consider it down the road. Plan: -Discussed the treatment options for depression including counseling and medications -Provided patient with suicide resources should she develop a crisis prior to her next appointment

## 2020-03-31 NOTE — Patient Instructions (Signed)
It was great to see you!  Our plans for today:  - Since we discussed the pros and cons of the flexeril and I am confident that you understand these and the risks of continuing it but that you also get benefit from it currently I will refill that as needed. - For your history of COPD and your upcoming trip I would recommend calling our clinic if you have a flare on your trip and as per our discussion I will call in antibiotics and steroids for your flare at that time.  - Should you get any thoughts of suicide I do recommend you call us or reach out to the resources below.  - Your A1C is excellent at 5.5  Take care and seek immediate care sooner if you develop any concerns.   Dr. [facility]  If you are feeling suicidal or depression symptoms worsen please immediately go to:   24 Hour Availability [facility]  [address] [place], [place] [phone] Crisis [phone]    . If you are thinking about harming yourself or having thoughts of suicide, or if you know someone who is, seek help right away. . Call your doctor or mental health care provider. . Call 911 or go to a hospital emergency room to get immediate help, or ask a friend or family member to help you do these things. . Call the [place] National Suicide Prevention Lifeline's toll-free, 24-hour hotline at 1-800-273-TALK (979)372-9141) or TTY: 1-800-799-4 TTY (225)232-6029) to talk to a trained counselor. . If you are in crisis, make sure you are not left alone.  . If someone else is in crisis, make sure he or she is not left alone   [facility] [employer] (Domestic Violence, Rape & Victim Assistance [phone]  [facility] [facility]    (ONLY from 8am-4pm)    [phone]  [facility] Mobile Crisis Unit (24/7)   [phone]  [place] National Suicide Hotline   (613)634-8344 Len Childs)

## 2020-03-31 NOTE — Telephone Encounter (Signed)
Will forward to MD. Esma Kilts,CMA  

## 2020-03-31 NOTE — Assessment & Plan Note (Signed)
Assessment: Patient with chronic low back pain with medications including oxycodone, Flexeril.  Patient received her oxycodone from pain clinic, and endorses that she also gets Xanax for anxiety from another physician.  Patient states that she understands that Flexeril can cause drowsiness, potentially reduced respiratory drive when used in combination of oxycodone and Xanax and that this can also increase her risk of falls, particularly as she advances in age.  Patient states that while she understands all of these risk this medication in her opinion provides her an improvement in her life and she requested continuing this medication.  She voices that she fully understands the risk of doing so but that she does not believe she would be able to function at her current level if this medication was removed.  She also states that she is used it for many years without any issue. Plan: -Discussed in detail the pros and cons of continuing this medication.  I explained to the patient through shared decision making that as she seems to understand these risks, and that because that she feels this medication does provide an improvement over her life with her many chronic medical conditions that I will refill this medication.

## 2020-03-31 NOTE — Telephone Encounter (Signed)
Pt had an appt today; states she need an authorization on her medication

## 2020-03-31 NOTE — Progress Notes (Signed)
SUBJECTIVE:   CHIEF COMPLAINT / HPI:   Chronic back pain Patient presents today for evaluation after requesting refill for Flexeril which she has been on long-term for chronic back pain.  Patient also takes oxycodone and Per PDMP gets 120 count of 15 mg monthly.  In addition to this patient takes Xanax for anxiety receiving 90 count 1 mg monthly per PDMP.  Patient states that she uses the Flexeril due to her chronic back pain as well as pain from chronic COPD.  She states that she is used this for many years and has never had an issue for her.  She states that she feels very reliant on this medication and that she understands there are potential adverse effects particularly when taking this medication with oxycodone and Xanax.  She states that regardless of this she feels this medication improves her life and this is something that she wishes to continue taking.  Hypertension Current home medications include amlodipine 5 mg, is bisoprolol-hydrochlorothiazide 5-6.25 mg, lisinopril 20 mg daily.  Per chart review it appears the patient was recommended against lisinopril in the past due to being a difficult intubation.  Patient denies history of angioedema, states that she does not have any issue with lisinopril was taken for many years and would like to continue with it.  Depression Patient with elevated PHQ-9 score of 21 with "2" on question #9 about thoughts of being better off dead.  Patient states that she has no plan for suicide and "would never do that as she has a grandchild" but that due to her chronic medical conditions she often wonders if she would be better off dead.  She states that she has no actual plans for suicide and has never thought of the way that she would harm herself.  She states that she is looking forward to a beach trip coming up on Thursday with some family members.  On 2 L nasal cannula at home due to chronic COPD.  PERTINENT  PMH / PSH: History of COPD  OBJECTIVE:    BP 126/84   Pulse (!) 102   Ht 5\' 2"  (1.575 m)   Wt 85 lb (38.6 kg)   SpO2 91%   BMI 15.55 kg/m    Pulse recheck: 90  General: NAD, pleasant, able to participate in exam Cardiac: RRR, no murmurs. Respiratory: Mostly clear, but with reduced air movement diffusely.  Patient on 2 L portable oxygen with chronic cough present. Neuro: alert, no obvious focal deficits Psych: Normal affect and mood  ASSESSMENT/PLAN:   Essential hypertension Assessment: Well-controlled blood pressure with 126/84 today in the office.  Home medications include amlodipine 5 mg, lisinopril 20 mg, bisoprolol-hydrochlorothiazide 5-6.25 milligrams daily.  Per chart review there is recommendation from my previous physician that patient avoid lisinopril as she had a difficult intubation many years ago.  Discussed this with patient patient states that she has been on lisinopril for many years and has had good results with this and does not wish to change this medication. Plan: -We will continue current medications  Chronic low back pain without sciatica Assessment: Patient with chronic low back pain with medications including oxycodone, Flexeril.  Patient received her oxycodone from pain clinic, and endorses that she also gets Xanax for anxiety from another physician.  Patient states that she understands that Flexeril can cause drowsiness, potentially reduced respiratory drive when used in combination of oxycodone and Xanax and that this can also increase her risk of falls, particularly as she  advances in age.  Patient states that while she understands all of these risk this medication in her opinion provides her an improvement in her life and she requested continuing this medication.  She voices that she fully understands the risk of doing so but that she does not believe she would be able to function at her current level if this medication was removed.  She also states that she is used it for many years without any  issue. Plan: -Discussed in detail the pros and cons of continuing this medication.  I explained to the patient through shared decision making that as she seems to understand these risks, and that because that she feels this medication does provide an improvement over her life with her many chronic medical conditions that I will refill this medication.   Depressed mood Assessment: Patient with elevated PHQ-9 score of 21 with passive thoughts of being better off dead.  Patient not on any medication for depression and states that all of these symptoms are due to her chronic medical conditions.  Patient denies having any plan for suicide and states that she would never commit suicide as she has a grand child.  Patient states that she is looking forward to a vacation with some family coming up this week.  She is not interested in medication at this time but states that she may consider it down the road. Plan: -Discussed the treatment options for depression including counseling and medications -Provided patient with suicide resources should she develop a crisis prior to her next appointment   COPD: Assessment: Patient with a history of COPD with a recent treatment for COPD exacerbation which patient just finished her course of antibiotics.  Patient states that she is going to the beach on Thursday and request having a prescription for antibiotics and for steroids in case she develops COPD exacerbation while at the beach.  Discussed with preceptor and with patient in detail and recommended to patient that should this happen if she will call the clinic and let me know I will send her in an antibiotic and steroid until she can return for further evaluation.  A1c 5.5  Patient states she is not interested in COVID-19 vaccination  Jackelyn Poling, DO [facility]

## 2020-03-31 NOTE — Assessment & Plan Note (Signed)
Assessment: Well-controlled blood pressure with 126/84 today in the office.  Home medications include amlodipine 5 mg, lisinopril 20 mg, bisoprolol-hydrochlorothiazide 5-6.25 milligrams daily.  Per chart review there is recommendation from my previous physician that patient avoid lisinopril as she had a difficult intubation many years ago.  Discussed this with patient patient states that she has been on lisinopril for many years and has had good results with this and does not wish to change this medication. Plan: -We will continue current medications

## 2020-04-01 ENCOUNTER — Other Ambulatory Visit: Payer: Self-pay | Admitting: Family Medicine

## 2020-04-01 MED ORDER — AMOXICILLIN-POT CLAVULANATE 875-125 MG PO TABS: 1.0000 | ORAL_TABLET | Freq: Two times a day (BID) | ORAL | 0 refills | Status: DC

## 2020-04-01 MED ORDER — PREDNISONE 20 MG PO TABS: 40.0000 mg | ORAL_TABLET | Freq: Every day | ORAL | 0 refills | Status: AC

## 2020-04-01 NOTE — Progress Notes (Signed)
Called patient to follow up after receiving message that pharmacy was waiting to refill medication until closer to time. Patient states she understands this but reiterates from our appointment yesterday that she would prefer to have a prednisone and antibiotic rx called in to her pharmacy for when she takes her vacation trip out of state later this week, in case she gets ill on her vacation. Patient has a long history of worsening copd with multiple exacerbations documented including earlier this month. After discussing options regarding this with preceptor yesterday and given patient's long history of copd with exacerbations with prescribe prednisone 40mg  for 5 days as well as Augmentin for 10 days.  Discussed this with Dr. and followed up with patient for further discussion on the pros and cons of this. Given her multiple co-morbidities and overall life expectancy I agree that providing these prescriptions in case she has a copd exacerbation while out of state on vacation would provide the best patient care. Patient plans to follow up with Leveda Anna as soon as she is back in town should she feel the need to start taking these two medications while on vacation due to copd exacerbation as well as have a low threshold to go to a local emergency department if she has symptoms while out of town which do not quickly improve once starting the medications.

## 2020-04-09 DIAGNOSIS — J449 Chronic obstructive pulmonary disease, unspecified: Secondary | ICD-10-CM | POA: Diagnosis not present

## 2020-04-10 ENCOUNTER — Other Ambulatory Visit: Payer: Self-pay | Admitting: Family Medicine

## 2020-04-16 ENCOUNTER — Telehealth: Payer: Self-pay | Admitting: Internal Medicine

## 2020-04-16 DIAGNOSIS — Z79899 Other long term (current) drug therapy: Secondary | ICD-10-CM | POA: Diagnosis not present

## 2020-04-16 MED ORDER — DOXYCYCLINE HYCLATE 100 MG PO TABS: 100.0000 mg | ORAL_TABLET | Freq: Two times a day (BID) | ORAL | 0 refills | Status: DC

## 2020-04-16 MED ORDER — PREDNISONE 10 MG PO TABS: ORAL_TABLET | ORAL | 0 refills | Status: DC

## 2020-04-16 MED ORDER — BREZTRI AEROSPHERE 160-9-4.8 MCG/ACT IN AERO: 2.0000 | INHALATION_SPRAY | Freq: Two times a day (BID) | RESPIRATORY_TRACT | 0 refills | Status: DC

## 2020-04-16 NOTE — Telephone Encounter (Signed)
Spoke with the pt  She states that she has been having increased wheezing and SOB since she got home from the beach 2 days ago  She states not coughing or having f/c/s or body aches  She has not had a covid vaccine  She states her insurance medicaid is not covering the trelegy so she has not been taking this  Please advise thanks

## 2020-04-16 NOTE — Telephone Encounter (Signed)
Send in Doxycycline 100 mg BID x 7 days Use sun block if in sun Prednisone taper; 10 mg tablets: 4 tabs x 2 days, 3 tabs x 2 days, 2 tabs x 2 days 1 tab x 2 days then stop. If she is diabetic, this will increase her blood sugars and she needs to be aware.  We will leave Breztri samples at the front  desk.. They can pick up as early as 8 am ( 2 puffs twice a day) tomorrow morning. She needs to take this medication while she is flaring.  She needs a follow OV up in 2 weeks  To ensure she is better. Please contact office for sooner follow up if symptoms do not improve or worsen or seek emergency care

## 2020-04-16 NOTE — Telephone Encounter (Signed)
Called and spoke with pt letting her know that we were sending Rx for both prednisone and doxycycline to pharmacy for her. Also stated to pt that we were placing sample of Breztri up front for her to do a trial of to see if it works better than the Trelegy and she verbalized understanding. Also stated to pt that we needed to schedule an appt in 2 weeks to see how she was doing. I let her know that I was going to check with CY to see if he is okay for [place] to add pt in one of RNA slots in 2 weeks and she verbalized understanding.  Dr. Maple Hudson, can we add pt on your schedule in 2 weeks in one of the held RNA slots?

## 2020-04-16 NOTE — Addendum Note (Signed)
Addended by: Wyvonne Lenz on: 04/16/2020 05:53 PM   Modules accepted: Orders

## 2020-05-09 ENCOUNTER — Telehealth: Payer: Self-pay | Admitting: Internal Medicine

## 2020-05-09 ENCOUNTER — Other Ambulatory Visit: Payer: Self-pay | Admitting: Family Medicine

## 2020-05-09 DIAGNOSIS — J449 Chronic obstructive pulmonary disease, unspecified: Secondary | ICD-10-CM | POA: Diagnosis not present

## 2020-05-09 MED ORDER — ALPRAZOLAM 1 MG PO TABS
ORAL_TABLET | ORAL | 5 refills | Status: DC
Start: 2020-05-09 — End: 2020-07-19

## 2020-05-09 NOTE — Telephone Encounter (Signed)
Xanax refilled.  

## 2020-05-09 NOTE — Telephone Encounter (Signed)
Called and spoke with patient who states she needs a refill of her Xanax sent to the pharmacy.   Dr. Maple Hudson if you are ok with refilling this please sign pended order below.

## 2020-05-12 NOTE — Telephone Encounter (Signed)
Called and spoke with pt letting her know that CY refilled her xanax Rx for her and she verbalized understanding. Nothing further needed.

## 2020-05-13 ENCOUNTER — Telehealth: Payer: Self-pay | Admitting: Internal Medicine

## 2020-05-13 MED ORDER — PREDNISONE 10 MG PO TABS: ORAL_TABLET | ORAL | 0 refills | Status: DC

## 2020-05-13 NOTE — Telephone Encounter (Signed)
Offer prednisone 10 mg, # 20  4 X 2 DAYS, 3 X 2 DAYS, 2 X 2 DAYS, 1 X 2 DAYS   Ok to stay on O2 while needed Suggest she look for a pulse oximeter at drug store for future use. Please tell her I really want her to stop smoking. Thanks

## 2020-05-13 NOTE — Telephone Encounter (Signed)
Called and spoke with pt letting her know the info stated by CY and she verbalized understanding. Rx for prednisone has been sent to pharmacy for pt. Nothing further needed.

## 2020-05-13 NOTE — Telephone Encounter (Signed)
Spoke with pt. States that she is having increased DOE. Reports having to use her oxygen more often than not. She is using 2L continuous, at times she has to use 3L continuous. Pt does not have a way of checking her oxygen level. Denies chest tightness, wheezing or coughing. Pt would like to know if CY would give her prednisone to see if it will help.  CY - please advise.   Allergies  Allergen Reactions  . Aspirin Nausea And Vomiting

## 2020-05-14 DIAGNOSIS — Z79899 Other long term (current) drug therapy: Secondary | ICD-10-CM | POA: Diagnosis not present

## 2020-05-19 ENCOUNTER — Telehealth: Payer: Self-pay | Admitting: Internal Medicine

## 2020-05-20 ENCOUNTER — Other Ambulatory Visit: Payer: Self-pay | Admitting: Internal Medicine

## 2020-05-20 MED ORDER — BUPROPION HCL ER (SR) 150 MG PO TB12: 150.0000 mg | ORAL_TABLET | Freq: Two times a day (BID) | ORAL | 1 refills | Status: AC

## 2020-05-20 NOTE — Telephone Encounter (Signed)
We can offer Wellbutrin SR 150 mg, # 60, twice daily, ref x 1  We can offer referral to our Pharmacy smoking cessation team, if that is available now. She has llmited finances and transportation.

## 2020-05-20 NOTE — Telephone Encounter (Signed)
Pt decided to go with wellbutrin 150mg  # 60tabs ref X1 decided to try medication.pt was informed sent  To pharmacy verbalized understanding

## 2020-05-20 NOTE — Telephone Encounter (Signed)
Spoke with the pt  She is asking what would be the best option for her as an aid to help her stop smoking  She has tried patches before and states they helped a little  Please advise, thanks

## 2020-05-29 ENCOUNTER — Telehealth: Payer: Self-pay | Admitting: Internal Medicine

## 2020-05-29 MED ORDER — PREDNISONE 10 MG PO TABS: 10.0000 mg | ORAL_TABLET | Freq: Every day | ORAL | 0 refills | Status: DC

## 2020-05-29 NOTE — Telephone Encounter (Signed)
Called and spoke with Patient.  Dr Roxy Cedar recommendations given.  Understanding stated.  Prednisone prescription sent to requested [facility].  Nothing further at this time.

## 2020-05-29 NOTE — Telephone Encounter (Signed)
Se has an appointment to see me in a couple of weeks and should keep that.  Until then, offer prednisone 10 mg, # 20, 1 daily, no refill

## 2020-05-29 NOTE — Telephone Encounter (Signed)
Primary Pulmonologist: young Last office visit and with whom: Megan Soto 03/20/20 What do we see them for (pulmonary problems): COPD Last OV assessment/plan:  Recommendations: - Continue Trelegy 1 puffs daily (ok to use anoro until PA approved) - Strongly encourage you quit smoking - Recommend covid vaccination - If not better you will needs CXR and covid testing    Rx: - Augmentin 1 tab twice daily x 7 days - Prednisone taper (40mg  x 3 days; 30mg  x 3 days; 20mg  x 3 days; 10mg  x 3 days)   Orders: - She needs prior authorization for Trelegy    Follow-up: - 3 months with Dr.    Was appointment offered to patient (explain)?  no   Reason for call: Spoke with pt c/o increased SOB on exertion. Using oxygen PRN at 2.5L. Prod cough (green in am and white throughout the day) Denies FCS. Using Neb occasionally because it causes jittery. Pt took her Daughter's prednisone pills 3 tabs yesterday and 2 tabs today but wasn't sure of the strength. Pt thinking she may need more Prednisone. Please advise.   (examples of things to ask: : When did symptoms start? Fever? Cough? Productive? Color to sputum? More sputum than usual? Wheezing? Have you needed increased oxygen? Are you taking your respiratory medications? What over the counter measures have you tried?)  Allergies  Allergen Reactions  . Aspirin Nausea And Vomiting    Immunization History  Administered Date(s) Administered  . Hepb-cpg 08/16/2018, 09/18/2018  . Influenza Split 04/27/2011, 05/09/2012, 05/28/2015, 05/09/2016  . Influenza Whole 08/28/2009, 04/27/2010, 04/10/2019  . Influenza,inj,Quad PF,6+ Mos 04/25/2013, 05/16/2017, 05/25/2018  . Pneumococcal Polysaccharide-23 10/07/2011, 06/27/2018  . Tdap 07/15/2011

## 2020-05-31 ENCOUNTER — Other Ambulatory Visit: Payer: Self-pay | Admitting: Family Medicine

## 2020-06-10 ENCOUNTER — Other Ambulatory Visit: Payer: Self-pay | Admitting: Family Medicine

## 2020-06-13 DIAGNOSIS — Z79899 Other long term (current) drug therapy: Secondary | ICD-10-CM | POA: Diagnosis not present

## 2020-06-16 ENCOUNTER — Telehealth: Payer: Self-pay | Admitting: Internal Medicine

## 2020-06-16 MED ORDER — PREDNISONE 10 MG PO TABS: 10.0000 mg | ORAL_TABLET | Freq: Every day | ORAL | 1 refills | Status: AC

## 2020-06-16 NOTE — Telephone Encounter (Signed)
Ok to send prednisone 10 mg, # 30, 1 daily, ref x 1

## 2020-06-16 NOTE — Telephone Encounter (Signed)
Rx for prednisone has been sent to preferred pharmacy for pt. Called and spoke with pt letting her know this had been done and she verbalized understanding. Nothing further needed.

## 2020-06-16 NOTE — Telephone Encounter (Signed)
Called and spoke with pt's daughter Crystal. Crystal stated that pt was requesting a refill of her prednisone. Pt had to take more than one pill a day with last refill that was sent.  Crystal stated she believed pt finished prednisone about 1 week ago.  Dr. Maple Hudson, please advise if you are okay with [place] sending Rx for prednisone to pharmacy for pt.  Allergies  Allergen Reactions  . Aspirin Nausea And Vomiting     Current Outpatient Medications:  .  amLODipine (NORVASC) 5 MG tablet, TAKE 1 TABLET BY MOUTH DAILY., Disp: 30 tablet, Rfl: 5 .  lisinopril (ZESTRIL) 20 MG tablet, TAKE 1 TABLET BY MOUTH DAILY, Disp: 30 tablet, Rfl: 1 .  metFORMIN (GLUCOPHAGE) 1000 MG tablet, TAKE 1/2 TABLET BY MOUTH TWICE A DAY FOR DIABETES. (AFTER BREAKFAST AND AFTER SUPPER), Disp: 30 tablet, Rfl: 10 .  omeprazole (PRILOSEC) 40 MG capsule, TAKE 1 CAPSULE BY MOUTH DAILY., Disp: 30 capsule, Rfl: 3 .  albuterol (PROVENTIL) (2.5 MG/3ML) 0.083% nebulizer solution, Take 3 mLs (2.5 mg total) by nebulization every 6 (six) hours as needed for wheezing or shortness of breath., Disp: 300 mL, Rfl: 5 .  albuterol (VENTOLIN HFA) 108 (90 Base) MCG/ACT inhaler, INHALE 2 PUFFS INTO THE LUNGS EVERY 4 HOURS AS NEEDED FOR WHEEZING OR SHORTNESS OF BREATH., Disp: 36 g, Rfl: 12 .  ALPRAZolam (XANAX) 1 MG tablet, TAKE 1 TABLET BY MOUTH 3 TIMES A DAY AS NEEDED FOR ANXIETY, Disp: 90 tablet, Rfl: 5 .  amoxicillin-clavulanate (AUGMENTIN) 875-125 MG tablet, Take 1 tablet by mouth 2 (two) times daily., Disp: 14 tablet, Rfl: 0 .  bisoprolol-hydrochlorothiazide (ZIAC) 5-6.25 MG tablet, TAKE 1 TABLET BY MOUTH DAILY., Disp: 30 tablet, Rfl: 2 .  Budeson-Glycopyrrol-Formoterol (BREZTRI AEROSPHERE) 160-9-4.8 MCG/ACT AERO, Inhale 2 puffs into the lungs in the morning and at bedtime., Disp: 5.9 g, Rfl: 0 .  buPROPion (WELLBUTRIN SR) 150 MG 12 hr tablet, Take 1 tablet (150 mg total) by mouth 2 (two) times daily., Disp: 60 tablet, Rfl: 1 .  cetirizine  (ZYRTEC) 10 MG tablet, Take 1 tablet (10 mg total) by mouth daily., Disp: 10 tablet, Rfl: 12 .  cholecalciferol (VITAMIN D3) 25 MCG (1000 UNIT) tablet, Take 1,000 Units by mouth daily., Disp: , Rfl:  .  COMBIVENT RESPIMAT 20-100 MCG/ACT AERS respimat, INHALE 1 PUFF EVERY 6 HOURS AS NEEDED, Disp: 4 g, Rfl: 4 .  cyclobenzaprine (FLEXERIL) 10 MG tablet, TAKE 1 TABLET BY MOUTH 3 TIMES DAILY AS NEEDED FOR MUSCLE SPASMS., Disp: 54 tablet, Rfl: 1 .  doxycycline (VIBRA-TABS) 100 MG tablet, Take 1 tablet (100 mg total) by mouth 2 (two) times daily., Disp: 14 tablet, Rfl: 0 .  Fluticasone-Umeclidin-Vilant (TRELEGY ELLIPTA) 100-62.5-25 MCG/INH AEPB, Inhale 1 puff into the lungs daily. (Patient not taking: Reported on 03/20/2020), Disp: 60 each, Rfl: 6 .  Multiple Vitamin (MULTIVITAMIN WITH MINERALS) TABS, Take 1 tablet by mouth daily., Disp: , Rfl:  .  OVER THE COUNTER MEDICATION, Take 1 tablet by mouth daily. musinex, Disp: , Rfl:  .  Oxycodone HCl 10 MG TABS, Take 1 tablet by mouth 4 (four) times daily. Up to 15mg , Disp: , Rfl: 0 .  OXYGEN, Inhale into the lungs daily., Disp: , Rfl:  .  predniSONE (DELTASONE) 10 MG tablet, Take 4tabsx2days, 3tabsx2days, 2tabsx2days, 1tabx2days, then stop, Disp: 20 tablet, Rfl: 0 .  predniSONE (DELTASONE) 10 MG tablet, Take 1 tablet (10 mg total) by mouth daily with breakfast., Disp: 20 tablet, Rfl: 0

## 2020-06-22 NOTE — Progress Notes (Signed)
Patient ID: Megan Soto, female    DOB: January 31, 1961, 59 y.o.   MRN: [id number]  HPI  female smoker followed for COPD, complicated by anxiety, DM, GERD, musculoskeletal pain/pain clinic, hepatitis C Walk test on room air 10/11/16 -Room air resting saturation 88%, desaturated to 86% walking on room air, recovered on 3 L to 97% Office Spirometry 10/11/2016-severe obstructive airways disease-FVC 2.30/73%, FEV1 1.21/49%, ratio 0.52 --------------------------------------------------------------------------------------  09/26/19- 59 year old female smoker followed for COPD, hypoxic respiratory failure, complicated by Anxiety, DM2, GERD, musculoskeletal pain/pain clinic, Hepatitis C O2  2L sleep and prn/  LIncare, Alprazolam,   Anoro, Albuterol hfa, Neb albuterol, Combivent,  -----f/u COPD mixed type Smoking 1 ppd against advice. Chantix didn't help. Not trying to quit.  Daily productive cough white/ green, no blood or fever. Discussed use of Anoro, rescue albuterol and Combivent, noting overlap. Occ uses neb. Hep C is stable. Anxiety controlled- worse while cold weather keeps her indoors. Teeth in bad repair. Trying to get a local dentist who takes Medicaid. Had flu vax, pending Covid vax. She asks depomedrol today and amoxacillin to hold- discussed.   06/23/20- 59 year old female Smoker followed for COPD, hypoxic respiratory failure, complicated by Anxiety, DM2, GERD, musculoskeletal pain/pain clinic, Hepatitis C O2  2L sleep and prn/  LIncare, Alprazolam,   Anoro, Albuterol hfa, Neb albuterol, Combivent, prednisone 10 mg daily, Trelegy 100,  Covid vax- none Flu vax- today standard -----sob on exertion ,pt states fatigue , coughing up green and brown mucus, pt takes 2 prednisone daily Denies fever. Cough productive and discolored 2-3 days. Losing weight and feeling weak with DOE on O2 2-3L. No blood, nodes or edema.  CXR 09/26/19 IMPRESSION: Pulmonary hyperinflation and emphysema without acute  abnormality of the lungs.  ROS-see HPI   + = positive Constitutional:    weight loss, night sweats, fevers, chills, fatigue, lassitude. HEENT:    headaches, difficulty swallowing, tooth/dental problems, sore throat,       sneezing, itching, ear ache, nasal congestion, post nasal drip, snoring CV:    chest pain, orthopnea, PND, swelling in lower extremities, anasarca,                                               dizziness, palpitations Resp:  + shortness of breath with exertion or at rest.                +productive cough,   + non-productive cough, coughing up of blood.              change in color of mucus.   wheezing.   Skin:    rash or lesions. GI:  No-   heartburn, indigestion, abdominal pain, nausea, vomiting, GU: dysuria, change in color of urine, no urgency or frequency.   flank pain. MS:  + joint pain, stiffness, decreased range of motion, back pain. Neuro-     nothing unusual Psych:  change in mood or affect.  +depression or anxiety.   memory loss.  OBJ- Physical Exam General- Alert, Oriented, Affect-pleasant/  anxious, Distress- none acute,  + petite. +Has aged considerably since last here. Frail. O2 3L has POC. Skin- rash-none, lesions- none, excoriation- none Lymphadenopathy- none Head- atraumatic            Eyes- Gross vision intact, PERRLA, conjunctivae and secretions clear  Ears- Hearing, canals-normal            Nose- Clear, no-Septal dev, mucus, polyps, erosion, perforation             Throat- Mallampati II , mucosa clear , drainage- none, tonsils- atrophic, + missing teeth ,  Neck- flexible , trachea midline, no stridor , thyroid nl, carotid no bruit Chest - symmetrical excursion , unlabored, no masses           Heart/CV- RRR , no murmur , no gallop  , no rub, nl s1 s2                           - JVD- none , edema- none, stasis changes- none, varices- none           Lung-  Cough + deep, rhonchi+few, wheeze-none, dullness-none, rub- none Abd-  Br/ Gen/  Rectal- Not done, not indicated Extrem- cyanosis- none, clubbing, none, atrophy- none, strength- nl Neuro- grossly intact to observation

## 2020-06-23 ENCOUNTER — Ambulatory Visit (INDEPENDENT_AMBULATORY_CARE_PROVIDER_SITE_OTHER): Payer: Medicaid Other

## 2020-06-23 ENCOUNTER — Other Ambulatory Visit: Payer: Self-pay

## 2020-06-23 ENCOUNTER — Ambulatory Visit (INDEPENDENT_AMBULATORY_CARE_PROVIDER_SITE_OTHER): Payer: Medicaid Other | Admitting: Internal Medicine

## 2020-06-23 ENCOUNTER — Encounter: Payer: Self-pay | Admitting: Internal Medicine

## 2020-06-23 VITALS — BP 114/68 | HR 94 | Temp 98.4°F | Ht 62.0 in | Wt 76.0 lb

## 2020-06-23 DIAGNOSIS — J441 Chronic obstructive pulmonary disease with (acute) exacerbation: Secondary | ICD-10-CM

## 2020-06-23 DIAGNOSIS — J449 Chronic obstructive pulmonary disease, unspecified: Secondary | ICD-10-CM | POA: Diagnosis not present

## 2020-06-23 DIAGNOSIS — Z720 Tobacco use: Secondary | ICD-10-CM | POA: Diagnosis not present

## 2020-06-23 DIAGNOSIS — E119 Type 2 diabetes mellitus without complications: Secondary | ICD-10-CM | POA: Diagnosis not present

## 2020-06-23 DIAGNOSIS — Z23 Encounter for immunization: Secondary | ICD-10-CM

## 2020-06-23 DIAGNOSIS — J90 Pleural effusion, not elsewhere classified: Secondary | ICD-10-CM | POA: Diagnosis not present

## 2020-06-23 DIAGNOSIS — J9611 Chronic respiratory failure with hypoxia: Secondary | ICD-10-CM | POA: Diagnosis not present

## 2020-06-23 DIAGNOSIS — J9811 Atelectasis: Secondary | ICD-10-CM | POA: Diagnosis not present

## 2020-06-23 MED ORDER — METHYLPREDNISOLONE ACETATE 80 MG/ML IJ SUSP
80.0000 mg | Freq: Once | INTRAMUSCULAR | Status: AC
  Administered 2020-06-23: 80 mg via INTRAMUSCULAR

## 2020-06-23 MED ORDER — DOXYCYCLINE HYCLATE 100 MG PO TABS
100.0000 mg | ORAL_TABLET | Freq: Two times a day (BID) | ORAL | 2 refills | Status: DC
Start: 2020-06-23 — End: 2020-07-19

## 2020-06-23 NOTE — Assessment & Plan Note (Signed)
She has not been able to stay off cigs. I think the household are smokers.

## 2020-06-23 NOTE — Assessment & Plan Note (Signed)
Oxygen dependent 24/7  Plan- continue 2-3 L

## 2020-06-23 NOTE — Patient Instructions (Signed)
Order- flu vax standard  Order - lab CBC w diff, CMET     Dx COPD exacerbation  Order- CXR   Dx COPD exacerbation  Script sent for doxycycline antibiotic  Order- depo 80   Dx COPD exacerbation

## 2020-06-23 NOTE — Assessment & Plan Note (Signed)
Exacerbation. May have acute bronchitis now, or early pneumonia. Overall trend is downhill for this intractable smoker.  Plan- continue meds. Doxycycline, CXR and labs. Flu vax

## 2020-06-23 NOTE — Assessment & Plan Note (Signed)
Steroids for help with end-stage COPD will likely aggravate blood sugar as discussed.

## 2020-06-24 ENCOUNTER — Telehealth: Payer: Self-pay | Admitting: *Deleted

## 2020-06-24 LAB — COMPREHENSIVE METABOLIC PANEL WITH GFR
ALT: 19 U/L (ref 0–35)
AST: 17 U/L (ref 0–37)
Albumin: 4.2 g/dL (ref 3.5–5.2)
Alkaline Phosphatase: 70 U/L (ref 39–117)
BUN: 16 mg/dL (ref 6–23)
CO2: 38 meq/L — ABNORMAL HIGH (ref 19–32)
Calcium: 9.5 mg/dL (ref 8.4–10.5)
Chloride: 87 meq/L — ABNORMAL LOW (ref 96–112)
Creatinine, Ser: 0.96 mg/dL (ref 0.40–1.20)
GFR: 63.94 mL/min
Glucose, Bld: 116 mg/dL — ABNORMAL HIGH (ref 70–99)
Potassium: 4.3 meq/L (ref 3.5–5.1)
Sodium: 131 meq/L — ABNORMAL LOW (ref 135–145)
Total Bilirubin: 0.6 mg/dL (ref 0.2–1.2)
Total Protein: 7.1 g/dL (ref 6.0–8.3)

## 2020-06-24 LAB — CBC WITH DIFFERENTIAL/PLATELET
Basophils Absolute: 0.1 10*3/uL (ref 0.0–0.1)
Basophils Relative: 0.6 % (ref 0.0–3.0)
Eosinophils Absolute: 0 10*3/uL (ref 0.0–0.7)
Eosinophils Relative: 0.1 % (ref 0.0–5.0)
HCT: 44.3 % (ref 36.0–46.0)
Hemoglobin: 14.4 g/dL (ref 12.0–15.0)
Lymphocytes Relative: 6.1 % — ABNORMAL LOW (ref 12.0–46.0)
Lymphs Abs: 1.1 10*3/uL (ref 0.7–4.0)
MCHC: 32.5 g/dL (ref 30.0–36.0)
MCV: 89.2 fl (ref 78.0–100.0)
Monocytes Absolute: 1.2 10*3/uL — ABNORMAL HIGH (ref 0.1–1.0)
Monocytes Relative: 6.7 % (ref 3.0–12.0)
Neutro Abs: 15.9 10*3/uL — ABNORMAL HIGH (ref 1.4–7.7)
Neutrophils Relative %: 86.5 % — ABNORMAL HIGH (ref 43.0–77.0)
Platelets: 386 10*3/uL (ref 150.0–400.0)
RBC: 4.96 Mil/uL (ref 3.87–5.11)
RDW: 13.4 % (ref 11.5–15.5)
WBC: 18.3 10*3/uL (ref 4.0–10.5)

## 2020-06-24 NOTE — Progress Notes (Signed)
Spoke with Megan Soto and notified of results per Dr. Young Megan Soto verbalized understanding and denied any questions. 

## 2020-06-24 NOTE — Telephone Encounter (Signed)
Noted  

## 2020-06-24 NOTE — Telephone Encounter (Signed)
Received a call from Clydie Braun in the Lab on critical results on this pt.  Pt was seen on 11/15 by CY.    WBC is 18.3  Will forward this message to CY for further recs.

## 2020-06-24 NOTE — Progress Notes (Signed)
Spoke with pt and notified of results per Dr. Wert. Pt verbalized understanding and denied any questions. 

## 2020-06-25 ENCOUNTER — Other Ambulatory Visit: Payer: Self-pay | Admitting: Internal Medicine

## 2020-06-27 ENCOUNTER — Telehealth: Payer: Self-pay | Admitting: Internal Medicine

## 2020-06-27 MED ORDER — AMOXICILLIN 500 MG PO TABS: 500.0000 mg | ORAL_TABLET | Freq: Two times a day (BID) | ORAL | 0 refills | Status: DC

## 2020-06-27 NOTE — Telephone Encounter (Signed)
Offer amoxacillin 500 mg, # 14, 1 twice daily 

## 2020-06-27 NOTE — Telephone Encounter (Signed)
Primary Pulmonologist: Young Last office visit and with whom: 06/23/2020 Young What do we see them for (pulmonary problems): COPD Last OV assessment/plan:  Assessment & Plan Note by Waymon Budge, MD at 06/23/2020 3:49 PM Author: Waymon Budge, MD Author Type: Physician Filed: 06/23/2020 3:49 PM  Note Status: Written Cosign: Cosign Not Required Encounter Date: 06/23/2020  Problem: Tobacco abuse  Editor: Waymon Budge, MD (Physician)                 She has not been able to stay off cigs. I think the household are smokers.    Assessment & Plan Note by Waymon Budge, MD at 06/23/2020 3:48 PM Author: Waymon Budge, MD Author Type: Physician Filed: 06/23/2020 3:48 PM  Note Status: Written Cosign: Cosign Not Required Encounter Date: 06/23/2020  Problem: Type 2 diabetes mellitus without complication, without long-term current use of insulin (HCC)  Editor: Waymon Budge, MD (Physician)                 Steroids for help with end-stage COPD will likely aggravate blood sugar as discussed.    Assessment & Plan Note by Waymon Budge, MD at 06/23/2020 3:45 PM Author: Waymon Budge, MD Author Type: Physician Filed: 06/23/2020 3:47 PM  Note Status: Written Cosign: Cosign Not Required Encounter Date: 06/23/2020  Problem: COPD mixed type [facility])  Editor: Waymon Budge, MD (Physician)                 Exacerbation. May have acute bronchitis now, or early pneumonia. Overall trend is downhill for this intractable smoker.  Plan- continue meds. Doxycycline, CXR and labs. Flu vax    Assessment & Plan Note by Waymon Budge, MD at 06/23/2020 3:44 PM Author: Waymon Budge, MD Author Type: Physician Filed: 06/23/2020 3:45 PM  Note Status: Written Cosign: Cosign Not Required Encounter Date: 06/23/2020  Problem: Chronic respiratory failure with hypoxia (HCC)  Editor: Waymon Budge, MD (Physician)                 Oxygen dependent 24/7  Plan- continue 2-3 L     Patient Instructions by Waymon Budge, MD at 06/23/2020 2:00 PM Author: Waymon Budge, MD Author Type: Physician Filed: 06/23/2020 3:21 PM  Note Status: Signed Cosign: Cosign Not Required Encounter Date: 06/23/2020  Editor: Waymon Budge, MD (Physician)                 Order- flu vax standard  Order - lab CBC w diff, CMET     Dx COPD exacerbation  Order- CXR   Dx COPD exacerbation  Script sent for doxycycline antibiotic  Order- depo 80   Dx COPD exacerbation      Instructions    Return in about 3 months (around 09/23/2020). Order- flu vax standard  Order - lab CBC w diff, CMET     Dx COPD exacerbation  Order- CXR   Dx COPD exacerbation  Script sent for doxycycline antibiotic  Order- depo 80   Dx COPD exacerbation       Reason for call:  Medication reaction?  Itching all over. Started yesterday on her hands.  Some places have hives and some places are just red and itchy. Started after she began taking Doxycycline Tuesday pm.  Denies any swelling.  SOB is at baseline.  Dr. Maple Hudson, please advise.  Thank you.  (examples of things to ask: : When did symptoms start? Fever? Cough? Productive? Color  to sputum? More sputum than usual? Wheezing? Have you needed increased oxygen? Are you taking your respiratory medications? What over the counter measures have you tried?)  Allergies  Allergen Reactions   Aspirin Nausea And Vomiting    Immunization History  Administered Date(s) Administered   Hepb-cpg 08/16/2018, 09/18/2018   Influenza Split 04/27/2011, 05/09/2012, 05/28/2015, 05/09/2016   Influenza Whole 08/28/2009, 04/27/2010, 04/10/2019   Influenza,inj,Quad PF,6+ Mos 04/25/2013, 05/16/2017, 05/25/2018, 06/23/2020   Pneumococcal Polysaccharide-23 10/07/2011, 06/27/2018   Tdap 07/15/2011

## 2020-06-27 NOTE — Telephone Encounter (Signed)
Rx was sent to the pharmacy and pt made aware

## 2020-06-27 NOTE — Telephone Encounter (Signed)
Spoke with patient. She verbalized understanding of CY's recommendations. She is currently taking 10mg  of prednisone now and wants to double it to 20mg  since she will not be taking an antibiotic. I explained to her that CY wanted her to remain off the antibiotics to see if the reaction will go away. She is insisting on doubling the prednisone or getting a RX for amoxicillin.   CY, can you please advise? Thanks!

## 2020-06-27 NOTE — Telephone Encounter (Signed)
Stop the doxycycline antibiotic  Ok to continue the prednisone  We will wait and see how she does before we consider starting a different antibiotic.

## 2020-06-28 ENCOUNTER — Other Ambulatory Visit: Payer: Self-pay | Admitting: Family Medicine

## 2020-07-09 ENCOUNTER — Other Ambulatory Visit: Payer: Self-pay | Admitting: Internal Medicine

## 2020-07-11 DIAGNOSIS — Z79899 Other long term (current) drug therapy: Secondary | ICD-10-CM | POA: Diagnosis not present

## 2020-07-11 DIAGNOSIS — M545 Low back pain, unspecified: Secondary | ICD-10-CM | POA: Diagnosis not present

## 2020-07-11 DIAGNOSIS — G8929 Other chronic pain: Secondary | ICD-10-CM | POA: Diagnosis not present

## 2020-07-15 ENCOUNTER — Inpatient Hospital Stay (HOSPITAL_COMMUNITY)
Admission: EM | Admit: 2020-07-15 | Discharge: 2020-07-19 | DRG: 190 | Disposition: A | Discharge status: Hospice | Payer: Medicaid Other | Attending: Internal Medicine | Admitting: Internal Medicine

## 2020-07-15 ENCOUNTER — Encounter (HOSPITAL_COMMUNITY): Payer: Self-pay

## 2020-07-15 ENCOUNTER — Emergency Department (HOSPITAL_COMMUNITY): Payer: Medicaid Other

## 2020-07-15 ENCOUNTER — Other Ambulatory Visit: Payer: Self-pay

## 2020-07-15 DIAGNOSIS — R627 Adult failure to thrive: Secondary | ICD-10-CM | POA: Diagnosis not present

## 2020-07-15 DIAGNOSIS — R0602 Shortness of breath: Secondary | ICD-10-CM

## 2020-07-15 DIAGNOSIS — Z515 Encounter for palliative care: Secondary | ICD-10-CM

## 2020-07-15 DIAGNOSIS — R0902 Hypoxemia: Secondary | ICD-10-CM

## 2020-07-15 DIAGNOSIS — T380X5A Adverse effect of glucocorticoids and synthetic analogues, initial encounter: Secondary | ICD-10-CM | POA: Diagnosis not present

## 2020-07-15 DIAGNOSIS — Z20822 Contact with and (suspected) exposure to covid-19: Secondary | ICD-10-CM | POA: Diagnosis not present

## 2020-07-15 DIAGNOSIS — D649 Anemia, unspecified: Secondary | ICD-10-CM | POA: Diagnosis present

## 2020-07-15 DIAGNOSIS — K219 Gastro-esophageal reflux disease without esophagitis: Secondary | ICD-10-CM | POA: Diagnosis present

## 2020-07-15 DIAGNOSIS — J9811 Atelectasis: Secondary | ICD-10-CM | POA: Diagnosis present

## 2020-07-15 DIAGNOSIS — D72829 Elevated white blood cell count, unspecified: Secondary | ICD-10-CM | POA: Diagnosis not present

## 2020-07-15 DIAGNOSIS — Z66 Do not resuscitate: Secondary | ICD-10-CM | POA: Diagnosis not present

## 2020-07-15 DIAGNOSIS — R64 Cachexia: Secondary | ICD-10-CM | POA: Diagnosis present

## 2020-07-15 DIAGNOSIS — K7460 Unspecified cirrhosis of liver: Secondary | ICD-10-CM | POA: Diagnosis present

## 2020-07-15 DIAGNOSIS — Z8619 Personal history of other infectious and parasitic diseases: Secondary | ICD-10-CM

## 2020-07-15 DIAGNOSIS — J9622 Acute and chronic respiratory failure with hypercapnia: Secondary | ICD-10-CM | POA: Diagnosis present

## 2020-07-15 DIAGNOSIS — R52 Pain, unspecified: Secondary | ICD-10-CM | POA: Diagnosis not present

## 2020-07-15 DIAGNOSIS — E43 Unspecified severe protein-calorie malnutrition: Secondary | ICD-10-CM | POA: Diagnosis not present

## 2020-07-15 DIAGNOSIS — M797 Fibromyalgia: Secondary | ICD-10-CM | POA: Diagnosis present

## 2020-07-15 DIAGNOSIS — J9601 Acute respiratory failure with hypoxia: Secondary | ICD-10-CM | POA: Diagnosis not present

## 2020-07-15 DIAGNOSIS — I499 Cardiac arrhythmia, unspecified: Secondary | ICD-10-CM | POA: Diagnosis not present

## 2020-07-15 DIAGNOSIS — Z681 Body mass index (BMI) 19 or less, adult: Secondary | ICD-10-CM | POA: Diagnosis not present

## 2020-07-15 DIAGNOSIS — F32A Depression, unspecified: Secondary | ICD-10-CM | POA: Diagnosis present

## 2020-07-15 DIAGNOSIS — R739 Hyperglycemia, unspecified: Secondary | ICD-10-CM | POA: Diagnosis not present

## 2020-07-15 DIAGNOSIS — Z7189 Other specified counseling: Secondary | ICD-10-CM

## 2020-07-15 DIAGNOSIS — F419 Anxiety disorder, unspecified: Secondary | ICD-10-CM | POA: Diagnosis present

## 2020-07-15 DIAGNOSIS — J9602 Acute respiratory failure with hypercapnia: Secondary | ICD-10-CM | POA: Diagnosis not present

## 2020-07-15 DIAGNOSIS — Z9981 Dependence on supplemental oxygen: Secondary | ICD-10-CM | POA: Diagnosis not present

## 2020-07-15 DIAGNOSIS — R6889 Other general symptoms and signs: Secondary | ICD-10-CM | POA: Diagnosis not present

## 2020-07-15 DIAGNOSIS — J441 Chronic obstructive pulmonary disease with (acute) exacerbation: Principal | ICD-10-CM | POA: Diagnosis present

## 2020-07-15 DIAGNOSIS — I10 Essential (primary) hypertension: Secondary | ICD-10-CM | POA: Diagnosis present

## 2020-07-15 DIAGNOSIS — E1165 Type 2 diabetes mellitus with hyperglycemia: Secondary | ICD-10-CM | POA: Diagnosis present

## 2020-07-15 DIAGNOSIS — Z801 Family history of malignant neoplasm of trachea, bronchus and lung: Secondary | ICD-10-CM

## 2020-07-15 DIAGNOSIS — G8929 Other chronic pain: Secondary | ICD-10-CM | POA: Diagnosis present

## 2020-07-15 DIAGNOSIS — J9621 Acute and chronic respiratory failure with hypoxia: Secondary | ICD-10-CM | POA: Diagnosis not present

## 2020-07-15 DIAGNOSIS — Z803 Family history of malignant neoplasm of breast: Secondary | ICD-10-CM

## 2020-07-15 DIAGNOSIS — Z743 Need for continuous supervision: Secondary | ICD-10-CM | POA: Diagnosis not present

## 2020-07-15 DIAGNOSIS — I471 Supraventricular tachycardia: Secondary | ICD-10-CM | POA: Diagnosis not present

## 2020-07-15 DIAGNOSIS — E119 Type 2 diabetes mellitus without complications: Secondary | ICD-10-CM | POA: Diagnosis not present

## 2020-07-15 DIAGNOSIS — E871 Hypo-osmolality and hyponatremia: Secondary | ICD-10-CM | POA: Diagnosis present

## 2020-07-15 DIAGNOSIS — R54 Age-related physical debility: Secondary | ICD-10-CM | POA: Diagnosis present

## 2020-07-15 DIAGNOSIS — Z79899 Other long term (current) drug therapy: Secondary | ICD-10-CM

## 2020-07-15 DIAGNOSIS — Z7951 Long term (current) use of inhaled steroids: Secondary | ICD-10-CM

## 2020-07-15 DIAGNOSIS — Z825 Family history of asthma and other chronic lower respiratory diseases: Secondary | ICD-10-CM

## 2020-07-15 DIAGNOSIS — R0603 Acute respiratory distress: Secondary | ICD-10-CM

## 2020-07-15 DIAGNOSIS — Z7952 Long term (current) use of systemic steroids: Secondary | ICD-10-CM

## 2020-07-15 DIAGNOSIS — E875 Hyperkalemia: Secondary | ICD-10-CM | POA: Diagnosis not present

## 2020-07-15 DIAGNOSIS — R0689 Other abnormalities of breathing: Secondary | ICD-10-CM | POA: Diagnosis not present

## 2020-07-15 DIAGNOSIS — Z800 Family history of malignant neoplasm of digestive organs: Secondary | ICD-10-CM

## 2020-07-15 DIAGNOSIS — F17210 Nicotine dependence, cigarettes, uncomplicated: Secondary | ICD-10-CM | POA: Diagnosis present

## 2020-07-15 DIAGNOSIS — R531 Weakness: Secondary | ICD-10-CM | POA: Diagnosis not present

## 2020-07-15 DIAGNOSIS — Z7984 Long term (current) use of oral hypoglycemic drugs: Secondary | ICD-10-CM

## 2020-07-15 LAB — I-STAT CHEM 8, ED
BUN: 5 mg/dL — ABNORMAL LOW (ref 8–23)
Calcium, Ion: 0.77 mmol/L — CL (ref 1.15–1.40)
Chloride: 108 mmol/L (ref 98–111)
Creatinine, Ser: 0.3 mg/dL — ABNORMAL LOW (ref 0.44–1.00)
Glucose, Bld: 78 mg/dL (ref 70–99)
HCT: 19 % — ABNORMAL LOW (ref 36.0–46.0)
Hemoglobin: 6.5 g/dL — CL (ref 12.0–15.0)
Potassium: 2.1 mmol/L — CL (ref 3.5–5.1)
Sodium: 141 mmol/L (ref 135–145)
TCO2: 20 mmol/L — ABNORMAL LOW (ref 22–32)

## 2020-07-15 LAB — COMPREHENSIVE METABOLIC PANEL
ALT: 30 U/L (ref 0–44)
AST: 27 U/L (ref 15–41)
Albumin: 3.2 g/dL — ABNORMAL LOW (ref 3.5–5.0)
Alkaline Phosphatase: 51 U/L (ref 38–126)
Anion gap: 9 (ref 5–15)
BUN: 10 mg/dL (ref 8–23)
CO2: 29 mmol/L (ref 22–32)
Calcium: 7.4 mg/dL — ABNORMAL LOW (ref 8.9–10.3)
Chloride: 93 mmol/L — ABNORMAL LOW (ref 98–111)
Creatinine, Ser: 0.74 mg/dL (ref 0.44–1.00)
GFR, Estimated: >60 mL/min (ref >60)
Glucose, Bld: 128 mg/dL — ABNORMAL HIGH (ref 70–99)
Potassium: 3.7 mmol/L (ref 3.5–5.1)
Sodium: 131 mmol/L — ABNORMAL LOW (ref 135–145)
Total Bilirubin: 0.7 mg/dL (ref 0.3–1.2)
Total Protein: 5.7 g/dL — ABNORMAL LOW (ref 6.5–8.1)

## 2020-07-15 LAB — BLOOD GAS, VENOUS
Acid-Base Excess: 1.6 mmol/L (ref 0.0–2.0)
Acid-Base Excess: 3.9 mmol/L — ABNORMAL HIGH (ref 0.0–2.0)
Acid-Base Excess: 1.6 mmol/L (ref 0.0–2.0)
Bicarbonate: 32.9 mmol/L — ABNORMAL HIGH (ref 20.0–28.0)
Bicarbonate: 33.5 mmol/L — ABNORMAL HIGH (ref 20.0–28.0)
Bicarbonate: 30.9 mmol/L — ABNORMAL HIGH (ref 20.0–28.0)
FIO2: 21
O2 Saturation: 75.6 %
O2 Saturation: 79.7 %
O2 Saturation: 61.1 %
Patient temperature: 98.6
Patient temperature: 98.6
Patient temperature: 98.6
pCO2, Ven: 98.7 mmHg (ref 44.0–60.0)
pCO2, Ven: 86.3 mmHg (ref 44.0–60.0)
pCO2, Ven: 78.7 mmHg (ref 44.0–60.0)
pH, Ven: 7.149 — CL (ref 7.250–7.430)
pH, Ven: 7.214 — ABNORMAL LOW (ref 7.250–7.430)
pH, Ven: 7.218 — ABNORMAL LOW (ref 7.250–7.430)
pO2, Ven: 52.9 mmHg — ABNORMAL HIGH (ref 32.0–45.0)
pO2, Ven: 54 mmHg — ABNORMAL HIGH (ref 32.0–45.0)
pO2, Ven: 38.5 mmHg (ref 32.0–45.0)

## 2020-07-15 LAB — URINALYSIS, ROUTINE W REFLEX MICROSCOPIC
Bilirubin Urine: NEGATIVE
Glucose, UA: NEGATIVE mg/dL
Hgb urine dipstick: NEGATIVE
Ketones, ur: NEGATIVE mg/dL
Leukocytes,Ua: NEGATIVE
Nitrite: NEGATIVE
Protein, ur: NEGATIVE mg/dL
Specific Gravity, Urine: 1.016 (ref 1.005–1.030)
pH: 5 (ref 5.0–8.0)

## 2020-07-15 LAB — CBC WITH DIFFERENTIAL/PLATELET
Abs Immature Granulocytes: 0.05 10*3/uL (ref 0.00–0.07)
Basophils Absolute: 0 10*3/uL (ref 0.0–0.1)
Basophils Relative: 0 %
Eosinophils Absolute: 0 10*3/uL (ref 0.0–0.5)
Eosinophils Relative: 0 %
HCT: 32.9 % — ABNORMAL LOW (ref 36.0–46.0)
Hemoglobin: 10.5 g/dL — ABNORMAL LOW (ref 12.0–15.0)
Immature Granulocytes: 0 %
Lymphocytes Relative: 6 %
Lymphs Abs: 0.7 10*3/uL (ref 0.7–4.0)
MCH: 29.7 pg (ref 26.0–34.0)
MCHC: 31.9 g/dL (ref 30.0–36.0)
MCV: 92.9 fL (ref 80.0–100.0)
Monocytes Absolute: 1 10*3/uL (ref 0.1–1.0)
Monocytes Relative: 8 %
Neutro Abs: 10.3 10*3/uL — ABNORMAL HIGH (ref 1.7–7.7)
Neutrophils Relative %: 86 %
Platelets: 267 10*3/uL (ref 150–400)
RBC: 3.54 MIL/uL — ABNORMAL LOW (ref 3.87–5.11)
RDW: 12.5 % (ref 11.5–15.5)
WBC: 12 10*3/uL — ABNORMAL HIGH (ref 4.0–10.5)
nRBC: 0 % (ref 0.0–0.2)

## 2020-07-15 LAB — RESP PANEL BY RT-PCR (FLU A&B, COVID) ARPGX2
Influenza A by PCR: NEGATIVE
Influenza B by PCR: NEGATIVE
SARS Coronavirus 2 by RT PCR: NEGATIVE

## 2020-07-15 LAB — CBC
HCT: 35.8 % — ABNORMAL LOW (ref 36.0–46.0)
Hemoglobin: 11.5 g/dL — ABNORMAL LOW (ref 12.0–15.0)
MCH: 29.9 pg (ref 26.0–34.0)
MCHC: 32.1 g/dL (ref 30.0–36.0)
MCV: 93.2 fL (ref 80.0–100.0)
Platelets: 241 10*3/uL (ref 150–400)
RBC: 3.84 MIL/uL — ABNORMAL LOW (ref 3.87–5.11)
RDW: 12.6 % (ref 11.5–15.5)
WBC: 10 10*3/uL (ref 4.0–10.5)
nRBC: 0 % (ref 0.0–0.2)

## 2020-07-15 LAB — GLUCOSE, CAPILLARY
Glucose-Capillary: 102 mg/dL — ABNORMAL HIGH (ref 70–99)
Glucose-Capillary: 117 mg/dL — ABNORMAL HIGH (ref 70–99)

## 2020-07-15 LAB — BLOOD GAS, ARTERIAL
Acid-Base Excess: 0.8 mmol/L (ref 0.0–2.0)
Bicarbonate: 30.5 mmol/L — ABNORMAL HIGH (ref 20.0–28.0)
O2 Saturation: 88.3 %
Patient temperature: 98.6
pCO2 arterial: 81.4 mmHg (ref 32.0–48.0)
pH, Arterial: 7.199 — CL (ref 7.350–7.450)
pO2, Arterial: 69.3 mmHg — ABNORMAL LOW (ref 83.0–108.0)

## 2020-07-15 LAB — MRSA PCR SCREENING: MRSA by PCR: POSITIVE — AB

## 2020-07-15 LAB — PROCALCITONIN: Procalcitonin: <0.1 ng/mL

## 2020-07-15 LAB — CBG MONITORING, ED: Glucose-Capillary: 177 mg/dL — ABNORMAL HIGH (ref 70–99)

## 2020-07-15 MED ORDER — CHLORHEXIDINE GLUCONATE CLOTH 2 % EX PADS
6.0000 | MEDICATED_PAD | Freq: Every day | CUTANEOUS | Status: DC
  Administered 2020-07-15 – 2020-07-18 (×4): 6 via TOPICAL

## 2020-07-15 MED ORDER — ENOXAPARIN SODIUM 30 MG/0.3ML ~~LOC~~ SOLN
30.0000 mg | SUBCUTANEOUS | Status: DC
  Administered 2020-07-15: 30 mg via SUBCUTANEOUS
  Filled 2020-07-15: qty 0.3

## 2020-07-15 MED ORDER — SODIUM CHLORIDE 0.9 % IV SOLN
1.0000 g | INTRAVENOUS | Status: DC
  Administered 2020-07-15: 1 g via INTRAVENOUS
  Filled 2020-07-15: qty 10

## 2020-07-15 MED ORDER — POTASSIUM CHLORIDE 10 MEQ/100ML IV SOLN
10.0000 meq | INTRAVENOUS | Status: AC
  Administered 2020-07-15 (×4): 10 meq via INTRAVENOUS
  Filled 2020-07-15 (×4): qty 100

## 2020-07-15 MED ORDER — SODIUM CHLORIDE 0.9 % IV BOLUS
1000.0000 mL | Freq: Once | INTRAVENOUS | Status: AC
  Administered 2020-07-15: 1000 mL via INTRAVENOUS

## 2020-07-15 MED ORDER — METHYLPREDNISOLONE SODIUM SUCC 125 MG IJ SOLR
60.0000 mg | INTRAMUSCULAR | Status: DC
  Administered 2020-07-15: 60 mg via INTRAVENOUS
  Filled 2020-07-15: qty 2

## 2020-07-15 MED ORDER — INSULIN ASPART 100 UNIT/ML ~~LOC~~ SOLN
1.0000 [IU] | SUBCUTANEOUS | Status: DC
  Administered 2020-07-15 – 2020-07-16 (×2): 2 [IU] via SUBCUTANEOUS
  Administered 2020-07-16 (×3): 1 [IU] via SUBCUTANEOUS
  Administered 2020-07-17: 2 [IU] via SUBCUTANEOUS
  Administered 2020-07-17: 3 [IU] via SUBCUTANEOUS
  Administered 2020-07-17: 2 [IU] via SUBCUTANEOUS
  Administered 2020-07-17: 3 [IU] via SUBCUTANEOUS
  Administered 2020-07-18: 1 [IU] via SUBCUTANEOUS
  Administered 2020-07-18: 2 [IU] via SUBCUTANEOUS
  Administered 2020-07-18: 1 [IU] via SUBCUTANEOUS
  Administered 2020-07-18: 2 [IU] via SUBCUTANEOUS
  Filled 2020-07-15: qty 0.03

## 2020-07-15 MED ORDER — POLYETHYLENE GLYCOL 3350 17 G PO PACK: 17.0000 g | PACK | Freq: Every day | ORAL | Status: DC | PRN

## 2020-07-15 MED ORDER — AZITHROMYCIN 250 MG PO TABS
250.0000 mg | ORAL_TABLET | Freq: Every day | ORAL | Status: AC
  Administered 2020-07-16 – 2020-07-19 (×4): 250 mg via ORAL
  Filled 2020-07-15 (×4): qty 1

## 2020-07-15 MED ORDER — ACETAMINOPHEN 10 MG/ML IV SOLN: 1000.0000 mg | Freq: Four times a day (QID) | INTRAVENOUS | Status: AC | PRN

## 2020-07-15 MED ORDER — IPRATROPIUM-ALBUTEROL 0.5-2.5 (3) MG/3ML IN SOLN
3.0000 mL | RESPIRATORY_TRACT | Status: DC
  Administered 2020-07-15 – 2020-07-16 (×4): 3 mL via RESPIRATORY_TRACT
  Filled 2020-07-15 (×4): qty 3

## 2020-07-15 MED ORDER — ONDANSETRON HCL 4 MG/2ML IJ SOLN: 4.0000 mg | Freq: Four times a day (QID) | INTRAMUSCULAR | Status: DC | PRN

## 2020-07-15 MED ORDER — SODIUM CHLORIDE 0.9 % IV SOLN
500.0000 mg | Freq: Once | INTRAVENOUS | Status: AC
  Administered 2020-07-15: 500 mg via INTRAVENOUS
  Filled 2020-07-15: qty 500

## 2020-07-15 MED ORDER — NICOTINE 21 MG/24HR TD PT24
21.0000 mg | MEDICATED_PATCH | TRANSDERMAL | Status: DC
  Administered 2020-07-15 – 2020-07-18 (×4): 21 mg via TRANSDERMAL
  Filled 2020-07-15 (×4): qty 1

## 2020-07-15 MED ORDER — DOCUSATE SODIUM 100 MG PO CAPS: 100.0000 mg | ORAL_CAPSULE | Freq: Two times a day (BID) | ORAL | Status: DC | PRN

## 2020-07-15 MED ORDER — ORAL CARE MOUTH RINSE
15.0000 mL | Freq: Two times a day (BID) | OROMUCOSAL | Status: DC
  Administered 2020-07-15 – 2020-07-18 (×6): 15 mL via OROMUCOSAL

## 2020-07-15 MED ORDER — DEXMEDETOMIDINE HCL IN NACL 200 MCG/50ML IV SOLN
0.1000 ug/kg/h | INTRAVENOUS | Status: DC
  Administered 2020-07-15 – 2020-07-16 (×2): 0.4 ug/kg/h via INTRAVENOUS
  Filled 2020-07-15 (×2): qty 50

## 2020-07-15 MED ORDER — MUPIROCIN 2 % EX OINT
1.0000 "application " | TOPICAL_OINTMENT | Freq: Two times a day (BID) | CUTANEOUS | Status: DC
  Administered 2020-07-15 – 2020-07-19 (×8): 1 via NASAL
  Filled 2020-07-15 (×3): qty 22

## 2020-07-15 NOTE — ED Notes (Signed)
Report given to shay rn

## 2020-07-15 NOTE — Plan of Care (Signed)
  Problem: Respiratory: Goal: Levels of oxygenation will improve Outcome: Progressing   

## 2020-07-15 NOTE — Progress Notes (Signed)
eLink Physician-Brief Progress Note Patient Name: Megan Soto DOB: [birth year] MRN: [id number]   Date of Service  07/15/2020  HPI/Events of Note  Patient with chronic hypercapnic respiratory failure secondary to smoking induced COPD, she is asking for something for pain.  eICU Interventions  PRN iv Tylenol ordered.        Thomasene Lot Kaycee Haycraft 07/15/2020, 9:01 PM

## 2020-07-15 NOTE — ED Notes (Signed)
Farrel Gordon, PA aware pts pH 7.149 and pCO2 98.7.

## 2020-07-15 NOTE — Progress Notes (Signed)
Notified Lab that ABG being sent for analysis. 
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
## 2020-07-15 NOTE — Progress Notes (Signed)
eLink Physician-Brief Progress Note Patient Name: Megan Soto DOB: September 30, 1960 MRN: [id number]   Date of Service  07/15/2020  HPI/Events of Note  Patient is extremely fidgety and anxious on BIPAP for acute on chronic respiratory failure.  eICU Interventions  Will order Precedex infusion for moderate sedation and anxiolysis.        Thomasene Lot Everli Rother 07/15/2020, 10:01 PM

## 2020-07-15 NOTE — ED Triage Notes (Signed)
EMS reports from home, Hx of COPD, Asthma, Chronic Bronchitis, SOB x several weeks.   BP 160/70 HR 88 RR 18 Sp02 98 on 10ltrs  15mg  Albuterol 1mg  Atrovent 125mg  Solumedrol 2gms Mag  Enroute  18 LAC
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
## 2020-07-15 NOTE — ED Notes (Signed)
Date and time results received: 07/15/20 1755 (use smartphrase ".now" to insert current time)  Test: PH Art 7.199 / pCO2 81.4 Critical Value: 7.199 / 81.4  Name of Provider Notified: Shalyn PA  Orders Received? Or Actions Taken?: Provider notified

## 2020-07-16 DIAGNOSIS — E875 Hyperkalemia: Secondary | ICD-10-CM

## 2020-07-16 DIAGNOSIS — Z515 Encounter for palliative care: Secondary | ICD-10-CM

## 2020-07-16 DIAGNOSIS — R0602 Shortness of breath: Secondary | ICD-10-CM

## 2020-07-16 DIAGNOSIS — R531 Weakness: Secondary | ICD-10-CM

## 2020-07-16 DIAGNOSIS — D649 Anemia, unspecified: Secondary | ICD-10-CM

## 2020-07-16 DIAGNOSIS — R739 Hyperglycemia, unspecified: Secondary | ICD-10-CM

## 2020-07-16 LAB — CBC
HCT: 32.5 % — ABNORMAL LOW (ref 36.0–46.0)
Hemoglobin: 10.4 g/dL — ABNORMAL LOW (ref 12.0–15.0)
MCH: 30.1 pg (ref 26.0–34.0)
MCHC: 32 g/dL (ref 30.0–36.0)
MCV: 93.9 fL (ref 80.0–100.0)
Platelets: 185 10*3/uL (ref 150–400)
RBC: 3.46 MIL/uL — ABNORMAL LOW (ref 3.87–5.11)
RDW: 12.6 % (ref 11.5–15.5)
WBC: 6 10*3/uL (ref 4.0–10.5)
nRBC: 0 % (ref 0.0–0.2)

## 2020-07-16 LAB — BASIC METABOLIC PANEL
Anion gap: 8 (ref 5–15)
Anion gap: 7 (ref 5–15)
BUN: 15 mg/dL (ref 8–23)
BUN: 17 mg/dL (ref 8–23)
CO2: 27 mmol/L (ref 22–32)
CO2: 29 mmol/L (ref 22–32)
Calcium: 8.1 mg/dL — ABNORMAL LOW (ref 8.9–10.3)
Calcium: 8.3 mg/dL — ABNORMAL LOW (ref 8.9–10.3)
Chloride: 93 mmol/L — ABNORMAL LOW (ref 98–111)
Chloride: 91 mmol/L — ABNORMAL LOW (ref 98–111)
Creatinine, Ser: 0.86 mg/dL (ref 0.44–1.00)
Creatinine, Ser: 0.85 mg/dL (ref 0.44–1.00)
GFR, Estimated: >60 mL/min (ref >60)
GFR, Estimated: >60 mL/min (ref >60)
Glucose, Bld: 119 mg/dL — ABNORMAL HIGH (ref 70–99)
Glucose, Bld: 112 mg/dL — ABNORMAL HIGH (ref 70–99)
Potassium: 5.6 mmol/L — ABNORMAL HIGH (ref 3.5–5.1)
Potassium: 4.9 mmol/L (ref 3.5–5.1)
Sodium: 128 mmol/L — ABNORMAL LOW (ref 135–145)
Sodium: 127 mmol/L — ABNORMAL LOW (ref 135–145)

## 2020-07-16 LAB — GLUCOSE, CAPILLARY
Glucose-Capillary: 140 mg/dL — ABNORMAL HIGH (ref 70–99)
Glucose-Capillary: 99 mg/dL (ref 70–99)
Glucose-Capillary: 148 mg/dL — ABNORMAL HIGH (ref 70–99)
Glucose-Capillary: 156 mg/dL — ABNORMAL HIGH (ref 70–99)

## 2020-07-16 LAB — HIV ANTIBODY (ROUTINE TESTING W REFLEX): HIV Screen 4th Generation wRfx: NONREACTIVE

## 2020-07-16 LAB — BLOOD GAS, VENOUS
Acid-Base Excess: 2.6 mmol/L — ABNORMAL HIGH (ref 0.0–2.0)
Bicarbonate: 29.2 mmol/L — ABNORMAL HIGH (ref 20.0–28.0)
O2 Saturation: 86.3 %
Patient temperature: 98.6
pCO2, Ven: 58.4 mmHg (ref 44.0–60.0)
pH, Ven: 7.319 (ref 7.250–7.430)
pO2, Ven: 55.4 mmHg — ABNORMAL HIGH (ref 32.0–45.0)

## 2020-07-16 LAB — MAGNESIUM: Magnesium: 1.8 mg/dL (ref 1.7–2.4)

## 2020-07-16 LAB — PHOSPHORUS: Phosphorus: 3.2 mg/dL (ref 2.5–4.6)

## 2020-07-16 MED ORDER — OXYCODONE HCL 5 MG PO TABS
10.0000 mg | ORAL_TABLET | Freq: Four times a day (QID) | ORAL | Status: DC | PRN
  Administered 2020-07-16 – 2020-07-18 (×8): 10 mg via ORAL
  Filled 2020-07-16 (×8): qty 2

## 2020-07-16 MED ORDER — IPRATROPIUM-ALBUTEROL 0.5-2.5 (3) MG/3ML IN SOLN
3.0000 mL | RESPIRATORY_TRACT | Status: DC | PRN
  Administered 2020-07-17: 3 mL via RESPIRATORY_TRACT
  Filled 2020-07-16: qty 3

## 2020-07-16 MED ORDER — ALPRAZOLAM 1 MG PO TABS
1.0000 mg | ORAL_TABLET | Freq: Three times a day (TID) | ORAL | Status: DC | PRN
  Administered 2020-07-16 – 2020-07-19 (×11): 1 mg via ORAL
  Filled 2020-07-16 (×12): qty 1

## 2020-07-16 MED ORDER — PANTOPRAZOLE SODIUM 40 MG PO TBEC
40.0000 mg | DELAYED_RELEASE_TABLET | Freq: Every day | ORAL | Status: DC
  Administered 2020-07-16 – 2020-07-19 (×4): 40 mg via ORAL
  Filled 2020-07-16 (×4): qty 1

## 2020-07-16 MED ORDER — PREDNISONE 20 MG PO TABS
40.0000 mg | ORAL_TABLET | Freq: Every day | ORAL | Status: DC
  Administered 2020-07-16 – 2020-07-19 (×4): 40 mg via ORAL
  Filled 2020-07-16 (×4): qty 2

## 2020-07-16 MED ORDER — FLUTICASONE FUROATE-VILANTEROL 100-25 MCG/INH IN AEPB
1.0000 | INHALATION_SPRAY | Freq: Every day | RESPIRATORY_TRACT | Status: DC
  Administered 2020-07-16 – 2020-07-17 (×2): 1 via RESPIRATORY_TRACT
  Filled 2020-07-16: qty 28

## 2020-07-16 MED ORDER — UMECLIDINIUM BROMIDE 62.5 MCG/INH IN AEPB
1.0000 | INHALATION_SPRAY | Freq: Every day | RESPIRATORY_TRACT | Status: DC
  Administered 2020-07-17: 10:00:00 1 via RESPIRATORY_TRACT
  Filled 2020-07-16: qty 7

## 2020-07-16 MED ORDER — ENOXAPARIN SODIUM 30 MG/0.3ML ~~LOC~~ SOLN
30.0000 mg | SUBCUTANEOUS | Status: DC
  Administered 2020-07-16 – 2020-07-19 (×4): 30 mg via SUBCUTANEOUS
  Filled 2020-07-16 (×4): qty 0.3

## 2020-07-16 NOTE — Consult Note (Signed)
Consultation Note Date: 07/16/2020   Patient Name: Megan Soto  DOB: 1961-02-16  MRN: [id number]  Age / Sex: 59 y.o., female  PCP: Jackelyn PolingWelborn, Ryan, DO Referring Physician: Steffanie Dunnlark, Laura P, DO  Reason for Consultation: Establishing goals of care  HPI/Patient Profile: 59 y.o. female  admitted on 07/15/2020    Clinical Assessment and Goals of Care: 59 year old lady with severe chronic obstructive pulmonary disease and chronic hypoxic respiratory failure, she is on 2 L supplemental oxygen via nasal cannula at home.  She has been [facility] [place] because of shortness of breath.  Off-and-on she has been on antibiotics as well as steroids.  Unfortunately, she has continued to smoke up until very recently.  She also has fibromyalgia, diabetes, hypertension and hepatitis C history.  Patient is awake alert resting in bed.  She is sitting up by the edge of the bed.  Currently she is on 4 L of oxygen via nasal cannula.  Introduced myself and palliative care as follows: Palliative medicine is specialized medical care for people living with serious illness. It focuses on providing relief from the symptoms and stress of a serious illness. The goal is to improve quality of life for both the patient and the family.  Goals of care: Broad aims of medical therapy in relation to the patient's values and preferences. Our aim is to provide medical care aimed at enabling patients to achieve the goals that matter most to them, given the circumstances of their particular medical situation and their constraints.   Reviewed about patient's current hospitalization, we reviewed about serious life limiting and irreversible nature of chronic obstructive pulmonary disease.  Patient states that she is eager to quit smoking.  She follows with Dr. Maple HudsonYoung with pulmonology in the outpatient  setting.  We discussed about CODE STATUS and I described to her the full scope of resuscitative attempt.  Patient states that she would like to remain full code and request for full scope care for now.  She states she does not want to be on a ventilator long-term.  However, she states that that something she would like to discuss one-on-one with her daughter first.  Answered all of her questions and concerns to the best of my ability.  NEXT OF KIN Lives at home with her daughter and daughter's boyfriend and granddaughter in [place], [place] .  SUMMARY OF RECOMMENDATIONS   Full code/full scope care for now as per patient wishes.  CODE STATUS and broad goals of care discussions undertaken at some length today at the time of initial consultation.   Recommend outpatient palliative support at discharge.  Code Status/Advance Care Planning:  Full code    Symptom Management:      Palliative Prophylaxis:   Bowel Regimen   Psycho-social/Spiritual:   Desire for further Chaplaincy support:yes  Additional Recommendations: Caregiving  Support/Resources  Prognosis:   Unable to determine  Discharge Planning: To Be Determined      Primary Diagnoses: Present on Admission: .  Acute exacerbation of chronic obstructive pulmonary disease (COPD) (HCC)   I have reviewed the medical record, interviewed the patient and family, and examined the patient. The following aspects are pertinent.  Past Medical History:  Diagnosis Date  . Allergic rhinitis, seasonal   . Allergy   . Anxiety   . Arthritis    hands, knees,neck, shoulders  . Asthma   . COPD (chronic obstructive pulmonary disease) (HCC)   . Depression    no meds  . Diabetes mellitus without complication (HCC)   . Fibromyalgia   . GERD (gastroesophageal reflux disease)   . Hepatitis C 2011  . Hypertension   . On home oxygen therapy    2 Liters as needed - is suppsoed to wear 24 hours but uses when gets short of breath with  activity  . Oxygen deficiency    2 Liters   . Pneumonia   . Shortness of breath   . Sinusitis   . SVD (spontaneous vaginal delivery)    x 1   Social History   Socioeconomic History  . Marital status: Divorced    Spouse name: Not on file  . Number of children: Not on file  . Years of education: Not on file  . Highest education level: Not on file  Occupational History  . Occupation: housemate  Tobacco Use  . Smoking status: Current Every Day Smoker    Packs/day: 0.50    Years: 40.00    Pack years: 20.00    Types: Cigarettes  . Smokeless tobacco: Never Used  . Tobacco comment: 1 pack per day   Vaping Use  . Vaping Use: Never used  Substance and Sexual Activity  . Alcohol use: No    Alcohol/week: 3.0 standard drinks    Types: 3 Cans of beer per week  . Drug use: No  . Sexual activity: Yes    Birth control/protection: Post-menopausal  Other Topics Concern  . Not on file  Social History Narrative   Pt graduated 12 th grade   dtr 15   Family hx of copd, MI and cva   Friend 14 common law   Social Determinants of Health   Financial Resource Strain:   . Difficulty of Paying Living Expenses: Not on file  Food Insecurity:   . Worried About Programme researcher, broadcasting/film/video in the Last Year: Not on file  . Ran Out of Food in the Last Year: Not on file  Transportation Needs:   . Lack of Transportation (Medical): Not on file  . Lack of Transportation (Non-Medical): Not on file  Physical Activity:   . Days of Exercise per Week: Not on file  . Minutes of Exercise per Session: Not on file  Stress:   . Feeling of Stress : Not on file  Social Connections:   . Frequency of Communication with Friends and Family: Not on file  . Frequency of Social Gatherings with Friends and Family: Not on file  . Attends Religious Services: Not on file  . Active Member of Clubs or Organizations: Not on file  . Attends Banker Meetings: Not on file  . Marital Status: Not on file   Family  History  Problem Relation Age of Onset  . COPD Mother   . Colon cancer Father        unsure of age at dx  . Lung cancer Father        smoker   . Breast cancer Sister   . Bone cancer Paternal  Aunt   . Colon polyps Neg Hx   . Esophageal cancer Neg Hx   . Rectal cancer Neg Hx   . Stomach cancer Neg Hx    Scheduled Meds: . azithromycin  250 mg Oral Daily  . Chlorhexidine Gluconate Cloth  6 each Topical Daily  . enoxaparin (LOVENOX) injection  30 mg Subcutaneous Q24H  . fluticasone furoate-vilanterol  1 puff Inhalation Daily  . insulin aspart  1-3 Units Subcutaneous Q4H  . mouth rinse  15 mL Mouth Rinse BID  . mupirocin ointment  1 application Nasal BID  . nicotine  21 mg Transdermal Q24H  . pantoprazole  40 mg Oral Daily  . predniSONE  40 mg Oral Q breakfast  . umeclidinium bromide  1 puff Inhalation Daily   Continuous Infusions: . acetaminophen    . dexmedetomidine (PRECEDEX) IV infusion Stopped (07/16/20 1038)   PRN Meds:.acetaminophen, ALPRAZolam, docusate sodium, ipratropium-albuterol, ondansetron (ZOFRAN) IV, oxyCODONE, polyethylene glycol Medications Prior to Admission:  Prior to Admission medications   Medication Sig Start Date End Date Taking? Authorizing Provider  albuterol (PROVENTIL) (2.5 MG/3ML) 0.083% nebulizer solution Take 3 mLs (2.5 mg total) by nebulization every 6 (six) hours as needed for wheezing or shortness of breath. 01/15/20  Yes Young, Clinton D, MD  albuterol (VENTOLIN HFA) 108 (90 Base) MCG/ACT inhaler INHALE 2 PUFFS INTO THE LUNGS EVERY 4 HOURS AS NEEDED FOR WHEEZING OR SHORTNESS OF BREATH. 02/18/20  Yes Young, Clinton D, MD  ALPRAZolam (XANAX) 1 MG tablet TAKE 1 TABLET BY MOUTH 3 TIMES A DAY AS NEEDED FOR ANXIETY Patient taking differently: Take 1 mg by mouth 3 (three) times daily as needed for anxiety. TAKE 1 TABLET BY MOUTH 3 TIMES A DAY AS NEEDED FOR ANXIETY 05/09/20  Yes Young, Clinton D, MD  amLODipine (NORVASC) 5 MG tablet TAKE 1 TABLET BY MOUTH  DAILY. Patient taking differently: Take 5 mg by mouth daily.  04/10/20  Yes Welborn, Ryan, DO  bisoprolol-hydrochlorothiazide (ZIAC) 5-6.25 MG tablet TAKE 1 TABLET BY MOUTH DAILY. 05/11/20  Yes Welborn, Ryan, DO  buPROPion (WELLBUTRIN SR) 150 MG 12 hr tablet Take 1 tablet (150 mg total) by mouth 2 (two) times daily. 05/20/20  Yes Young, Joni Fears D, MD  cetirizine (ZYRTEC) 10 MG tablet Take 1 tablet (10 mg total) by mouth daily. 10/23/15  Yes Young, Joni Fears D, MD  cholecalciferol (VITAMIN D3) 25 MCG (1000 UNIT) tablet Take 1,000 Units by mouth daily.   Yes [provider]  COMBIVENT RESPIMAT 20-100 MCG/ACT AERS respimat INHALE 1 PUFF EVERY 6 HOURS AS NEEDED Patient taking differently: Inhale 1 puff into the lungs every 6 (six) hours as needed for wheezing or shortness of breath.  06/25/20  Yes Young, Joni Fears D, MD  cyclobenzaprine (FLEXERIL) 10 MG tablet TAKE 1 TABLET BY MOUTH 3 TIMES DAILY AS NEEDED FOR MUSCLE SPASMS. Patient taking differently: Take 10 mg by mouth 3 (three) times daily as needed for muscle spasms.  06/30/20  Yes Welborn, Ryan, DO  Fluticasone-Umeclidin-Vilant (TRELEGY ELLIPTA) 100-62.5-25 MCG/INH AEPB Inhale 1 puff into the lungs daily. 02/20/20  Yes Glenford Bayley, NP  lisinopril (ZESTRIL) 20 MG tablet TAKE 1 TABLET BY MOUTH DAILY Patient taking differently: Take 20 mg by mouth daily.  06/11/20  Yes Welborn, Ryan, DO  metFORMIN (GLUCOPHAGE) 1000 MG tablet TAKE 1/2 TABLET BY MOUTH TWICE A DAY FOR DIABETES. (AFTER BREAKFAST AND AFTER SUPPER) Patient taking differently: Take 500 mg by mouth 2 (two) times daily with a meal.  04/10/20  Yes  Jackelyn Poling, DO  Multiple Vitamin (MULTIVITAMIN WITH MINERALS) TABS Take 1 tablet by mouth daily.   Yes [provider]  omeprazole (PRILOSEC) 40 MG capsule TAKE 1 CAPSULE BY MOUTH DAILY. Patient taking differently: Take 40 mg by mouth daily.  04/10/20  Yes Welborn, Ryan, DO  OVER THE COUNTER MEDICATION Take 1 tablet by mouth daily.  musinex   Yes [provider]  oxyCODONE (ROXICODONE) 15 MG immediate release tablet Take 15 mg by mouth 4 (four) times daily as needed for pain. 07/11/20  Yes [provider]  OXYGEN Inhale 2-3 L into the lungs daily.    Yes [provider]  predniSONE (DELTASONE) 10 MG tablet Take 1 tablet (10 mg total) by mouth daily with breakfast. 06/16/20  Yes Young, Clinton D, MD  amoxicillin (AMOXIL) 500 MG tablet Take 1 tablet (500 mg total) by mouth 2 (two) times daily. Patient not taking: Reported on 07/16/2020 06/27/20   Waymon Budge, MD  amoxicillin-clavulanate (AUGMENTIN) 875-125 MG tablet Take 1 tablet by mouth 2 (two) times daily. Patient not taking: Reported on 07/16/2020 04/01/20   Jackelyn Poling, DO  Budeson-Glycopyrrol-Formoterol (BREZTRI AEROSPHERE) 160-9-4.8 MCG/ACT AERO Inhale 2 puffs into the lungs in the morning and at bedtime. Patient not taking: Reported on 07/16/2020 04/16/20   Bevelyn Ngo, NP  doxycycline (VIBRA-TABS) 100 MG tablet Take 1 tablet (100 mg total) by mouth 2 (two) times daily. Patient not taking: Reported on 07/16/2020 06/23/20   Waymon Budge, MD   Allergies  Allergen Reactions  . Aspirin Nausea And Vomiting   Review of Systems Complains of some shortness of breath Physical Exam Cachectic and frail appearing lady sitting up by the edge of the bed Diminished breath sounds Awake alert oriented S1-S2 Abdomen is not distended Nonfocal  Vital Signs: BP (!) 129/97   Pulse 98   Temp 98.4 F (36.9 C) (Oral)   Resp 20   Ht 5\' 2"  (1.575 m)   Wt 33.6 kg   SpO2 98%   BMI 13.55 kg/m  Pain Scale: 0-10   Pain Score: 2    SpO2: SpO2: 98 % O2 Device:SpO2: 98 % O2 Flow Rate: .O2 Flow Rate (L/min): 6 L/min (decreased to 4 LPM)  IO: Intake/output summary:   Intake/Output Summary (Last 24 hours) at 07/16/2020 1504 Last data filed at 07/16/2020 1217 Gross per 24 hour  Intake 1550.34 ml  Output --  Net 1550.34 ml    LBM: Last BM  Date: 07/16/20 Baseline Weight: Weight: 33.6 kg Most recent weight: Weight: 33.6 kg     Palliative Assessment/Data:   PPS 40%  Time In:  1300 Time Out:  1400 Time Total:  60  Greater than 50%  of this time was spent counseling and coordinating care related to the above assessment and plan.  Signed by: 14/08/21, MD   Please contact Palliative Medicine Team phone at [phone] for questions and concerns.  For individual provider: See [phone]

## 2020-07-16 NOTE — Plan of Care (Signed)
PDMP reviewed- receiving Xanax 1mg  TID and oxycodone 15mg  TID PTA.  , DO 07/16/20 9:40 AM [facility]

## 2020-07-16 NOTE — Progress Notes (Signed)
NAME:  Megan Soto, MRN:  [id number], DOB:  25-May-1961, LOS: 1 ADMISSION DATE:  07/15/2020, CONSULTATION DATE:  07/15/20 REFERRING MD:  Kizzie Bane, CHIEF COMPLAINT:  hypercapnia   Brief History   Acute COPD exacerbation, on home O2, still smoking.  History of present illness   Megan Soto is a 59 year old woman with a history of severe COPD and chronic hypoxic respiratory failure on 2 L home oxygen who presents via EMS due to shortness of breath.  She says that she has been feeling more short of breath for the last month and half.  She has been on antibiotics (amoxicillin; had a rash after starting doxycycline) and prednisone in November.  She reports that she has been compliant with her Breztri.  She continues to smoke 1.5 packs/day, but plans on today to be her quit date.  At her most recent office follow-up in mid November she was noted to be on a progressive decline with her disease.  Fev1 1.2 L (49% of predicted) in 2018  Past Medical History  COPD on 2L O2, ongoing tobacco Diabetes Fibromyalgia Hepatitis C Hypertension  Significant Hospital Events   BiPAP in the ED 12/7  Consults:    Procedures:    Significant Diagnostic Tests:    Micro Data:  covid negative resp culture 12/7>  Antimicrobials:  Azithromycin 12/7> Ceftriaxone 12/7>  Interim history/subjective:  Agitated overnight requiring precedex. Complains of pain this morning- legs and back, chronic. Reports that her breathing isn't far off her baseline.  Objective   Blood pressure 96/68, pulse 75, temperature 98.7 F (37.1 C), temperature source Axillary, resp. rate 16, height 5\' 2"  (1.575 m), weight 33.6 kg, SpO2 100 %.    Vent Mode: PCV FiO2 (%):  [30 %-45 %] 30 % Set Rate:  [15 bmp-18 bmp] 18 bmp PEEP:  [6 cmH20] 6 cmH20 Pressure Support:  [12 cmH20-15 cmH20] 15 cmH20   Intake/Output Summary (Last 24 hours) at 07/16/2020 14/03/2020 Last data filed at 07/16/2020 0600 Gross per 24 hour  Intake 1531.26 ml  Output  --  Net 1531.26 ml   Filed Weights   07/15/20 1950 07/15/20 2025 07/16/20 0500  Weight: 33.6 kg 33.6 kg 33.6 kg    Examination: General: cachectic frail appearing elderly woman sitting up in bed, no respiratory distress HENT: [place]/AT, eyes anicteric Lungs: barrel chest, minimal air movement bilaterally. Occasionally using accessory muscles, tachypneic. Able to speak in mildly truncated sentences. Cardiovascular: distant heart sounds, RRR Abdomen: nondistended, soft Extremities: no cyaosis or edema Neuro: awake, alert, answering questions appropriately, moving all extremities spontaneously Derm: thin skin, excoriations on her feet bilaterally   Resolved Hospital Problem list     Assessment & Plan:  Acute on chronic hypoxic and hypercapnic respiratory failure due to acute COPD exacerbation. -Con't supplemental O2 to maintain SpO2 88-92%. Avoid hyperoxia. -BiPAP PRN for comfort -Azithromycin x 5 days. With negative PCT, can discontinue ceftriaxone. -Bronchodilators every 4 hours PRN.  Adding Breo and Incruse daily. On trelegy at home. -Steroids - switching to prenisone -Sputum culture if able to produce one. -Strongly recommend complete tobacco cessation. Counseled again today. Con't nicotine replacement therapy. -Titrate FiO2 to maintain SPO2 88 to 92%.  Avoid hyperoxia. -Avoid potentially sedating meds-- unfortunately she is on opiates and benzos at home. -Palliative care medicine consult for end-stage COPD to help clarify short and long-term goals of care.  Tobacco abuse, ongoing -Nicotine patch daily -Counseled on importance of quitting smoking  Hyponatremia, concern for poor p.o. intake -Continue to monitor -  Resume normal diet  Hyperkalemia, likely iatrogenic from supplementation -Recheck this afternoon  Severe protein energy malnutrition, likely due to chronic respiratory failure -resume normal diet  Acute anemia- stable -Follow-up CBC tomorrow -Iron studies and  further work-up recommended as an outpatient  Hyperglycemia -Continue Accu-Cheks with sliding scale insulin -Goal BG 140-180  Best practice (evaluated daily)   Diet: NPO Pain/Anxiety/Delirium protocol (if indicated): n/a VAP protocol (if indicated): n/a DVT prophylaxis: enoxaparin GI prophylaxis: n/a Glucose control: SSI Mobility: progressive last date of multidisciplinary goals of care discussion Family and staff present  Summary of discussion  Follow up goals of care discussion due Code Status: full Disposition: SD  Labs   CBC: Recent Labs  Lab 07/15/20 1247 07/15/20 1302 07/15/20 2044 07/16/20 0308  WBC 12.0*  --  10.0 6.0  NEUTROABS 10.3*  --   --   --   HGB 10.5* 6.5* 11.5* 10.4*  HCT 32.9* 19.0* 35.8* 32.5*  MCV 92.9  --  93.2 93.9  PLT 267  --  241 185    Basic Metabolic Panel: Recent Labs  Lab 07/15/20 1247 07/15/20 1302 07/16/20 0308  NA 131* 141 128*  K 3.7 2.1* 5.6*  CL 93* 108 93*  CO2 29  --  27  GLUCOSE 128* 78 119*  BUN 10 5* 15  CREATININE 0.74 0.30* 0.86  CALCIUM 7.4*  --  8.1*  MG  --   --  1.8  PHOS  --   --  3.2   GFR: Estimated Creatinine Clearance: 36.4 mL/min (by C-G formula based on SCr of 0.86 mg/dL). Recent Labs  Lab 07/15/20 1247 07/15/20 2044 07/16/20 0308  PROCALCITON  --  <0.10  --   WBC 12.0* 10.0 6.0    Liver Function Tests: Recent Labs  Lab 07/15/20 1247  AST 27  ALT 30  ALKPHOS 51  BILITOT 0.7  PROT 5.7*  ALBUMIN 3.2*   No results for input(s): LIPASE, AMYLASE in the last 168 hours. No results for input(s): AMMONIA in the last 168 hours.  ABG    Component Value Date/Time   PHART 7.199 (LL) 07/15/2020 1715   PCO2ART 81.4 (HH) 07/15/2020 1715   PO2ART 69.3 (L) 07/15/2020 1715   HCO3 29.2 (H) 07/16/2020 0308   TCO2 20 (L) 07/15/2020 1302   ACIDBASEDEF 0.3 05/27/2010 1618   O2SAT 86.3 07/16/2020 0308     Coagulation Profile: No results for input(s): INR, PROTIME in the last 168 hours.  Cardiac  Enzymes: No results for input(s): CKTOTAL, CKMB, CKMBINDEX, TROPONINI in the last 168 hours.  HbA1C: Hemoglobin A1C  Date/Time Value Ref Range Status  11/22/2017 03:43 PM 5.2  Final   HbA1c, POC (controlled diabetic range)  Date/Time Value Ref Range Status  03/31/2020 02:33 PM 5.5 0.0 - 7.0 % Final   Hgb A1c MFr Bld  Date/Time Value Ref Range Status  05/28/2010 05:24 AM (H) <5.7 % Final   6.0 (NOTE)                                                                       According to the ADA Clinical Practice Recommendations for 2011, when HbA1c is used as a screening test:   >=6.5%   Diagnostic of Diabetes Mellitus           (  if abnormal result  is confirmed)  5.7-6.4%   Increased risk of developing Diabetes Mellitus  References:Diagnosis and Classification of Diabetes Mellitus,Diabetes Care,2011,34(Suppl 1):S62-S69 and Standards of Medical Care in         Diabetes - 2011,Diabetes Care,2011,34  (Suppl 1):S11-S61.  02/05/2010 04:40 PM (H) <5.7 % Final   6.1 (NOTE)                                                                       According to the ADA Clinical Practice Recommendations for 2011, when HbA1c is used as a screening test:   >=6.5%   Diagnostic of Diabetes Mellitus           (if abnormal result  is confirmed)  5.7-6.4%   Increased risk of developing Diabetes Mellitus  References:Diagnosis and Classification of Diabetes Mellitus,Diabetes Care,2011,34(Suppl 1):S62-S69 and Standards of Medical Care in         Diabetes - 2011,Diabetes Care,2011,34  (Suppl 1):S11-S61.    CBG: Recent Labs  Lab 07/15/20 1813 07/15/20 2014 07/15/20 2348 07/16/20 0329  GLUCAP 177* 102* 117* 140*      [address] [place], DO 07/16/20 8:39 AM [facility]

## 2020-07-17 DIAGNOSIS — J9622 Acute and chronic respiratory failure with hypercapnia: Secondary | ICD-10-CM

## 2020-07-17 DIAGNOSIS — J9621 Acute and chronic respiratory failure with hypoxia: Secondary | ICD-10-CM

## 2020-07-17 LAB — GLUCOSE, CAPILLARY
Glucose-Capillary: 118 mg/dL — ABNORMAL HIGH (ref 70–99)
Glucose-Capillary: 120 mg/dL — ABNORMAL HIGH (ref 70–99)
Glucose-Capillary: 145 mg/dL — ABNORMAL HIGH (ref 70–99)
Glucose-Capillary: 177 mg/dL — ABNORMAL HIGH (ref 70–99)
Glucose-Capillary: 203 mg/dL — ABNORMAL HIGH (ref 70–99)
Glucose-Capillary: 210 mg/dL — ABNORMAL HIGH (ref 70–99)
Glucose-Capillary: 219 mg/dL — ABNORMAL HIGH (ref 70–99)

## 2020-07-17 LAB — BASIC METABOLIC PANEL
Anion gap: 6 (ref 5–15)
BUN: 15 mg/dL (ref 8–23)
CO2: 31 mmol/L (ref 22–32)
Calcium: 8.2 mg/dL — ABNORMAL LOW (ref 8.9–10.3)
Chloride: 92 mmol/L — ABNORMAL LOW (ref 98–111)
Creatinine, Ser: 0.77 mg/dL (ref 0.44–1.00)
GFR, Estimated: >60 mL/min (ref >60)
Glucose, Bld: 97 mg/dL (ref 70–99)
Potassium: 5 mmol/L (ref 3.5–5.1)
Sodium: 129 mmol/L — ABNORMAL LOW (ref 135–145)

## 2020-07-17 LAB — BLOOD GAS, VENOUS
Acid-Base Excess: 4.7 mmol/L — ABNORMAL HIGH (ref 0.0–2.0)
Bicarbonate: 34.5 mmol/L — ABNORMAL HIGH (ref 20.0–28.0)
FIO2: 21
O2 Saturation: 65.1 %
Patient temperature: 37
pCO2, Ven: 85.7 mmHg (ref 44.0–60.0)
pH, Ven: 7.228 — ABNORMAL LOW (ref 7.250–7.430)
pO2, Ven: 40.6 mmHg (ref 32.0–45.0)

## 2020-07-17 MED ORDER — ARFORMOTEROL TARTRATE 15 MCG/2ML IN NEBU
15.0000 ug | INHALATION_SOLUTION | Freq: Two times a day (BID) | RESPIRATORY_TRACT | Status: DC
  Administered 2020-07-17 – 2020-07-18 (×4): 15 ug via RESPIRATORY_TRACT
  Filled 2020-07-17 (×5): qty 2

## 2020-07-17 MED ORDER — MORPHINE SULFATE (PF) 2 MG/ML IV SOLN
1.0000 mg | Freq: Once | INTRAVENOUS | Status: AC
  Administered 2020-07-17: 1 mg via INTRAVENOUS
  Filled 2020-07-17: qty 1

## 2020-07-17 MED ORDER — ENSURE ENLIVE PO LIQD
237.0000 mL | Freq: Two times a day (BID) | ORAL | Status: DC
  Administered 2020-07-18 (×2): 237 mL via ORAL

## 2020-07-17 MED ORDER — IPRATROPIUM-ALBUTEROL 0.5-2.5 (3) MG/3ML IN SOLN
3.0000 mL | Freq: Four times a day (QID) | RESPIRATORY_TRACT | Status: DC
  Administered 2020-07-17 – 2020-07-18 (×4): 3 mL via RESPIRATORY_TRACT
  Filled 2020-07-17 (×4): qty 3

## 2020-07-17 MED ORDER — BUDESONIDE 0.5 MG/2ML IN SUSP
0.5000 mg | Freq: Two times a day (BID) | RESPIRATORY_TRACT | Status: DC
  Administered 2020-07-17 – 2020-07-18 (×4): 0.5 mg via RESPIRATORY_TRACT
  Filled 2020-07-17 (×5): qty 2

## 2020-07-17 MED ORDER — PROSOURCE PLUS PO LIQD
30.0000 mL | Freq: Every day | ORAL | Status: DC
  Filled 2020-07-17 (×2): qty 30

## 2020-07-17 NOTE — Progress Notes (Signed)
Pt work of breathing increased. Had 2 other nurses look at pt. Pt's mental status has changed. Respiratory team called to look at pt. Pt placed on bipap. Dr. Chestine Spore was notified. Dr. Merry Proud came to look at pt and put in orders to give 1 mg of morphine. This nurse will continue to monitor.

## 2020-07-17 NOTE — Progress Notes (Signed)
NAME:  Megan Soto, MRN:  [id number], DOB:  05-21-1961, LOS: 2 ADMISSION DATE:  07/15/2020, CONSULTATION DATE:  07/15/20 REFERRING MD:  Kizzie Bane, CHIEF COMPLAINT:  hypercapnia   Brief History   Acute COPD exacerbation, on home O2, still smoking.  History of present illness   Megan Soto is a 59 year old woman with a history of severe COPD and chronic hypoxic respiratory failure on 2 L home oxygen who presents via EMS due to shortness of breath.  She says that she has been feeling more short of breath for the last month and half.  She has been on antibiotics (amoxicillin; had a rash after starting doxycycline) and prednisone in November.  She reports that she has been compliant with her Breztri.  She continues to smoke 1.5 packs/day, but plans on today to be her quit date.  At her most recent office follow-up in mid November she was noted to be on a progressive decline with her disease.  Fev1 1.2 L (49% of predicted) in 2018  Past Medical History  COPD on 2L O2, ongoing tobacco Diabetes Fibromyalgia Hepatitis C Hypertension  Significant Hospital Events   BiPAP in the ED 12/7  Consults:    Procedures:    Significant Diagnostic Tests:    Micro Data:  covid negative resp culture 12/7>  Antimicrobials:  Azithromycin 12/7> Ceftriaxone 12/7>  Interim history/subjective:  Slept well overnight, denies complaints this morning. Did not require PRN duonebs yesterday.   Objective   Blood pressure (!) 151/137, pulse 95, temperature (!) 97.4 F (36.3 C), temperature source Oral, resp. rate 17, height 5\' 2"  (1.575 m), weight 37.3 kg, SpO2 100 %.        Intake/Output Summary (Last 24 hours) at 07/17/2020 0744 Last data filed at 07/17/2020 0400 Gross per 24 hour  Intake 419.08 ml  Output 400 ml  Net 19.08 ml   Filed Weights   07/15/20 2025 07/16/20 0500 07/17/20 0500  Weight: 33.6 kg 33.6 kg 37.3 kg    Examination: General: chronically ill appearing woman laying in bed in  NAD HENT: [place]/AT Lungs: barrel chest, no wheezing, no accessory muscle use, no tachypnea Cardiovascular: distant heart sounds Abdomen: thin, nondistended Extremities: no edema or cyanosis Neuro: sleeping but arousable, moving all extremities Derm: thin skin, no rashes   Resolved Hospital Problem list   Hypo and hyperkalemia  Assessment & Plan:  Acute on chronic hypoxic and hypercapnic respiratory failure due to acute COPD exacerbation. -Con't supplemental O2 to maintain SpO2 88-92%. Avoid hyperoxia. O2 needs to be titrated down today. Can walk today to assess how far she is from her home requirements. -BiPAP PRN for comfort -Azithromycin x 5 days total -Breo & Incruse daily. Duonebs Q4h PRN. -Prednisone 40mg  to complete 5 days of steroids. -Strongly recommend complete tobacco cessation.  -Use caution with potentially sedating meds-- she is on opiates and benzos at home. -Palliative care medicine consult for end-stage COPD to help clarify short and long-term goals of care. Appreciate their input.  Tobacco abuse, ongoing -Nicotine patch daily -Counseled on importance of quitting smoking  Hyponatremia, concern for poor p.o. intake chronically -Continue to monitor -Resume normal diet  Severe protein energy malnutrition, likely due to chronic respiratory failure -resume normal diet -RD consult   Acute anemia- stable -Follow-up CBC tomorrow -Iron studies and further work-up recommended as an outpatient  Hyperglycemia- controlled -Continue Accu-Cheks with sliding scale insulin -Goal BG 140-180  Chronic pain  -con't PTA oxycodone- dose decreased to 10mg  from baseline 15mg  given  concern for respiratory decompensation  GoC -Appreciate Palliative Care team's input. Agree with their recommendation for long-term outpatient follow up given chronic pain and anxiety issues in addition to severe COPD  Best practice (evaluated daily)   Diet: NPO Pain/Anxiety/Delirium protocol (if  indicated): n/a VAP protocol (if indicated): n/a DVT prophylaxis: enoxaparin GI prophylaxis: n/a Glucose control: SSI Mobility: progressive last date of multidisciplinary goals of care discussion Family and staff present  Summary of discussion  Follow up goals of care discussion due Code Status: full Disposition: SD  Labs   CBC: Recent Labs  Lab 07/15/20 1247 07/15/20 1302 07/15/20 2044 07/16/20 0308  WBC 12.0*  --  10.0 6.0  NEUTROABS 10.3*  --   --   --   HGB 10.5* 6.5* 11.5* 10.4*  HCT 32.9* 19.0* 35.8* 32.5*  MCV 92.9  --  93.2 93.9  PLT 267  --  241 185    Basic Metabolic Panel: Recent Labs  Lab 07/15/20 1247 07/15/20 1302 07/16/20 0308 07/16/20 1237 07/17/20 0247  NA 131* 141 128* 127* 129*  K 3.7 2.1* 5.6* 4.9 5.0  CL 93* 108 93* 91* 92*  CO2 29  --  27 29 31   GLUCOSE 128* 78 119* 112* 97  BUN 10 5* 15 17 15   CREATININE 0.74 0.30* 0.86 0.85 0.77  CALCIUM 7.4*  --  8.1* 8.3* 8.2*  MG  --   --  1.8  --   --   PHOS  --   --  3.2  --   --    GFR: Estimated Creatinine Clearance: 43.5 mL/min (by C-G formula based on SCr of 0.77 mg/dL). Recent Labs  Lab 07/15/20 1247 07/15/20 2044 07/16/20 0308  PROCALCITON  --  <0.10  --   WBC 12.0* 10.0 6.0    Liver Function Tests: Recent Labs  Lab 07/15/20 1247  AST 27  ALT 30  ALKPHOS 51  BILITOT 0.7  PROT 5.7*  ALBUMIN 3.2*   No results for input(s): LIPASE, AMYLASE in the last 168 hours. No results for input(s): AMMONIA in the last 168 hours.  ABG    Component Value Date/Time   PHART 7.199 (LL) 07/15/2020 1715   PCO2ART 81.4 (HH) 07/15/2020 1715   PO2ART 69.3 (L) 07/15/2020 1715   HCO3 29.2 (H) 07/16/2020 0308   TCO2 20 (L) 07/15/2020 1302   ACIDBASEDEF 0.3 05/27/2010 1618   O2SAT 86.3 07/16/2020 0308     Coagulation Profile: No results for input(s): INR, PROTIME in the last 168 hours.  Cardiac Enzymes: No results for input(s): CKTOTAL, CKMB, CKMBINDEX, TROPONINI in the last 168  hours.  HbA1C: Hemoglobin A1C  Date/Time Value Ref Range Status  11/22/2017 03:43 PM 5.2  Final   HbA1c, POC (controlled diabetic range)  Date/Time Value Ref Range Status  03/31/2020 02:33 PM 5.5 0.0 - 7.0 % Final   Hgb A1c MFr Bld  Date/Time Value Ref Range Status  05/28/2010 05:24 AM (H) <5.7 % Final   6.0 (NOTE)                                                                       According to the ADA Clinical Practice Recommendations for 2011, when HbA1c is used as a screening test:   >=  6.5%   Diagnostic of Diabetes Mellitus           (if abnormal result  is confirmed)  5.7-6.4%   Increased risk of developing Diabetes Mellitus  References:Diagnosis and Classification of Diabetes Mellitus,Diabetes Care,2011,34(Suppl 1):S62-S69 and Standards of Medical Care in         Diabetes - 2011,Diabetes Care,2011,34  (Suppl 1):S11-S61.  02/05/2010 04:40 PM (H) <5.7 % Final   6.1 (NOTE)                                                                       According to the ADA Clinical Practice Recommendations for 2011, when HbA1c is used as a screening test:   >=6.5%   Diagnostic of Diabetes Mellitus           (if abnormal result  is confirmed)  5.7-6.4%   Increased risk of developing Diabetes Mellitus  References:Diagnosis and Classification of Diabetes Mellitus,Diabetes Care,2011,34(Suppl 1):S62-S69 and Standards of Medical Care in         Diabetes - 2011,Diabetes Care,2011,34  (Suppl 1):S11-S61.    CBG: Recent Labs  Lab 07/16/20 0329 07/16/20 0824 07/16/20 1152 07/16/20 1606 07/17/20 0330  GLUCAP 140* 99 148* 156* 118*      Steffanie Dunn, DO 07/17/20 7:57 AM [facility]

## 2020-07-17 NOTE — Progress Notes (Signed)
Called to bedside to assess patient for dyspnea.  Patient sitting up in bed leaning forward on BiPAP (just placed on BiPAP per RT).  Pulling adequate volumes. Diffuse wheezing on exam bilaterally with mild accessory muscle usage. ST on monitor.  Following commands / intact mental status.   Plan: -change inhalers to Brovana + Pulmicort BID  -schedule duoneb Q6 for next 24 hours, then change to Q4 PRN -Morphine 1mg  IV x1 now  -will reassess / follow closely for respiratory change   , MSN, NP-C, AGACNP-BC [facility] 07/17/2020, 10:52 AM   Please see [email or web address] for pager details.

## 2020-07-17 NOTE — Progress Notes (Signed)
Pt was found with prescription meds in room. Charge nurse took med to pharmacy to lock up. Pt consented to belongings being searched by nurse. I found two pocket knifes among pt's belongings. Pt's pocket knifes placed in envelope and brought to security to lock up until pt is discharged.

## 2020-07-17 NOTE — Progress Notes (Signed)
Patient has been asleep with BI-Pap on since 1900, when she woke up, stating, " that I am not wearing that mask again, and I would rather die than wear the mask again."

## 2020-07-17 NOTE — Plan of Care (Signed)
Problem: Activity: Goal: Ability to tolerate increased activity will improve Outcome: Not Progressing   Problem: Respiratory: Goal: Ability to maintain adequate ventilation will improve Outcome: Not Progressing   Problem: Clinical Measurements: Goal: Respiratory complications will improve Outcome: Not Progressing   Problem: Activity: Goal: Risk for activity intolerance will decrease Outcome: Not Progressing   Problem: Coping: Goal: Level of anxiety will decrease Outcome: Not Progressing   Problem: Activity: Goal: Will verbalize the importance of balancing activity with adequate rest periods Outcome: Progressing   Problem: Respiratory: Goal: Ability to maintain a clear airway will improve Outcome: Progressing Goal: Levels of oxygenation will improve Outcome: Progressing   Problem: Education: Goal: Knowledge of General Education information will improve Description: Including pain rating scale, medication(s)/side effects and non-pharmacologic comfort measures Outcome: Progressing   Problem: Health Behavior/Discharge Planning: Goal: Ability to manage health-related needs will improve Outcome: Progressing   Problem: Clinical Measurements: Goal: Ability to maintain clinical measurements within normal limits will improve Outcome: Progressing Goal: Will remain free from infection Outcome: Progressing Goal: Diagnostic test results will improve Outcome: Progressing Goal: Cardiovascular complication will be avoided Outcome: Progressing   Problem: Nutrition: Goal: Adequate nutrition will be maintained Outcome: Progressing   Problem: Elimination: Goal: Will not experience complications related to bowel motility Outcome: Progressing Goal: Will not experience complications related to urinary retention Outcome: Progressing   Problem: Pain Managment: Goal: General experience of comfort will improve Outcome: Progressing   Problem: Safety: Goal: Ability to remain free  from injury will improve Outcome: Progressing   Problem: Skin Integrity: Goal: Risk for impaired skin integrity will decrease Outcome: Progressing   Problem: Education: Goal: Ability to describe self-care measures that may prevent or decrease complications (Diabetes Survival Skills Education) will improve Outcome: Progressing Goal: Individualized Educational Video(s) Outcome: Progressing   Problem: Coping: Goal: Ability to adjust to condition or change in health will improve Outcome: Progressing   Problem: Fluid Volume: Goal: Ability to maintain a balanced intake and output will improve Outcome: Progressing   Problem: Health Behavior/Discharge Planning: Goal: Ability to identify and utilize available resources and services will improve Outcome: Progressing Goal: Ability to manage health-related needs will improve Outcome: Progressing   Problem: Metabolic: Goal: Ability to maintain appropriate glucose levels will improve Outcome: Progressing   Problem: Nutritional: Goal: Maintenance of adequate nutrition will improve Outcome: Progressing Goal: Progress toward achieving an optimal weight will improve Outcome: Progressing   Problem: Skin Integrity: Goal: Risk for impaired skin integrity will decrease Outcome: Progressing   Problem: Tissue Perfusion: Goal: Adequacy of tissue perfusion will improve Outcome: Progressing

## 2020-07-17 NOTE — Progress Notes (Signed)
Initial Nutrition Assessment  DOCUMENTATION CODES:   Underweight  INTERVENTION:  - will order Ensure Enlive po BID, each supplement provides 350 kcal and 20 grams of protein - will order Magic Cup with dinner meals, each supplement provides 290 kcal and 9 grams of protein. - will order 30 ml Prosource Plus once/day, each supplement provides 100 kcal and 15 grams protein.  - complete NFPE at follow-up.  - provide meal-related recommendations at follow-up.    NUTRITION DIAGNOSIS:   Increased nutrient needs related to acute illness,chronic illness as evidenced by estimated needs.  GOAL:   Patient will meet greater than or equal to 90% of their needs  MONITOR:   PO intake,Supplement acceptance,Labs,Weight trends  REASON FOR ASSESSMENT:   Consult Assessment of nutrition requirement/status  ASSESSMENT:   59 year old woman with medical history of severe COPD and chronic hypoxic respiratory failure on 2L O2, fibromyalgia, asthma, anxiety, depression, HTN, arthritis, hepatitis C, and GERD. She presented to the ED via EMS due to SOB which has been progressive over the past month and a half. She continues to smoke a pack and a half/day.  Patient discussed in rounds this AM.   Unable to see patient at this time. She ate 80% of breakfast and 100% of lunch today (total of 1393 kcal, 32 grams protein).  She has not been seen by a [place] RD at any time in the past.   Weight today is 82 lb and weight yesterday and 12/7 documented as 74 lb. Weight appears mainly stable for the past 4 months. On 09/26/19 she weighed 89 lb. No information documented in the edema section of flow sheet.   Per notes: - COPD exacerbation - Palliative Care consulted d/t end-stage COPD--patient to remain Full Code at this time - ongoing tobacco abuse - severe protein energy malnutrition - acute anemia   Labs reviewed; CBGs: 118, 120, 210 mg/dl, Na: 973 mmol/l, Cl: 92 mmol/l, Ca: 8.2 mg/dl.  Medications  reviewed; sliding scale novolog, 40 mg oral protonix/day, 40 mg deltasone/day.     NUTRITION - FOCUSED PHYSICAL EXAM:  unable to complete at this time.   Diet Order:   Diet Order            Diet regular Room service appropriate? Yes; Fluid consistency: Thin  Diet effective now                 EDUCATION NEEDS:   Not appropriate for education at this time  Skin:  Skin Assessment: Reviewed RN Assessment  Last BM:  12/8 (type 6 x2)  Height:   Ht Readings from Last 1 Encounters:  07/16/20 5\' 2"  (1.575 m)    Weight:   Wt Readings from Last 1 Encounters:  07/17/20 37.3 kg    Estimated Nutritional Needs:  Kcal:  1490-1700 kcal Protein:  75-90 grams Fluid:  >/= 1.6 L/day      14/09/21, MS, RD, LDN, CNSC Inpatient Clinical Dietitian RD pager # available in AMION  After hours/weekend pager # available in [facility]

## 2020-07-17 NOTE — Evaluation (Signed)
Physical Therapy Evaluation Patient Details Name: Megan Soto MRN: [id number] DOB: [birth year] Today's Date: 07/17/2020   History of Present Illness  59 year old woman with a history of severe COPD and chronic hypoxic respiratory failure on 2 L home oxygen, Fibromyalgis, DM, HTN, hepatitis C who presents via EMS due to shortness of breath. Dx of acute exacerbation of COPD.  Clinical Impression  Pt admitted with above diagnosis. Pt tachycardic at rest, HR 138, RR 28. Min assist for supine to sit, HR 141, SaO2 90% on 2 L O2, 4/4 dyspnea. Did not attempt further mobility due to elevated HR and RR.  Pt currently with functional limitations due to the deficits listed below (see PT Problem List). Pt will benefit from skilled PT to increase their independence and safety with mobility to allow discharge to the venue listed below.       Follow Up Recommendations SNF;Supervision for mobility/OOB;Supervision/Assistance - 24 hour    Equipment Recommendations  None recommended by PT    Recommendations for Other Services       Precautions / Restrictions Precautions Precautions: Fall Precaution Comments: monitor O2/HR Restrictions Weight Bearing Restrictions: No      Mobility  Bed Mobility Overal bed mobility: Needs Assistance Bed Mobility: Supine to Sit     Supine to sit: Min assist;HOB elevated     General bed mobility comments: assist to pivot hips to edge of bed and to initiate movement, HR 138-141 sitting at Edge of bed, RR 28-30, 4/4 dyspnea on 2L O2, SaO2 90-93% on 2L    Transfers                 General transfer comment: didn't attempt due to elevated HR and RR  Ambulation/Gait             General Gait Details: didn't attempt due to elevated HR and RR  Stairs            Wheelchair Mobility    Modified Rankin (Stroke Patients Only)       Balance Overall balance assessment: Needs assistance Sitting-balance support: Feet unsupported;Single extremity  supported Sitting balance-Leahy Scale: Good                                       Pertinent Vitals/Pain Pain Assessment: No/denies pain    Home Living Family/patient expects to be discharged to:: Private residence Living Arrangements: Children Available Help at Discharge: Family;Available 24 hours/day   Home Access: Level entry     Home Layout: One level Home Equipment: Wheelchair - power;Shower seat Additional Comments: pt is poor historian, will need to confirm above info with family, pt stated she primarily uses a power WC    Prior Function Level of Independence: Needs assistance   Gait / Transfers Assistance Needed: pt reports she needs assist for WC transfers           Hand Dominance        Extremity/Trunk Assessment   Upper Extremity Assessment Upper Extremity Assessment: Defer to OT evaluation    Lower Extremity Assessment Lower Extremity Assessment: Generalized weakness    Cervical / Trunk Assessment Cervical / Trunk Assessment: Kyphotic  Communication   Communication: Expressive difficulties (due to very SOB)  Cognition Arousal/Alertness: Lethargic Behavior During Therapy: WFL for tasks assessed/performed Overall Cognitive Status: No family/caregiver present to determine baseline cognitive functioning  General Comments: pt oriented to self and to location, vague historian, able to follow commands with increased time      General Comments      Exercises General Exercises - Lower Extremity Ankle Circles/Pumps: AROM;Both;5 reps;Supine Heel Slides: AROM;Both;5 reps;AAROM;Supine   Assessment/Plan    PT Assessment Patient needs continued PT services  PT Problem List Decreased mobility;Decreased activity tolerance;Cardiopulmonary status limiting activity       PT Treatment Interventions Therapeutic activities;Functional mobility training;Patient/family education;Therapeutic  exercise;Balance training;Gait training    PT Goals (Current goals can be found in the Care Plan section)  Acute Rehab PT Goals Patient Stated Goal: none stated, pt lethargic, minimally responsive to questions PT Goal Formulation: Patient unable to participate in goal setting Time For Goal Achievement: 07/31/20 Potential to Achieve Goals: Fair    Frequency Min 2X/week   Barriers to discharge        Co-evaluation               AM-PAC PT "6 Clicks" Mobility  Outcome Measure Help needed turning from your back to your side while in a flat bed without using bedrails?: A Lot Help needed moving from lying on your back to sitting on the side of a flat bed without using bedrails?: A Lot Help needed moving to and from a bed to a chair (including a wheelchair)?: A Lot Help needed standing up from a chair using your arms (e.g., wheelchair or bedside chair)?: A Lot Help needed to walk in hospital room?: Total Help needed climbing 3-5 steps with a railing? : Total 6 Click Score: 10    End of Session Equipment Utilized During Treatment: Oxygen Activity Tolerance: Treatment limited secondary to medical complications (Comment) Patient left: in bed;with bed alarm set;with call bell/phone within reach Nurse Communication: Mobility status;Patient requests pain meds PT Visit Diagnosis: Difficulty in walking, not elsewhere classified (R26.2);Muscle weakness (generalized) (M62.81)    Time: 1005-1020 PT Time Calculation (min) (ACUTE ONLY): 15 min   Charges:   PT Evaluation $PT Eval Moderate Complexity: 1 Mod          Tamala Ser PT 07/17/2020  Acute Rehabilitation Services Pager [phone] Office [phone]

## 2020-07-17 NOTE — Progress Notes (Addendum)
CRITICAL VALUE ALERT  Critical Value: Venous Blood gas  pCO2 85.7 PH 7.228  Date & Time Notied: 07/17/2020 11:43  Provider Notified: Yes, Dr. Canary Brim  Orders Received/Actions taken: Place pt on Bi-pap.

## 2020-07-17 NOTE — Progress Notes (Signed)
This RN responded to patients bed alarm to find patient with one foot out of the bed and a home pill bottle in hand. Pt attempted to place bottle in [facility] paper bag with wash cloths in it. This RN confiscated the bottle and asked the patient if she took any of the medications. Pt stated no. This RN explained to the patient that she could not take any home medications while in the hospital and that the medication would have to be sent down to pharmacy until pt discharges. RN explained that medication would be returned at that time. Pt signed paperwork for pharmacy storage and stated that "you can't go through my stuff" RN explained that she did not go through patients personal belongings but had to confiscate the medication that the patient had out in the room in her hand. Pt placed on BiPAP and rested. RN will explain need to go through belongings to check for other unauthorized medications or need for security to lock up belongings until patient discharges due to the incident.  Oxycodone HCL 15MG  Tabs- 2 tablets in bottle (round white pills with r above bisect and p below on one side and the number 5 on the other) Medication brought to pharmacy to be stored.   Noted that prescription was filled on 07/11/2020 for 120 tablets and only 2 pills in bottle. Pt stated she doesn't like to carry them all with her.

## 2020-07-18 DIAGNOSIS — R52 Pain, unspecified: Secondary | ICD-10-CM

## 2020-07-18 DIAGNOSIS — Z7189 Other specified counseling: Secondary | ICD-10-CM

## 2020-07-18 LAB — BASIC METABOLIC PANEL
Anion gap: 8 (ref 5–15)
BUN: 15 mg/dL (ref 8–23)
CO2: 31 mmol/L (ref 22–32)
Calcium: 8.3 mg/dL — ABNORMAL LOW (ref 8.9–10.3)
Chloride: 92 mmol/L — ABNORMAL LOW (ref 98–111)
Creatinine, Ser: 0.65 mg/dL (ref 0.44–1.00)
GFR, Estimated: >60 mL/min (ref >60)
Glucose, Bld: 94 mg/dL (ref 70–99)
Potassium: 4.8 mmol/L (ref 3.5–5.1)
Sodium: 131 mmol/L — ABNORMAL LOW (ref 135–145)

## 2020-07-18 LAB — CBC
HCT: 32.4 % — ABNORMAL LOW (ref 36.0–46.0)
Hemoglobin: 10.3 g/dL — ABNORMAL LOW (ref 12.0–15.0)
MCH: 29.6 pg (ref 26.0–34.0)
MCHC: 31.8 g/dL (ref 30.0–36.0)
MCV: 93.1 fL (ref 80.0–100.0)
Platelets: 223 10*3/uL (ref 150–400)
RBC: 3.48 MIL/uL — ABNORMAL LOW (ref 3.87–5.11)
RDW: 12.8 % (ref 11.5–15.5)
WBC: 13.1 10*3/uL — ABNORMAL HIGH (ref 4.0–10.5)
nRBC: 0 % (ref 0.0–0.2)

## 2020-07-18 LAB — GLUCOSE, CAPILLARY
Glucose-Capillary: 83 mg/dL (ref 70–99)
Glucose-Capillary: 134 mg/dL — ABNORMAL HIGH (ref 70–99)
Glucose-Capillary: 549 mg/dL (ref 70–99)
Glucose-Capillary: 146 mg/dL — ABNORMAL HIGH (ref 70–99)
Glucose-Capillary: 111 mg/dL — ABNORMAL HIGH (ref 70–99)
Glucose-Capillary: 146 mg/dL — ABNORMAL HIGH (ref 70–99)
Glucose-Capillary: 151 mg/dL — ABNORMAL HIGH (ref 70–99)
Glucose-Capillary: 182 mg/dL — ABNORMAL HIGH (ref 70–99)
Glucose-Capillary: 156 mg/dL — ABNORMAL HIGH (ref 70–99)
Glucose-Capillary: 108 mg/dL — ABNORMAL HIGH (ref 70–99)

## 2020-07-18 MED ORDER — MAGNESIUM SULFATE 2 GM/50ML IV SOLN
2.0000 g | Freq: Once | INTRAVENOUS | Status: AC
  Administered 2020-07-18: 2 g via INTRAVENOUS
  Filled 2020-07-18: qty 50

## 2020-07-18 MED ORDER — GUAIFENESIN ER 600 MG PO TB12
600.0000 mg | ORAL_TABLET | Freq: Two times a day (BID) | ORAL | Status: DC
  Administered 2020-07-18 – 2020-07-19 (×3): 600 mg via ORAL
  Filled 2020-07-18 (×3): qty 1

## 2020-07-18 MED ORDER — OXYCODONE HCL 5 MG PO TABS
15.0000 mg | ORAL_TABLET | Freq: Four times a day (QID) | ORAL | Status: DC | PRN
  Administered 2020-07-18 – 2020-07-19 (×5): 15 mg via ORAL
  Filled 2020-07-18 (×5): qty 3

## 2020-07-18 MED ORDER — METOPROLOL TARTRATE 5 MG/5ML IV SOLN
5.0000 mg | INTRAVENOUS | Status: DC | PRN
  Administered 2020-07-18 (×2): 5 mg via INTRAVENOUS
  Filled 2020-07-18 (×2): qty 5

## 2020-07-18 MED ORDER — LISINOPRIL 10 MG PO TABS
20.0000 mg | ORAL_TABLET | Freq: Every day | ORAL | Status: DC
  Administered 2020-07-18 – 2020-07-19 (×2): 20 mg via ORAL
  Filled 2020-07-18 (×2): qty 2

## 2020-07-18 MED ORDER — AMLODIPINE BESYLATE 5 MG PO TABS
5.0000 mg | ORAL_TABLET | Freq: Every day | ORAL | Status: DC
  Administered 2020-07-18 – 2020-07-19 (×2): 5 mg via ORAL
  Filled 2020-07-18 (×2): qty 1

## 2020-07-18 MED ORDER — IPRATROPIUM-ALBUTEROL 0.5-2.5 (3) MG/3ML IN SOLN: 3.0000 mL | RESPIRATORY_TRACT | Status: DC | PRN

## 2020-07-18 NOTE — Progress Notes (Signed)
Chaplain engaged in initial visit with Megan Soto and her sister.  Megan Soto shared that she had recently received "bad news" from her doctor.  Megan Soto expressed that she found out that she is dying.  Chaplain offered support.  She let chaplain know that it wasn't going to be soon but that she knows that she is going to die.  Chaplain and Megan Soto discussed what this time left means for her.  She stated that in the time she has left that she would like to let her mom know how much she loves and cares for her.  She wants her time to be centered around caring for her mom who she described as doing so much for her and her siblings, as well as going through so much with their dad.  Chaplain affirmed the care that she wishes to give to those she loves.    Since hearing from the doctor, Megan Soto noted that she has been having a number of flashbacks.  She has been thinking about her upbringing and different memories that she has.  Megan Soto was dozing off during visit and chaplain affirmed her need to rest.  She wanted to pickup on visit another time.  Chaplain will continue to follow-up and offer support.    07/18/20 1500  Clinical Encounter Type  Visited With Patient and family together  Visit Type Initial  Spiritual Encounters  Spiritual Needs Grief support;Emotional

## 2020-07-18 NOTE — Progress Notes (Signed)
Pt converted into SVT, sustained in 140s. Dr Roda Shutters notified, EKG obtained, 5mg  metoprolol given, magnesium in process of being replaced. Will continue to monitor

## 2020-07-18 NOTE — Progress Notes (Signed)
Patient woke up confused and pulled own IV. Verified with [facility] [place] and order received to DC IV, as no IV meds are scheduled. Patient appears remorseful and states understanding of compliance with medical devices.

## 2020-07-18 NOTE — Progress Notes (Signed)
Patient and sister educated and reminded throughout the day that smoking while hospitalized was not an option. Nurse tech advised this RN that sister had left with mentions to patient about getting cigarettes from the store. Charge RN notified upon return of sister. Witnessed patient return cigarettes to her sister. Patient and family re-educated on danger of smoking while wearing oxygen. Room to be searched after sister and daughter have left to ensure cigarettes were not left with patient.

## 2020-07-18 NOTE — Progress Notes (Signed)
PROGRESS NOTE    Megan Soto  EXH:[id number] DOB: 02-Aug-1961 DOA: 07/15/2020 PCP: Megan Poling, DO    Chief Complaint  Patient presents with  . Shortness of Breath    Brief Narrative:   Megan Soto is a 59 year old woman with a history of severe COPD and chronic hypoxic respiratory failure on 2 L home oxygen who presents via EMS due to shortness of breath.  She says that she has been feeling more short of breath for the last month and half.  She has been on antibiotics (amoxicillin; had a rash after starting doxycycline) and prednisone in November.  She reports that she has been compliant with her Breztri.  She continues to smoke 1.5 packs/day, but plans on today to be her quit date.  At her most recent office follow-up in mid November she was noted to be on a progressive decline with her disease.  Fev1 1.2 L (49% of predicted) in 2018  Subjective:  She agreed to wear bipap last night and able to  tolerated it She continue to feel sob, reports chronic pain  She developed short run of svt this am terminated by iv lopressor   Sister at bedside with permission  Assessment & Plan:   Active Problems:   Acute exacerbation of chronic obstructive pulmonary disease (COPD) (HCC)   Acute on chronic hypercapnia and hypoxic respiratory failure due to COPD exacerbation with ongoing tobacco use -Initial ABG pH 7.19, PCO2 81.4, PO2 69.3 -Due to high risk of needing intubation, she was admitted to ICU, she was put on BiPAP -She was initially treated with Rocephin and Zithromax, Rocephin discontinued after procalcitonin less than 0.1 -She is currently on Zithromax, steroid, nebulizer -Titrate FiO2 to maintain SPO2 88 to 92%.  Avoid hyperoxia. --Palliative care medicine consult by critical care due to  end-stage COPD ,clarify short and long-term goals of care.  short run of svt this am terminated by iv lopressor  Leukocytosis, likely due to steroid, monitor  Hyponatremia Sodium nadir at  127, improved today 131  Hypertension Blood pressure elevated, resume home medication Norvasc and lisinopril  Noninsulin-dependent type 2 diabetes -Controlled -Home medication Metformin held -On sliding scale here, has not needed much  History of hep C cirrhosis  Normocytic anemia  Smoking cessation education provided  FTT: ongoing weight loss, severe malnutrition Body mass index is 15.04 kg/m.  Poor prognosis, continue goals of care discussion    DVT prophylaxis: enoxaparin (LOVENOX) injection 30 mg Start: 07/16/20 0930 SCDs Start: 07/15/20 1738   Code Status: Full Family Communication: Patient and his sister at bedside Disposition:   Status is: Inpatient  Dispo: The patient is from: Home              Anticipated d/c is to: TBD              Anticipated d/c date is:               Patient currently not medically ready for discharge  Consultants:   Critical care  Palliative care  Procedures:   BiPAP  Antimicrobials:   Rocephin and Zithromax     Objective: Vitals:   07/18/20 0400 07/18/20 0500 07/18/20 0600 07/18/20 0700  BP: (!) 146/95 (!) 157/97 (!) 169/97 (!) 148/107  Pulse: 92 85 86 85  Resp: 19 19 (!) 21 (!) 27  Temp: (P) 98.2 F (36.8 C)     TempSrc: (P) Oral     SpO2: 99% 98% 100% 99%  Weight:  Height:        Intake/Output Summary (Last 24 hours) at 07/18/2020 0800 Last data filed at 07/18/2020 0200 Gross per 24 hour  Intake 840 ml  Output 500 ml  Net 340 ml   Filed Weights   07/15/20 2025 07/16/20 0500 07/17/20 0500  Weight: 33.6 kg 33.6 kg 37.3 kg    Examination:  General exam: Very frail, thin ,anxious Respiratory system: Very diminished with minimal wheezing no rales no rhonchi clear to auscultation.  Visibly short of breath with tachypnea . Cardiovascular system: Tachycardia, No pedal edema. Gastrointestinal system: Abdomen is nondistended, soft and nontender. Normal bowel sounds heard. Central nervous system: Alert  and oriented. No focal neurological deficits. Extremities: Generalized weakness Skin: No rashes, lesions or ulcers Psychiatry: Anxious    Data Reviewed: I have personally reviewed following labs and imaging studies  CBC: Recent Labs  Lab 07/15/20 1247 07/15/20 1302 07/15/20 2044 07/16/20 0308 07/18/20 0329  WBC 12.0*  --  10.0 6.0 13.1*  NEUTROABS 10.3*  --   --   --   --   HGB 10.5* 6.5* 11.5* 10.4* 10.3*  HCT 32.9* 19.0* 35.8* 32.5* 32.4*  MCV 92.9  --  93.2 93.9 93.1  PLT 267  --  241 185 223    Basic Metabolic Panel: Recent Labs  Lab 07/15/20 1247 07/15/20 1302 07/16/20 0308 07/16/20 1237 07/17/20 0247 07/18/20 0329  NA 131* 141 128* 127* 129* 131*  K 3.7 2.1* 5.6* 4.9 5.0 4.8  CL 93* 108 93* 91* 92* 92*  CO2 29  --  27 29 31 31   GLUCOSE 128* 78 119* 112* 97 94  BUN 10 5* 15 17 15 15   CREATININE 0.74 0.30* 0.86 0.85 0.77 0.65  CALCIUM 7.4*  --  8.1* 8.3* 8.2* 8.3*  MG  --   --  1.8  --   --   --   PHOS  --   --  3.2  --   --   --     GFR: Estimated Creatinine Clearance: 43.5 mL/min (by C-G formula based on SCr of 0.65 mg/dL).  Liver Function Tests: Recent Labs  Lab 07/15/20 1247  AST 27  ALT 30  ALKPHOS 51  BILITOT 0.7  PROT 5.7*  ALBUMIN 3.2*    CBG: Recent Labs  Lab 07/17/20 1232 07/17/20 1555 07/17/20 2110 07/17/20 2334 07/18/20 0353  GLUCAP 219* 177* 145* 203* 83     Recent Results (from the past 240 hour(s))  Resp Panel by RT-PCR (Flu A&B, Covid) Nasopharyngeal Swab     Status: None   Collection Time: 07/15/20 12:40 PM   Specimen: Nasopharyngeal Swab; Nasopharyngeal(NP) swabs in vial transport medium  Result Value Ref Range Status   SARS Coronavirus 2 by RT PCR NEGATIVE NEGATIVE Final    Comment: (NOTE) SARS-CoV-2 target nucleic acids are NOT DETECTED.  The SARS-CoV-2 RNA is generally detectable in upper respiratory specimens during the acute phase of infection. The lowest concentration of SARS-CoV-2 viral copies this assay  can detect is 138 copies/mL. A negative result does not preclude SARS-Cov-2 infection and should not be used as the sole basis for treatment or other patient management decisions. A negative result may occur with  improper specimen collection/handling, submission of specimen other than nasopharyngeal swab, presence of viral mutation(s) within the areas targeted by this assay, and inadequate number of viral copies(<138 copies/mL). A negative result must be combined with clinical observations, patient history, and epidemiological information. The expected result is Negative.  Fact  Sheet for Patients:  BloggerCourse.comhttps://www.fda.gov/media/152166/download  Fact Sheet for Healthcare Providers:  SeriousBroker.ithttps://www.fda.gov/media/152162/download  This test is no t yet approved or cleared by the [place] FDA and  has been authorized for detection and/or diagnosis of SARS-CoV-2 by FDA under an Emergency Use Authorization (EUA). This EUA will remain  in effect (meaning this test can be used) for the duration of the COVID-19 declaration under Section 564(b)(1) of the Act, 21 U.S.C.section 360bbb-3(b)(1), unless the authorization is terminated  or revoked sooner.       Influenza A by PCR NEGATIVE NEGATIVE Final   Influenza B by PCR NEGATIVE NEGATIVE Final    Comment: (NOTE) The Xpert Xpress SARS-CoV-2/FLU/RSV plus assay is intended as an aid in the diagnosis of influenza from Nasopharyngeal swab specimens and should not be used as a sole basis for treatment. Nasal washings and aspirates are unacceptable for Xpert Xpress SARS-CoV-2/FLU/RSV testing.  Fact Sheet for Patients: BloggerCourse.comhttps://www.fda.gov/media/152166/download  Fact Sheet for Healthcare Providers: SeriousBroker.ithttps://www.fda.gov/media/152162/download  This test is not yet approved or cleared by the [place] FDA and has been authorized for detection and/or diagnosis of SARS-CoV-2 by FDA under an Emergency Use Authorization (EUA). This EUA will  remain in effect (meaning this test can be used) for the duration of the COVID-19 declaration under Section 564(b)(1) of the Act, 21 U.S.C. section 360bbb-3(b)(1), unless the authorization is terminated or revoked.  Performed at [facility], [address] [place], [place] [address]   MRSA PCR Screening     Status: Abnormal   Collection Time: 07/15/20  8:15 PM   Specimen: Nasopharyngeal  Result Value Ref Range Status   MRSA by PCR POSITIVE (A) NEGATIVE Final    Comment:        The GeneXpert MRSA Assay (FDA approved for NASAL specimens only), is one component of a comprehensive MRSA colonization surveillance program. It is not intended to diagnose MRSA infection nor to guide or monitor treatment for MRSA infections. CRITICAL RESULT CALLED TO, READ BACK BY AND VERIFIED WITH: RN TORI AT 2145 07/15/20 CRUICKSHANK A Performed at [facility], [address] [place], [place] [address]          Radiology Studies: No results found.      Scheduled Meds: . (feeding supplement) PROSource Plus  30 mL Oral Daily  . arformoterol  15 mcg Nebulization BID  . azithromycin  250 mg Oral Daily  . budesonide (PULMICORT) nebulizer solution  0.5 mg Nebulization BID  . Chlorhexidine Gluconate Cloth  6 each Topical Daily  . enoxaparin (LOVENOX) injection  30 mg Subcutaneous Q24H  . feeding supplement  237 mL Oral BID BM  . insulin aspart  1-3 Units Subcutaneous Q4H  . ipratropium-albuterol  3 mL Nebulization Q6H  . mouth rinse  15 mL Mouth Rinse BID  . mupirocin ointment  1 application Nasal BID  . nicotine  21 mg Transdermal Q24H  . pantoprazole  40 mg Oral Daily  . predniSONE  40 mg Oral Q breakfast   Continuous Infusions:   LOS: 3 days   Time spent: 35 mins, case discussed with palliative care and critical care Greater than 50% of this time was spent in counseling, explanation of diagnosis, planning of further management, and  coordination of care.  I have personally reviewed and interpreted on  07/18/2020 daily labs, tele strips, imagings as discussed above under date review session and assessment and plans.  I reviewed all nursing notes, pharmacy notes, consultant notes,  vitals, pertinent old  records  I have discussed plan of care as described above with RN , patient and family on 07/18/2020  Voice Recognition /Dragon dictation system was used to create this note, attempts have been made to correct errors. Please contact the author with questions and/or clarifications.   Albertine Grates, MD PhD FACP [facility]  Available via Epic secure chat 7am-7pm for nonurgent issues Please page for urgent issues To page the attending provider between 7A-7P or the covering provider during after hours 7P-7A, please log into the web site [email or web address] and access using universal [place] password for that web site. If you do not have the password, please call the hospital operator.    07/18/2020, 8:00 AM

## 2020-07-18 NOTE — Progress Notes (Signed)
NAME:  Megan Soto, MRN:  [id number], DOB:  [birth year], LOS: 3 ADMISSION DATE:  07/15/2020, CONSULTATION DATE:  07/15/20 REFERRING MD:  Kizzie Bane, CHIEF COMPLAINT:  hypercapnia   Brief History   Acute COPD exacerbation, on home O2, still smoking.  History of present illness   Ms. Megan Soto is a 59 year old woman with a history of severe COPD and chronic hypoxic respiratory failure on 2 L home oxygen who presents via EMS due to shortness of breath.  She says that she has been feeling more short of breath for the last month and half.  She has been on antibiotics (amoxicillin; had a rash after starting doxycycline) and prednisone in November.  She reports that she has been compliant with her Breztri.  She continues to smoke 1.5 packs/day, but plans on today to be her quit date.  At her most recent office follow-up in mid November she was noted to be on a progressive decline with her disease.  FEV1 1.2 L (49% of predicted) in 2018.  Past Medical History  COPD on 2L O2, ongoing tobacco Diabetes Fibromyalgia Hepatitis C Hypertension  Significant Hospital Events   12/07 Admit with COPD exacerbation  12/10 Improved respiratory status, off bipap, O2 to 4L [place]  Consults:    Procedures:    Significant Diagnostic Tests:    Micro Data:  COVID 12/7 >> negative Resp culture 12/7 >>  Antimicrobials:  Azithromycin 12/7 > Ceftriaxone 12/7 >>12/8  Interim history/subjective:  Pt asking why she needs to wear bipap, asking for home dose of narcotics back Afebrile  Tolerating PO's   Objective   Blood pressure (!) 158/105, pulse 85, temperature 97.8 F (36.6 C), temperature source Oral, resp. rate (!) 27, height 5\' 2"  (1.575 m), weight 37.3 kg, SpO2 100 %.    FiO2 (%):  [30 %] 30 %   Intake/Output Summary (Last 24 hours) at 07/18/2020 1114 Last data filed at 07/18/2020 0200 Gross per 24 hour  Intake 720 ml  Output 500 ml  Net 220 ml   Filed Weights   07/15/20 2025 07/16/20 0500 07/17/20  0500  Weight: 33.6 kg 33.6 kg 37.3 kg    Examination: General: chronically ill appearing adult female lying in bed in NAD HEENT: MM pink/moist, anicteric Neuro: AAOx4, speech clear, MAE  CV: s1s2 RRR, no m/r/g PULM: non-labored on 4L (baseline 3L), lungs bilaterally diminished with diffuse wheezing  GI: soft, bsx4 active  Extremities: warm/dry, no edema  Skin: no rashes or lesions  Resolved Hospital Problem list   Hypo and hyperkalemia  Assessment & Plan:   Acute on chronic hypoxic and hypercapnic respiratory failure due to acute COPD exacerbation. -wean O2 for sats 88-94%, nearing baseline. Avoid hyperoxia.  -BiPAP PRN for comfort  -continue azithromycin, D4/5  -prednisone 40 mg PO QD x5 days  -tobacco cessation encouraged  -continue Pulmicort + Brovana for now > may be nearing point in disease state that she needs nebs at home due to decreased respiratory effort / ability to adequately inhale medications  -change scheduled duoneb to Q4 PRN -caution with sedating medications, reviewed this with patient -assess ambulatory O2 needs prior to discharge to ensure adequate home regimen -outpatient pulmonary follow up arranged, see discharge section  Tobacco abuse  -nicotine patch QD  -cessation counseling   Hyponatremia Concern for poor p.o. intake chronically -diet as tolerated   Severe protein calorie malnutrition Likely due to chronic respiratory failure -diet as tolerated  -encourage PO intake / feeding supplement    Acute  anemia  Hgb stable  -follow CBC  -consider work up as outpatient   Hyperglycemia CBG range 83-134  -follow glucose, SSI with steroids   Chronic pain  -resume PTA oxycodone dosing and monitor with caution   GOC -appreciate palliative care evaluation  -pt likely needs long term outpatient follow up given severe COPD, anxiety   Best practice (evaluated daily)  Diet: NPO Pain/Anxiety/Delirium protocol (if indicated): n/a VAP protocol (if  indicated): n/a DVT prophylaxis: enoxaparin GI prophylaxis: n/a Glucose control: SSI Mobility: progressive last date of multidisciplinary goals of care discussion: per primary  Family and staff present  Summary of discussion  Follow up goals of care discussion due Code Status: full Disposition: SD  PCCM will sign off.  Please call back if new needs arise.    Labs   CBC: Recent Labs  Lab 07/15/20 1247 07/15/20 1302 07/15/20 2044 07/16/20 0308 07/18/20 0329  WBC 12.0*  --  10.0 6.0 13.1*  NEUTROABS 10.3*  --   --   --   --   HGB 10.5* 6.5* 11.5* 10.4* 10.3*  HCT 32.9* 19.0* 35.8* 32.5* 32.4*  MCV 92.9  --  93.2 93.9 93.1  PLT 267  --  241 185 223    Basic Metabolic Panel: Recent Labs  Lab 07/15/20 1247 07/15/20 1302 07/16/20 0308 07/16/20 1237 07/17/20 0247 07/18/20 0329  NA 131* 141 128* 127* 129* 131*  K 3.7 2.1* 5.6* 4.9 5.0 4.8  CL 93* 108 93* 91* 92* 92*  CO2 29  --  27 29 31 31   GLUCOSE 128* 78 119* 112* 97 94  BUN 10 5* 15 17 15 15   CREATININE 0.74 0.30* 0.86 0.85 0.77 0.65  CALCIUM 7.4*  --  8.1* 8.3* 8.2* 8.3*  MG  --   --  1.8  --   --   --   PHOS  --   --  3.2  --   --   --    GFR: Estimated Creatinine Clearance: 43.5 mL/min (by C-G formula based on SCr of 0.65 mg/dL). Recent Labs  Lab 07/15/20 1247 07/15/20 2044 07/16/20 0308 07/18/20 0329  PROCALCITON  --  <0.10  --   --   WBC 12.0* 10.0 6.0 13.1*    Liver Function Tests: Recent Labs  Lab 07/15/20 1247  AST 27  ALT 30  ALKPHOS 51  BILITOT 0.7  PROT 5.7*  ALBUMIN 3.2*   No results for input(s): LIPASE, AMYLASE in the last 168 hours. No results for input(s): AMMONIA in the last 168 hours.  ABG    Component Value Date/Time   PHART 7.199 (LL) 07/15/2020 1715   PCO2ART 81.4 (HH) 07/15/2020 1715   PO2ART 69.3 (L) 07/15/2020 1715   HCO3 34.5 (H) 07/17/2020 1129   TCO2 20 (L) 07/15/2020 1302   ACIDBASEDEF 0.3 05/27/2010 1618   O2SAT 65.1 07/17/2020 1129     Coagulation  Profile: No results for input(s): INR, PROTIME in the last 168 hours.  Cardiac Enzymes: No results for input(s): CKTOTAL, CKMB, CKMBINDEX, TROPONINI in the last 168 hours.  HbA1C: Hemoglobin A1C  Date/Time Value Ref Range Status  11/22/2017 03:43 PM 5.2  Final   HbA1c, POC (controlled diabetic range)  Date/Time Value Ref Range Status  03/31/2020 02:33 PM 5.5 0.0 - 7.0 % Final   Hgb A1c MFr Bld  Date/Time Value Ref Range Status  05/28/2010 05:24 AM (H) <5.7 % Final   6.0 (NOTE)  According to the ADA Clinical Practice Recommendations for 2011, when HbA1c is used as a screening test:   >=6.5%   Diagnostic of Diabetes Mellitus           (if abnormal result  is confirmed)  5.7-6.4%   Increased risk of developing Diabetes Mellitus  References:Diagnosis and Classification of Diabetes Mellitus,Diabetes Care,2011,34(Suppl 1):S62-S69 and Standards of Medical Care in         Diabetes - 2011,Diabetes Care,2011,34  (Suppl 1):S11-S61.  02/05/2010 04:40 PM (H) <5.7 % Final   6.1 (NOTE)                                                                       According to the ADA Clinical Practice Recommendations for 2011, when HbA1c is used as a screening test:   >=6.5%   Diagnostic of Diabetes Mellitus           (if abnormal result  is confirmed)  5.7-6.4%   Increased risk of developing Diabetes Mellitus  References:Diagnosis and Classification of Diabetes Mellitus,Diabetes Care,2011,34(Suppl 1):S62-S69 and Standards of Medical Care in         Diabetes - 2011,Diabetes Care,2011,34  (Suppl 1):S11-S61.    CBG: Recent Labs  Lab 07/17/20 1555 07/17/20 2110 07/17/20 2334 07/18/20 0353 07/18/20 0818  GLUCAP 177* 145* 203* 83 134*     Canary Brim, MSN, NP-C, AGACNP-BC [facility] 07/18/2020, 11:15 AM   Please see [email or web address] for pager details.

## 2020-07-18 NOTE — Plan of Care (Signed)
I had a meeting with Megan Soto, her sister Megan Soto and Dr. Linna Darner from Palliative Care at bedside, and her sister Megan Soto and daughter Megan Soto via phone. We discussed her end-stage COPD, her recurrent hypercapnia and confusion, her high risk medications that make her high risk for respiratory deterioration but also help promote comfort. She wants to stay in the hospital with full aggressive care one more night, but she understands she has severe disease and MV and CPR would be harmful rather than beneficial in the event of a cardiac arrest. She wants to change her code status to DNR at this time. She wants to be able to go home with hospice but wants to go home as healthy as possible to hopefully have more time. Her sister Megan Soto is a Therapist, music. All 3 family members have been supportive in encouraging [name] to make the decisons that make the most sense for her. They would be able to care for her in a home hospice setting if desired. We will reassess throughout the weekend to determine her wishes. At this point she wants to continue all aggressive care measures but is DNR. Bedside RN to be updated. Code status order changed in epic.  Steffanie Dunn, DO 07/18/20 2:05 PM  Pulmonary & Critical Care

## 2020-07-18 NOTE — Progress Notes (Signed)
Patient agreed to try to wear the bi-pap until morning.

## 2020-07-18 NOTE — NC FL2 (Deleted)
[place] MEDICAID FL2 LEVEL OF CARE SCREENING TOOL     IDENTIFICATION  Patient Name: Megan Soto Birthdate: 04/02/1961 Sex: female Admission Date (Current Location): 07/15/2020  [facility] and [place] Number:  Producer, television/film/video and Address:  [facility],  [address] [place] [address]      Provider Number: [id number]  Attending Physician Name and Address:  Albertine Grates, MD  Relative Name and Phone Number:       Current Level of Care: Hospital Recommended Level of Care: Skilled Nursing Facility Prior Approval Number:    Date Approved/Denied:   PASRR Number: [id number]  Discharge Plan: SNF    Current Diagnoses: Patient Active Problem List   Diagnosis Date Noted  . Acute exacerbation of chronic obstructive pulmonary disease (COPD) (HCC) 07/15/2020  . Depressed mood 03/31/2020  . Family history of colon cancer   . Type 2 diabetes mellitus without complication, without long-term current use of insulin (HCC) 11/28/2017  . Chronic low back pain without sciatica 11/28/2017  . Chronic respiratory failure with hypoxia (HCC) 10/17/2016  . Chronic hepatitis C without hepatic coma (HCC) 08/20/2015  . Liver fibrosis 08/20/2015  . Essential hypertension 03/16/2014  . Anxiety state 04/28/2010  . DM 02/24/2010  . GERD 11/04/2009  . Tobacco abuse 10/21/2008  . INSOMNIA 02/28/2008  . COPD mixed type (HCC) 10/16/2007    Orientation RESPIRATION BLADDER Height & Weight     Self,Time,Situation,Place  Normal Continent Weight: 37.3 kg Height:  5\' 2"  (157.5 cm)  BEHAVIORAL SYMPTOMS/MOOD NEUROLOGICAL BOWEL NUTRITION STATUS      Continent Diet (regular)  AMBULATORY STATUS COMMUNICATION OF NEEDS Skin   Extensive Assist Verbally Normal                       Personal Care Assistance Level of Assistance  Bathing,Feeding,Dressing Bathing Assistance: Limited assistance Feeding assistance: Limited assistance Dressing Assistance: Limited assistance      Functional Limitations Info  Sight,Hearing,Speech Sight Info: Adequate Hearing Info: Adequate Speech Info: Adequate    SPECIAL CARE FACTORS FREQUENCY  PT (By licensed PT),OT (By licensed OT)     PT Frequency: 5 x weekly OT Frequency: 5 x weekly            Contractures Contractures Info: Present    Additional Factors Info  Code Status Code Status Info: full             Current Medications (07/18/2020):  This is the current hospital active medication list Current Facility-Administered Medications  Medication Dose Route Frequency Provider Last Rate Last Admin  . (feeding supplement) PROSource Plus liquid 30 mL  30 mL Oral Daily 14/05/2020 P, DO      . ALPRAZolam Karie Fetch) tablet 1 mg  1 mg Oral TID PRN Prudy Feeler P, DO   1 mg at 07/18/20 0237  . amLODipine (NORVASC) tablet 5 mg  5 mg Oral Daily 14/10/21, MD   5 mg at 07/18/20 0902  . arformoterol (BROVANA) nebulizer solution 15 mcg  15 mcg Nebulization BID 14/10/21 L, NP   15 mcg at 07/18/20 0820  . azithromycin (ZITHROMAX) tablet 250 mg  250 mg Oral Daily 14/10/21 P, DO   250 mg at 07/18/20 0856  . budesonide (PULMICORT) nebulizer solution 0.5 mg  0.5 mg Nebulization BID 14/10/21, Brandi L, NP   0.5 mg at 07/18/20 0820  . Chlorhexidine Gluconate Cloth 2 % PADS 6 each  6 each Topical Daily 14/10/21, DO  6 each at 07/18/20 0903  . docusate sodium (COLACE) capsule 100 mg  100 mg Oral BID PRN Karie Fetch P, DO      . enoxaparin (LOVENOX) injection 30 mg  30 mg Subcutaneous Q24H Karie Fetch P, DO   30 mg at 07/18/20 0856  . feeding supplement (ENSURE ENLIVE / ENSURE PLUS) liquid 237 mL  237 mL Oral BID BM Karie Fetch P, DO   237 mL at 07/18/20 0903  . guaiFENesin (MUCINEX) 12 hr tablet 600 mg  600 mg Oral BID Albertine Grates, MD   600 mg at 07/18/20 0902  . insulin aspart (novoLOG) injection 1-3 Units  1-3 Units Subcutaneous Q4H Steffanie Dunn, DO   1 Units at 07/18/20 0913  . ipratropium-albuterol (DUONEB) 0.5-2.5  (3) MG/3ML nebulizer solution 3 mL  3 mL Nebulization Q6H Ollis, Brandi L, NP   3 mL at 07/18/20 0820  . lisinopril (ZESTRIL) tablet 20 mg  20 mg Oral Daily Albertine Grates, MD   20 mg at 07/18/20 0902  . MEDLINE mouth rinse  15 mL Mouth Rinse BID Karie Fetch P, DO   15 mL at 07/18/20 1025  . mupirocin ointment (BACTROBAN) 2 % 1 application  1 application Nasal BID Steffanie Dunn, DO   1 application at 07/18/20 8527  . nicotine (NICODERM CQ - dosed in mg/24 hours) patch 21 mg  21 mg Transdermal Q24H Karie Fetch P, DO   21 mg at 07/17/20 2218  . ondansetron (ZOFRAN) injection 4 mg  4 mg Intravenous Q6H PRN Karie Fetch P, DO      . oxyCODONE (Oxy IR/ROXICODONE) immediate release tablet 10 mg  10 mg Oral Q6H PRN Karie Fetch P, DO   10 mg at 07/18/20 0856  . pantoprazole (PROTONIX) EC tablet 40 mg  40 mg Oral Daily Karie Fetch P, DO   40 mg at 07/18/20 0856  . polyethylene glycol (MIRALAX / GLYCOLAX) packet 17 g  17 g Oral Daily PRN Karie Fetch P, DO      . predniSONE (DELTASONE) tablet 40 mg  40 mg Oral Q breakfast Karie Fetch P, DO   40 mg at 07/18/20 7824     Discharge Medications: Please see discharge summary for a list of discharge medications.  Relevant Imaging Results:  Relevant Lab Results:   Additional Information MPN:[id number]  Golda Acre, RN

## 2020-07-18 NOTE — Progress Notes (Signed)
This RN came to patient room to take sister downstairs in a wheelchair. Noticed patient was holding cigarette and lighter in her hands. Both were removed and given to the sister. Sister stated she would bring them tomorrow when the patient went home. This RN reiterated that no cigarettes or lighters are to be brought into the hospital, regardless of the patient going home with hospice.  Upon return to the room, this RN informed the patient that her immediate surroundings would be searched for cigarettes. Two scored blue pills with TEVA on one side and 3927 on the other were found in a tissue box on the patient's side table. Search determined that the pills were 10 mgs of diazepam. Security and pharmacy were called to determine what should be done with the pills, as the patient had not been prescribed diazepam during this hospitalization. Both advised that pills be destroyed. Both pills were placed in the stericycle, witnessed by security and Larence Penning, RN. Room was searched by this RN and Larence Penning, RN while witnessed by security. No other contraband was found. Telesitter ordered and placed in room.

## 2020-07-18 NOTE — Progress Notes (Signed)
Daily Progress Note   Patient Name: Megan Soto       Date: 07/18/2020 DOB: Apr 30, 1961  Age: 59 y.o. MRN#: [id number] Attending Physician: Albertine Grates, MD Primary Care Physician: Jackelyn Poling, DO Admit Date: 07/15/2020  Reason for Consultation/Follow-up: Establishing goals of care  Subjective:  events during the course of the day today noted, patient with high CO2, decreasing mentation off/on. Family meeting with patient, sister, family on the phone and PCCM took place, see below.   Length of Stay: 3  Current Medications: Scheduled Meds:  . (feeding supplement) PROSource Plus  30 mL Oral Daily  . amLODipine  5 mg Oral Daily  . arformoterol  15 mcg Nebulization BID  . azithromycin  250 mg Oral Daily  . budesonide (PULMICORT) nebulizer solution  0.5 mg Nebulization BID  . Chlorhexidine Gluconate Cloth  6 each Topical Daily  . enoxaparin (LOVENOX) injection  30 mg Subcutaneous Q24H  . feeding supplement  237 mL Oral BID BM  . guaiFENesin  600 mg Oral BID  . insulin aspart  1-3 Units Subcutaneous Q4H  . lisinopril  20 mg Oral Daily  . mouth rinse  15 mL Mouth Rinse BID  . mupirocin ointment  1 application Nasal BID  . nicotine  21 mg Transdermal Q24H  . pantoprazole  40 mg Oral Daily  . predniSONE  40 mg Oral Q breakfast    Continuous Infusions:   PRN Meds: ALPRAZolam, docusate sodium, ipratropium-albuterol, metoprolol tartrate, ondansetron (ZOFRAN) IV, oxyCODONE, polyethylene glycol  Physical Exam          chronically ill appearing adult female lying in bed in NAD  overall awake, alert and is able to converse, how ever, does get sleepy at times.   CV: s1s2 RRR, no m/r/g Audible wheezes externally, is weak and deconditioned Abdomen not distended Does not have edema Has  nicotine patch.   Vital Signs: BP (!) 148/95   Pulse (!) 117   Temp 97.9 F (36.6 C) (Oral)   Resp (!) 27   Ht 5\' 2"  (1.575 m)   Wt 37.3 kg   SpO2 98%   BMI 15.04 kg/m  SpO2: SpO2: 98 % O2 Device: O2 Device: High Flow Nasal Cannula (salter) O2 Flow Rate: O2 Flow Rate (L/min): 4 L/min  Intake/output summary:   Intake/Output Summary (Last  24 hours) at 07/18/2020 1427 Last data filed at 07/18/2020 1247 Gross per 24 hour  Intake 770 ml  Output 500 ml  Net 270 ml   LBM: Last BM Date: 07/16/20 Baseline Weight: Weight: 33.6 kg Most recent weight: Weight: 37.3 kg      PPS 30% Palliative Assessment/Data:      Patient Active Problem List   Diagnosis Date Noted  . Acute exacerbation of chronic obstructive pulmonary disease (COPD) (HCC) 07/15/2020  . Depressed mood 03/31/2020  . Family history of colon cancer   . Type 2 diabetes mellitus without complication, without long-term current use of insulin (HCC) 11/28/2017  . Chronic low back pain without sciatica 11/28/2017  . Chronic respiratory failure with hypoxia (HCC) 10/17/2016  . Chronic hepatitis C without hepatic coma (HCC) 08/20/2015  . Liver fibrosis 08/20/2015  . Essential hypertension 03/16/2014  . Anxiety state 04/28/2010  . DM 02/24/2010  . GERD 11/04/2009  . Tobacco abuse 10/21/2008  . INSOMNIA 02/28/2008  . COPD mixed type (HCC) 10/16/2007    Palliative Care Assessment & Plan   Patient Profile:  58 year old lady with severe chronic obstructive pulmonary disease and chronic hypoxic respiratory failure, she is on 2 L supplemental oxygen via nasal cannula at home.  She has been admitted to stepdown unit at [facility] in [place], [place] because of shortness of breath.  Off-and-on she has been on antibiotics as well as steroids.  Unfortunately, she has continued to smoke up until very recently.  She also has fibromyalgia, diabetes, hypertension and hepatitis C history.  Assessment: Acute on  chronic hypoxic and hypercapnic respiratory failure COPD exacerbation Tobacco use Functional decline Severe protein calorie malnutrition Chronic pain Wheezing, shortness of breath.  Recommendations/Plan: Family meeting to discuss CODE STATUS and broad goals of care was undertaken.  Risks benefits of patient undergoing full extent of resuscitation was discussed in detail.  Serious life limiting illness of advanced end-stage chronic obstructive pulmonary disease, chronic pain, escalating symptom burden with pain and shortness of breath was discussed in detail.  The differences between aggressive care pathway and comfort care pathway were discussed.  Discussed about hospice approach, discussed about comfort focused care. Plan: DO NOT RESUSCITATE/DO NOT INTUBATE Begin to focus on establishment of comfort measures, symptom management. Try to get the patient home with hospice support over the weekend.   Code Status:    Code Status Orders  (From admission, onward)         Start     Ordered   07/18/20 1400  Do not attempt resuscitation (DNR)  Continuous       Question Answer Comment  In the event of cardiac or respiratory ARREST Do not call a "code blue"   In the event of cardiac or respiratory ARREST Do not perform Intubation, CPR, defibrillation or ACLS   In the event of cardiac or respiratory ARREST Use medication by any route, position, wound care, and other measures to relive pain and suffering. May use oxygen, suction and manual treatment of airway obstruction as needed for comfort.      07/18/20 1359        Code Status History    Date Active Date Inactive Code Status Order ID Comments User Context   07/15/2020 1740 07/18/2020 1359 Full Code [id number]  Steffanie Dunn, DO ED   Advance Care Planning Activity      Prognosis:  < 4 weeks  Discharge Planning: Home with Hospice over the weekend.   Care plan was  discussed with patient, sister Luster Landsberg who is at the bedside, sister  Gunnar Fusi on the phone, patient's daughter Aggie Cosier on the phone.    Thank you for allowing the Palliative Medicine Team to assist in the care of this patient.   Time In: 1330 Time Out: 1405 Total Time 35 Prolonged Time Billed  no       Greater than 50%  of this time was spent counseling and coordinating care related to the above assessment and plan.  Rosalin Hawking, MD  Please contact Palliative Medicine Team phone at [phone] for questions and concerns.

## 2020-07-18 NOTE — TOC Initial Note (Addendum)
Transition of Care [facility]) - Initial/Assessment Note    Patient Details  Name: Megan Soto MRN: [id number] Date of Birth: 1961/01/17  Transition of Care [facility]) CM/SW Contact:    Golda Acre, RN Phone Number: 07/18/2020, 9:17 AM  Clinical Narrative:                 [id number]/[id number] [facility] and [place] pt lives in [place].    59 year old woman with a history of severe COPD and chronic hypoxic respiratory failure on 2 L home oxygen who presents via EMS due to shortness of breath.  She says that she has been feeling more short of breath for the last month and half.  She has been on antibiotics (amoxicillin; had a rash after starting doxycycline) and prednisone in November.  She reports that she has been compliant with her Breztri.  She continues to smoke 1.5 packs/day, but plans on today to be her quit date.  At her most recent office follow-up in mid November she was noted to be on a progressive decline with her disease.  Fev1 1.2 L (49% of predicted) in 2018  Past Medical History  COPD on 2L O2, ongoing tobacco Diabetes Fibromyalgia Hepatitis C Hypertension PLAN: SNF with pallative care will obtain passar and fl2 Following for progression Expected Discharge Plan: Skilled Nursing Facility Barriers to Discharge: No Barriers Identified   Patient Goals and CMS Choice     Choice offered to / list presented to : Patient  Expected Discharge Plan and Services Expected Discharge Plan: Skilled Nursing Facility     Post Acute Care Choice: Skilled Nursing Facility Living arrangements for the past 2 months: Single Family Home                                      Prior Living Arrangements/Services Living arrangements for the past 2 months: Single Family Home Lives with:: Self Patient language and need for interpreter reviewed:: Yes Do you feel safe going back to the place where you live?: Yes      Need for Family Participation in Patient  Care: Yes (Comment) Care giver support system in place?: Yes (comment)   Criminal Activity/Legal Involvement Pertinent to Current Situation/Hospitalization: No - Comment as needed  Activities of Daily Living Home Assistive Devices/Equipment: Wheelchair,Oxygen,Nebulizer,Eyeglasses ADL Screening (condition at time of admission) Patient's cognitive ability adequate to safely complete daily activities?: Yes Is the patient deaf or have difficulty hearing?: No Does the patient have difficulty seeing, even when wearing glasses/contacts?: No Does the patient have difficulty concentrating, remembering, or making decisions?: No Patient able to express need for assistance with ADLs?: Yes Does the patient have difficulty dressing or bathing?: Yes Independently performs ADLs?: No Communication: Independent Dressing (OT): Independent Grooming: Independent Feeding: Independent Bathing: Needs assistance Is this a change from baseline?: Pre-admission baseline Toileting: Needs assistance Is this a change from baseline?: Pre-admission baseline In/Out Bed: Needs assistance Is this a change from baseline?: Pre-admission baseline Walks in Home: Needs assistance Is this a change from baseline?: Pre-admission baseline Does the patient have difficulty walking or climbing stairs?: Yes Weakness of Legs: Both Weakness of Arms/Hands: Both  Permission Sought/Granted                  Emotional Assessment Appearance:: Appears stated age Attitude/Demeanor/Rapport: Apprehensive Affect (typically observed): Agitated Orientation: : Oriented to Place,Oriented to Self,Oriented to  Time,Oriented  to Situation Alcohol / Substance Use: Not Applicable Psych Involvement: No (comment)  Admission diagnosis:  Respiratory distress [R06.03] Acute exacerbation of chronic obstructive pulmonary disease (COPD) (HCC) [J44.1] Hypoxia [R09.02] COPD exacerbation (HCC) [J44.1] Patient Active Problem List   Diagnosis Date  Noted  . Acute exacerbation of chronic obstructive pulmonary disease (COPD) (HCC) 07/15/2020  . Depressed mood 03/31/2020  . Family history of colon cancer   . Type 2 diabetes mellitus without complication, without long-term current use of insulin (HCC) 11/28/2017  . Chronic low back pain without sciatica 11/28/2017  . Chronic respiratory failure with hypoxia (HCC) 10/17/2016  . Chronic hepatitis C without hepatic coma (HCC) 08/20/2015  . Liver fibrosis 08/20/2015  . Essential hypertension 03/16/2014  . Anxiety state 04/28/2010  . DM 02/24/2010  . GERD 11/04/2009  . Tobacco abuse 10/21/2008  . INSOMNIA 02/28/2008  . COPD mixed type (HCC) 10/16/2007   PCP:  Jackelyn Poling, DO Pharmacy:   [facility] - [place], [place] - [phone], [address] [place] [address] Phone: [phone] Fax: [phone]  [facility] - [place], [place] - [address] Marye Round [place] [address] Phone: [phone] Fax: [phone]     Social Determinants of Health (SDOH) Interventions    Readmission Risk Interventions No flowsheet data found.

## 2020-07-19 DIAGNOSIS — Z7189 Other specified counseling: Secondary | ICD-10-CM

## 2020-07-19 DIAGNOSIS — R627 Adult failure to thrive: Secondary | ICD-10-CM

## 2020-07-19 DIAGNOSIS — Z66 Do not resuscitate: Secondary | ICD-10-CM

## 2020-07-19 DIAGNOSIS — E119 Type 2 diabetes mellitus without complications: Secondary | ICD-10-CM

## 2020-07-19 DIAGNOSIS — R0602 Shortness of breath: Secondary | ICD-10-CM

## 2020-07-19 DIAGNOSIS — I471 Supraventricular tachycardia: Secondary | ICD-10-CM

## 2020-07-19 DIAGNOSIS — Z515 Encounter for palliative care: Secondary | ICD-10-CM

## 2020-07-19 LAB — BASIC METABOLIC PANEL
Anion gap: 7 (ref 5–15)
BUN: 11 mg/dL (ref 8–23)
CO2: 34 mmol/L — ABNORMAL HIGH (ref 22–32)
Calcium: 8.1 mg/dL — ABNORMAL LOW (ref 8.9–10.3)
Chloride: 93 mmol/L — ABNORMAL LOW (ref 98–111)
Creatinine, Ser: 0.69 mg/dL (ref 0.44–1.00)
GFR, Estimated: >60 mL/min (ref >60)
Glucose, Bld: 114 mg/dL — ABNORMAL HIGH (ref 70–99)
Potassium: 4.2 mmol/L (ref 3.5–5.1)
Sodium: 134 mmol/L — ABNORMAL LOW (ref 135–145)

## 2020-07-19 LAB — CBC WITH DIFFERENTIAL/PLATELET
Abs Immature Granulocytes: 0.05 10*3/uL (ref 0.00–0.07)
Basophils Absolute: 0 10*3/uL (ref 0.0–0.1)
Basophils Relative: 0 %
Eosinophils Absolute: 0 10*3/uL (ref 0.0–0.5)
Eosinophils Relative: 0 %
HCT: 35.1 % — ABNORMAL LOW (ref 36.0–46.0)
Hemoglobin: 11.1 g/dL — ABNORMAL LOW (ref 12.0–15.0)
Immature Granulocytes: 0 %
Lymphocytes Relative: 8 %
Lymphs Abs: 0.9 10*3/uL (ref 0.7–4.0)
MCH: 29.4 pg (ref 26.0–34.0)
MCHC: 31.6 g/dL (ref 30.0–36.0)
MCV: 93.1 fL (ref 80.0–100.0)
Monocytes Absolute: 0.9 10*3/uL (ref 0.1–1.0)
Monocytes Relative: 8 %
Neutro Abs: 9.8 10*3/uL — ABNORMAL HIGH (ref 1.7–7.7)
Neutrophils Relative %: 84 %
Platelets: 231 10*3/uL (ref 150–400)
RBC: 3.77 MIL/uL — ABNORMAL LOW (ref 3.87–5.11)
RDW: 12.9 % (ref 11.5–15.5)
WBC: 11.6 10*3/uL — ABNORMAL HIGH (ref 4.0–10.5)
nRBC: 0 % (ref 0.0–0.2)

## 2020-07-19 LAB — MAGNESIUM: Magnesium: 1.9 mg/dL (ref 1.7–2.4)

## 2020-07-19 LAB — GLUCOSE, CAPILLARY: Glucose-Capillary: 109 mg/dL — ABNORMAL HIGH (ref 70–99)

## 2020-07-19 MED ORDER — OXYCODONE HCL 15 MG PO TABS: 15.0000 mg | ORAL_TABLET | Freq: Four times a day (QID) | ORAL | 0 refills | Status: DC | PRN

## 2020-07-19 MED ORDER — ALPRAZOLAM 1 MG PO TABS: 1.0000 mg | ORAL_TABLET | Freq: Three times a day (TID) | ORAL | 0 refills | Status: AC | PRN

## 2020-07-19 MED ORDER — DILTIAZEM HCL 60 MG PO TABS: 60.0000 mg | ORAL_TABLET | Freq: Three times a day (TID) | ORAL | 0 refills | Status: AC

## 2020-07-19 MED ORDER — ALPRAZOLAM 1 MG PO TABS: 1.0000 mg | ORAL_TABLET | Freq: Three times a day (TID) | ORAL | 0 refills | Status: DC | PRN

## 2020-07-19 MED ORDER — OXYCODONE HCL 15 MG PO TABS: 15.0000 mg | ORAL_TABLET | Freq: Four times a day (QID) | ORAL | 0 refills | Status: AC | PRN

## 2020-07-19 MED ORDER — CYCLOBENZAPRINE HCL 10 MG PO TABS: 10.0000 mg | ORAL_TABLET | Freq: Three times a day (TID) | ORAL | 0 refills | Status: AC | PRN

## 2020-07-19 NOTE — Progress Notes (Signed)
Awaiting transport home via PTAR. Family at bedside. Explanation given about time until transport arrives. Family states frustration at the time they have been waiting to discharge. Comfort and emotional support provided.

## 2020-07-19 NOTE — TOC Progression Note (Signed)
Transition of Care [facility]) - Progression Note    Patient Details  Name: Megan Soto MRN: [id number] Date of Birth: 07-05-1961  Transition of Care [facility]) CM/SW Contact  Armanda Heritage, RN Phone Number: 07/19/2020, 2:27 PM  Clinical Narrative:    CM received notification that patient will need hospice services at home and possibly equipment.  CM reached out to patient who states she needs to speak with her daughter, CM spoke with patient about needed to have that conversation as soon as possible for discharge planning.    CM followed up with patient who states she has not spoken with her daughter.  CM received permission to speak with daughter and contacted her.  Daughter reports patient lives with her and they do want hospice services.  Daughter reports patient's sister works for hospice and she believes was making arrangements with her agency.  CM given permission to contact sister Gunnar Fusi to confirm which agency and if any arrangements have been made.  CM called Gunnar Fusi x2 and left voice mail with no response.  At this time cannot confirm which Hospice agency family wishes to use and if they have reached out to anyone.   Expected Discharge Plan: Home w Hospice Care Barriers to Discharge: No Barriers Identified  Expected Discharge Plan and Services Expected Discharge Plan: Home w Hospice Care     Post Acute Care Choice: Skilled Nursing Facility Living arrangements for the past 2 months: Single Family Home Expected Discharge Date: 07/19/20                                     Social Determinants of Health (SDOH) Interventions    Readmission Risk Interventions No flowsheet data found.

## 2020-07-19 NOTE — Plan of Care (Signed)
Mother and 2 sisters at bedside with Ms. Lanum waiting for her to get home. They are planning on going home with hospice. They do not think she needs a [place] MOST form since they will be with her and have no intention to calling EMS; they want hospice to assume her care at home. CM and SW working on discharge planning currenty.  Steffanie Dunn, DO 07/19/20 3:23 PM [facility]

## 2020-07-19 NOTE — Progress Notes (Signed)
Patient notified again that smoking while hospitalized is not possible. Patient states understanding. Repeatedly states "I want to go home."

## 2020-07-19 NOTE — Progress Notes (Signed)
Patient waiting on PTAR for discharge transportation to home. After visit summary reviewed with patient and sister utilizing teach back method no questions at this time. Family verbalized that they will go home and get patients home oxygen and pick up her prescriptions and that they will take her home if ambulance is not here when they  return. Updated  Night shift charge nurse on family wishes. Patient's home medications given to  [employer], Scientific laboratory technician).

## 2020-07-19 NOTE — Progress Notes (Signed)
Civil engineer, contracting [facility])  Received request from [facility] for hospice services at home after discharge.  Chart and pt information under review by [facility] physician.  Hospice eligibility pending at this time.  Hospital liaison spoke with Gunnar Fusi, sister, to initiate education related to hospice philosophy and services and to answer any questions at this time.  Gunnar Fusi  verbalized understanding of information given.  Per discussion the plan is to discharge home today by PTAR.    Pease send signed and completed DNR home with pt/family.  Please provide prescriptions at discharge as needed to ensure ongoing symptom management.    DME needs discussed. Family denies any DME needs at this time. Address has been verified and is correct in the chart.  [facility] information and contact numbers given to [place]  Above information shared with Gildardo Cranker Manager.  Please call with any questions or concerns.  Thank you for the opportunity to participate in this pt's care.  Gillian Scarce, BSN, RN [employer] [phone]

## 2020-07-19 NOTE — Discharge Summary (Signed)
Discharge Summary  Lamonte SakaiRobin G Brodt JYN:[id number] DOB: 03-01-1961  PCP: Jackelyn PolingWelborn, Ryan, DO  Admit date: 07/15/2020 Discharge date: 07/19/2020  Time spent: 45mins, more than 50% time spent on coordination of care.  Case discussed with palliative care and critical care  Recommendations for Outpatient Follow-up:  1. F/u with home hospice   Discharge Diagnoses:  Active Hospital Problems   Diagnosis Date Noted  . Acute exacerbation of chronic obstructive pulmonary disease (COPD) (HCC) 07/15/2020    Resolved Hospital Problems  No resolved problems to display.    Discharge Condition: stable  Diet recommendation: regular diet  Filed Weights   07/15/20 2025 07/16/20 0500 07/17/20 0500  Weight: 33.6 kg 33.6 kg 37.3 kg    History of present illness: ( per ICU admitting MD Dr Chestine Sporelark) Ms. Megan Soto is a 59 year old woman with a history of severe COPD and chronic hypoxic respiratory failure on 2 L home oxygen who presents via EMS due to shortness of breath.  She says that she has been feeling more short of breath for the last month and half.  She has been on antibiotics (amoxicillin; had a rash after starting doxycycline) and prednisone in November.  She reports that she has been compliant with her Breztri.  She continues to smoke 1.5 packs/day, but plans on today to be her quit date.  At her most recent office follow-up in mid November she was noted to be on a progressive decline with her disease.  Fev1 1.2 L (49% of predicted) in 2018  Hospital Course:  Active Problems:   Acute exacerbation of chronic obstructive pulmonary disease (COPD) (HCC)  Acute on chronic hypercapnia and hypoxic respiratory failure due to COPD exacerbation with ongoing tobacco use -Initial ABG pH 7.19, PCO2 81.4, PO2 69.3 -Due to high risk of needing intubation, she was admitted to ICU, she was put on BiPAP -She was initially treated with Rocephin and Zithromax, Rocephin discontinued after procalcitonin less than  0.1 -She is also treated with steroid, nebulizer -Titrate FiO2 to maintain SPO2 88 to 92%. Avoid hyperoxia. --Palliative care medicine consult by critical care due to  end-stage COPD, patient wants to be DNR, patient and family decided to pursue home hospice   short run of svt on 12/10 terminated by iv lopressor Discharge on cardizem patient wants to be DNR, patient and family decided to pursue home hospice   Leukocytosis, likely due to steroid, monitor  Hyponatremia Sodium nadir at 127, improved today 134  Hypertension Continue home medication  Lisinopril, start cardizem  Noninsulin-dependent type 2 diabetes -Controlled,-On sliding scale in the hospital,  has not needed much -Home medication Metformin held,. Resumed at discharge   History of hep C cirrhosis  Normocytic anemia  Smoking cessation education provided  FTT: ongoing weight loss, severe malnutrition Body mass index is 15.04 kg/m.  Poor prognosis, continue goals of care discussion,     DVT prophylaxis while in the hospital: enoxaparin (LOVENOX) injection 30 mg Start: 07/16/20 0930 SCDs Start: 07/15/20 1738  Code Status: Full Family Communication: Patient and his sister at bedside on 12/10 Disposition:  Home with home hospice   Consultants:   Critical care  Palliative care  Procedures:   BiPAP  Antimicrobials:   Rocephin and Zithromax   Discharge Exam: BP (!) 171/113   Pulse (!) 127   Temp 97.7 F (36.5 C) (Oral)   Resp (!) 23   Ht 5\' 2"  (1.575 m)   Wt 37.3 kg   SpO2 98%   BMI 15.04 kg/m  General: Malnourished, chronic ill-appearing Cardiovascular: Tachycardia Respiratory: Very diminished lung sounds  Discharge Instructions You were cared for by a hospitalist during your hospital stay. If you have any questions about your discharge medications or the care you received while you were in the hospital after you are discharged, you can call the unit and asked to  speak with the hospitalist on call if the hospitalist that took care of you is not available. Once you are discharged, your primary care physician will handle any further medical issues. Please note that NO REFILLS for any discharge medications will be authorized once you are discharged, as it is imperative that you return to your primary care physician (or establish a relationship with a primary care physician if you do not have one) for your aftercare needs so that they can reassess your need for medications and monitor your lab values.  Discharge Instructions    Diet general   Complete by: As directed    Increase activity slowly   Complete by: As directed      Allergies as of 07/19/2020      Reactions   Aspirin Nausea And Vomiting      Medication List    STOP taking these medications   amLODipine 5 MG tablet Commonly known as: NORVASC   amoxicillin 500 MG tablet Commonly known as: AMOXIL   amoxicillin-clavulanate 875-125 MG tablet Commonly known as: AUGMENTIN   bisoprolol-hydrochlorothiazide 5-6.25 MG tablet Commonly known as: ZIAC   Breztri Aerosphere 160-9-4.8 MCG/ACT Aero Generic drug: Budeson-Glycopyrrol-Formoterol   doxycycline 100 MG tablet Commonly known as: VIBRA-TABS     TAKE these medications   albuterol (2.5 MG/3ML) 0.083% nebulizer solution Commonly known as: PROVENTIL Take 3 mLs (2.5 mg total) by nebulization every 6 (six) hours as needed for wheezing or shortness of breath.   albuterol 108 (90 Base) MCG/ACT inhaler Commonly known as: VENTOLIN HFA INHALE 2 PUFFS INTO THE LUNGS EVERY 4 HOURS AS NEEDED FOR WHEEZING OR SHORTNESS OF BREATH.   ALPRAZolam 1 MG tablet Commonly known as: XANAX Take 1 tablet (1 mg total) by mouth 3 (three) times daily as needed for anxiety. TAKE 1 TABLET BY MOUTH 3 TIMES A DAY AS NEEDED FOR ANXIETY   buPROPion 150 MG 12 hr tablet Commonly known as: Wellbutrin SR Take 1 tablet (150 mg total) by mouth 2 (two) times daily.    cetirizine 10 MG tablet Commonly known as: ZYRTEC Take 1 tablet (10 mg total) by mouth daily.   cholecalciferol 25 MCG (1000 UNIT) tablet Commonly known as: VITAMIN D3 Take 1,000 Units by mouth daily.   Combivent Respimat 20-100 MCG/ACT Aers respimat Generic drug: Ipratropium-Albuterol INHALE 1 PUFF EVERY 6 HOURS AS NEEDED What changed: See the new instructions.   cyclobenzaprine 10 MG tablet Commonly known as: FLEXERIL TAKE 1 TABLET BY MOUTH 3 TIMES DAILY AS NEEDED FOR MUSCLE SPASMS. What changed: See the new instructions.   diltiazem 60 MG tablet Commonly known as: Cardizem Take 1 tablet (60 mg total) by mouth 3 (three) times daily.   lisinopril 20 MG tablet Commonly known as: ZESTRIL TAKE 1 TABLET BY MOUTH DAILY   metFORMIN 1000 MG tablet Commonly known as: GLUCOPHAGE TAKE 1/2 TABLET BY MOUTH TWICE A DAY FOR DIABETES. (AFTER BREAKFAST AND AFTER SUPPER) What changed: See the new instructions.   multivitamin with minerals Tabs tablet Take 1 tablet by mouth daily.   omeprazole 40 MG capsule Commonly known as: PRILOSEC TAKE 1 CAPSULE BY MOUTH DAILY.   OVER THE COUNTER  MEDICATION Take 1 tablet by mouth daily. musinex   oxyCODONE 15 MG immediate release tablet Commonly known as: ROXICODONE Take 1 tablet (15 mg total) by mouth 4 (four) times daily as needed for pain.   OXYGEN Inhale 2-3 L into the lungs daily.   predniSONE 10 MG tablet Commonly known as: DELTASONE Take 1 tablet (10 mg total) by mouth daily with breakfast.   Trelegy Ellipta 100-62.5-25 MCG/INH Aepb Generic drug: Fluticasone-Umeclidin-Vilant Inhale 1 puff into the lungs daily.      Allergies  Allergen Reactions  . Aspirin Nausea And Vomiting    Follow-up Information    Glenford Bayley, NP Follow up on 08/07/2020.   Specialty: Pulmonary Disease Why: Appt at 10:30 AM.  Please arrive at 10:15 AM for check in. Contact information: [address] [place]  [address] [phone]        home hospice Follow up.   Why: follow up with home hospice               The results of significant diagnostics from this hospitalization (including imaging, microbiology, ancillary and laboratory) are listed below for reference.    Significant Diagnostic Studies: DG Chest 2 View  Result Date: 06/24/2020 CLINICAL DATA:  COPD exacerbation. EXAM: CHEST - 2 VIEW COMPARISON:  09/26/2019. FINDINGS: Mediastinum and hilar structures normal. Heart size normal. Mild bibasilar atelectasis. Tiny bilateral pleural effusions cannot be excluded. No pneumothorax. Thoracolumbar spine scoliosis. IMPRESSION: Mild bibasilar atelectasis. Tiny bilateral pleural effusions cannot be excluded. Electronically Signed   By: Maisie Fus  Register   On: 06/24/2020 09:56   DG Chest Port 1 View  Result Date: 07/15/2020 CLINICAL DATA:  Shortness of breath. EXAM: PORTABLE CHEST 1 VIEW COMPARISON:  June 23, 2020. FINDINGS: No consolidation. Chronic hyperinflation. No visible pneumothorax. Possible small bilateral pleural effusions versus pleural thickening. Cardiomediastinal silhouette is within normal limits. No acute osseous abnormality. IMPRESSION: 1. Possible small bilateral pleural effusions versus pleural thickening. Otherwise, no acute cardiopulmonary disease. 2. Chronic hyperinflation, as can be seen with COPD/emphysema. Electronically Signed   By: Feliberto Harts MD   On: 07/15/2020 13:46    Microbiology: Recent Results (from the past 240 hour(s))  Resp Panel by RT-PCR (Flu A&B, Covid) Nasopharyngeal Swab     Status: None   Collection Time: 07/15/20 12:40 PM   Specimen: Nasopharyngeal Swab; Nasopharyngeal(NP) swabs in vial transport medium  Result Value Ref Range Status   SARS Coronavirus 2 by RT PCR NEGATIVE NEGATIVE Final    Comment: (NOTE) SARS-CoV-2 target nucleic acids are NOT DETECTED.  The SARS-CoV-2 RNA is generally detectable in upper respiratory specimens during  the acute phase of infection. The lowest concentration of SARS-CoV-2 viral copies this assay can detect is 138 copies/mL. A negative result does not preclude SARS-Cov-2 infection and should not be used as the sole basis for treatment or other patient management decisions. A negative result may occur with  improper specimen collection/handling, submission of specimen other than nasopharyngeal swab, presence of viral mutation(s) within the areas targeted by this assay, and inadequate number of viral copies(<138 copies/mL). A negative result must be combined with clinical observations, patient history, and epidemiological information. The expected result is Negative.  Fact Sheet for Patients:  BloggerCourse.com  Fact Sheet for Healthcare Providers:  SeriousBroker.it  This test is no t yet approved or cleared by the [place] FDA and  has been authorized for detection and/or diagnosis of SARS-CoV-2 by FDA under an Emergency Use Authorization (EUA). This EUA  will remain  in effect (meaning this test can be used) for the duration of the COVID-19 declaration under Section 564(b)(1) of the Act, 21 U.S.C.section 360bbb-3(b)(1), unless the authorization is terminated  or revoked sooner.       Influenza A by PCR NEGATIVE NEGATIVE Final   Influenza B by PCR NEGATIVE NEGATIVE Final    Comment: (NOTE) The Xpert Xpress SARS-CoV-2/FLU/RSV plus assay is intended as an aid in the diagnosis of influenza from Nasopharyngeal swab specimens and should not be used as a sole basis for treatment. Nasal washings and aspirates are unacceptable for Xpert Xpress SARS-CoV-2/FLU/RSV testing.  Fact Sheet for Patients: BloggerCourse.com  Fact Sheet for Healthcare Providers: SeriousBroker.it  This test is not yet approved or cleared by the [place] FDA and has been authorized for detection and/or  diagnosis of SARS-CoV-2 by FDA under an Emergency Use Authorization (EUA). This EUA will remain in effect (meaning this test can be used) for the duration of the COVID-19 declaration under Section 564(b)(1) of the Act, 21 U.S.C. section 360bbb-3(b)(1), unless the authorization is terminated or revoked.  Performed at [facility], [address] [place], [place] [address]   MRSA PCR Screening     Status: Abnormal   Collection Time: 07/15/20  8:15 PM   Specimen: Nasopharyngeal  Result Value Ref Range Status   MRSA by PCR POSITIVE (A) NEGATIVE Final    Comment:        The GeneXpert MRSA Assay (FDA approved for NASAL specimens only), is one component of a comprehensive MRSA colonization surveillance program. It is not intended to diagnose MRSA infection nor to guide or monitor treatment for MRSA infections. CRITICAL RESULT CALLED TO, READ BACK BY AND VERIFIED WITH: RN TORI AT 2145 07/15/20 CRUICKSHANK A Performed at [facility], [address] [place], [place] [address]      Labs: Basic Metabolic Panel: Recent Labs  Lab 07/16/20 0308 07/16/20 1237 07/17/20 0247 07/18/20 0329 07/19/20 0256  NA 128* 127* 129* 131* 134*  K 5.6* 4.9 5.0 4.8 4.2  CL 93* 91* 92* 92* 93*  CO2 27 29 31 31  34*  GLUCOSE 119* 112* 97 94 114*  BUN 15 17 15 15 11   CREATININE 0.86 0.85 0.77 0.65 0.69  CALCIUM 8.1* 8.3* 8.2* 8.3* 8.1*  MG 1.8  --   --   --  1.9  PHOS 3.2  --   --   --   --    Liver Function Tests: Recent Labs  Lab 07/15/20 1247  AST 27  ALT 30  ALKPHOS 51  BILITOT 0.7  PROT 5.7*  ALBUMIN 3.2*   No results for input(s): LIPASE, AMYLASE in the last 168 hours. No results for input(s): AMMONIA in the last 168 hours. CBC: Recent Labs  Lab 07/15/20 1247 07/15/20 1302 07/15/20 2044 07/16/20 0308 07/18/20 0329 07/19/20 0256  WBC 12.0*  --  10.0 6.0 13.1* 11.6*  NEUTROABS 10.3*  --   --   --   --  9.8*  HGB 10.5* 6.5* 11.5* 10.4*  10.3* 11.1*  HCT 32.9* 19.0* 35.8* 32.5* 32.4* 35.1*  MCV 92.9  --  93.2 93.9 93.1 93.1  PLT 267  --  241 185 223 231   Cardiac Enzymes: No results for input(s): CKTOTAL, CKMB, CKMBINDEX, TROPONINI in the last 168 hours. BNP: BNP (last 3 results) No results for input(s): BNP in the last 8760 hours.  ProBNP (last 3 results) No results for input(s): PROBNP in the  last 8760 hours.  CBG: Recent Labs  Lab 07/18/20 1603 07/18/20 1722 07/18/20 2014 07/18/20 2322 07/19/20 0553  GLUCAP 151* 182* 156* 108* 109*       Signed:  Albertine Grates MD, PhD, FACP  [facility] 07/19/2020, 10:58 AM

## 2020-07-19 NOTE — TOC Progression Note (Signed)
Transition of Care [facility]) - Progression Note    Patient Details  Name: Megan Soto MRN: [id number] Date of Birth: 01-Dec-1960  Transition of Care [facility]) CM/SW Contact  Armanda Heritage, RN Phone Number: 07/19/2020, 3:37 PM  Clinical Narrative:    CM spoke with patient's sister Gunnar Fusi who confirms she would like to use the services of [facility] and is really interested in getting patient home as soon as possible per her wishes.  While patient may need a hospital bed, sister feels she can still go home today and wait for a bed delivery after she is discharge so as not to delay getting home.  Referral made to [facility] rep who has spoken with family and made arrangements for hospice RN to see patient tomorrow at 10 am.  Plan is for patient to discharge today by [facility]AR.    Expected Discharge Plan: Home w Hospice Care Barriers to Discharge: No Barriers Identified  Expected Discharge Plan and Services Expected Discharge Plan: Home w Hospice Care     Post Acute Care Choice: Skilled Nursing Facility Living arrangements for the past 2 months: Single Family Home Expected Discharge Date: 07/19/20                                     Social Determinants of Health (SDOH) Interventions    Readmission Risk Interventions No flowsheet data found.

## 2020-07-19 NOTE — Progress Notes (Signed)
Daily Progress Note   Patient Name: Megan Soto       Date: 07/19/2020 DOB: 1961-01-08  Age: 59 y.o. MRN#: [id number] Attending Physician: Albertine Grates, MD Primary Care Physician: Jackelyn Poling, DO Admit Date: 07/15/2020  Reason for Consultation/Follow-up: Establishing goals of care  Subjective:  restless at times. Discussed with TRH and PCCM.  Length of Stay: 4  Current Medications: Scheduled Meds:  . (feeding supplement) PROSource Plus  30 mL Oral Daily  . amLODipine  5 mg Oral Daily  . arformoterol  15 mcg Nebulization BID  . budesonide (PULMICORT) nebulizer solution  0.5 mg Nebulization BID  . Chlorhexidine Gluconate Cloth  6 each Topical Daily  . enoxaparin (LOVENOX) injection  30 mg Subcutaneous Q24H  . feeding supplement  237 mL Oral BID BM  . guaiFENesin  600 mg Oral BID  . insulin aspart  1-3 Units Subcutaneous Q4H  . lisinopril  20 mg Oral Daily  . mouth rinse  15 mL Mouth Rinse BID  . mupirocin ointment  1 application Nasal BID  . nicotine  21 mg Transdermal Q24H  . pantoprazole  40 mg Oral Daily  . predniSONE  40 mg Oral Q breakfast    Continuous Infusions:   PRN Meds: ALPRAZolam, docusate sodium, ipratropium-albuterol, metoprolol tartrate, ondansetron (ZOFRAN) IV, oxyCODONE, polyethylene glycol  Physical Exam          chronically ill appearing adult female lying in bed in NAD  overall awake, alert and is able to converse, how ever, does get sleepy at times.   CV: s1s2 RRR, no m/r/g Audible wheezes externally, is weak and deconditioned Abdomen not distended Does not have edema Has nicotine patch.   Vital Signs: BP (!) 171/113   Pulse (!) 127   Temp 97.7 F (36.5 C) (Oral)   Resp (!) 23   Ht 5\' 2"  (1.575 m)   Wt 37.3 kg   SpO2 98%   BMI 15.04 kg/m   SpO2: SpO2: 98 % O2 Device: O2 Device: Nasal Cannula O2 Flow Rate: O2 Flow Rate (L/min): 3 L/min  Intake/output summary:   Intake/Output Summary (Last 24 hours) at 07/19/2020 1205 Last data filed at 07/19/2020 1000 Gross per 24 hour  Intake 170 ml  Output 500 ml  Net -330 ml   LBM: Last BM Date: 07/16/20 Baseline  Weight: Weight: 33.6 kg Most recent weight: Weight: 37.3 kg      PPS 30% Palliative Assessment/Data:      Patient Active Problem List   Diagnosis Date Noted  . Acute exacerbation of chronic obstructive pulmonary disease (COPD) (HCC) 07/15/2020  . Depressed mood 03/31/2020  . Family history of colon cancer   . Type 2 diabetes mellitus without complication, without long-term current use of insulin (HCC) 11/28/2017  . Chronic low back pain without sciatica 11/28/2017  . Chronic respiratory failure with hypoxia (HCC) 10/17/2016  . Chronic hepatitis C without hepatic coma (HCC) 08/20/2015  . Liver fibrosis 08/20/2015  . Essential hypertension 03/16/2014  . Anxiety state 04/28/2010  . DM 02/24/2010  . GERD 11/04/2009  . Tobacco abuse 10/21/2008  . INSOMNIA 02/28/2008  . COPD mixed type (HCC) 10/16/2007    Palliative Care Assessment & Plan   Patient Profile:  59 year old lady with severe chronic obstructive pulmonary disease and chronic hypoxic respiratory failure, she is on 2 L supplemental oxygen via nasal cannula at home.  She has been admitted to stepdown unit at [facility] in [place], [place] because of shortness of breath.  Off-and-on she has been on antibiotics as well as steroids.  Unfortunately, she has continued to smoke up until very recently.  She also has fibromyalgia, diabetes, hypertension and hepatitis C history.  Assessment: Acute on chronic hypoxic and hypercapnic respiratory failure COPD exacerbation Tobacco use Functional decline Severe protein calorie malnutrition Chronic pain Wheezing, shortness of  breath.  Recommendations/Plan: Recommend home with hospice when medical equipment can be delivered.  Prognosis likely few days. Continue emphasis on comfort measures.      Code Status:    Code Status Orders  (From admission, onward)         Start     Ordered   07/18/20 1400  Do not attempt resuscitation (DNR)  Continuous       Question Answer Comment  In the event of cardiac or respiratory ARREST Do not call a "code blue"   In the event of cardiac or respiratory ARREST Do not perform Intubation, CPR, defibrillation or ACLS   In the event of cardiac or respiratory ARREST Use medication by any route, position, wound care, and other measures to relive pain and suffering. May use oxygen, suction and manual treatment of airway obstruction as needed for comfort.      07/18/20 1359        Code Status History    Date Active Date Inactive Code Status Order ID Comments User Context   07/15/2020 1740 07/18/2020 1359 Full Code [id number]  Steffanie Dunn, DO ED   Advance Care Planning Activity      Prognosis:  days  Discharge Planning: Home with Hospice over the weekend.   Care plan was discussed with IDT.  Thank you for allowing the Palliative Medicine Team to assist in the care of this patient.   Time In: 11 Time Out: 11.25 Total Time 25 Prolonged Time Billed  no       Greater than 50%  of this time was spent counseling and coordinating care related to the above assessment and plan.  Rosalin Hawking, MD  Please contact Palliative Medicine Team phone at [phone] for questions and concerns.

## 2020-07-28 ENCOUNTER — Inpatient Hospital Stay
Admit: 2020-07-28 | Discharge: 2020-07-28 | Disposition: A | Discharge status: Home / Self Care | Payer: MEDICAID | Attending: Emergency Medicine

## 2020-07-28 DIAGNOSIS — S39012A Strain of muscle, fascia and tendon of lower back, initial encounter: Secondary | ICD-10-CM

## 2020-07-28 MED ORDER — IBUPROFEN 800 MG TAB
800 mg | ORAL | Status: AC
Start: 2020-07-28 — End: 2020-07-28
  Administered 2020-07-28: 16:00:00 via ORAL

## 2020-07-28 MED ORDER — ONDANSETRON 4 MG TAB, RAPID DISSOLVE
4 mg | ORAL | Status: AC
Start: 2020-07-28 — End: 2020-07-28
  Administered 2020-07-28: 16:00:00 via ORAL

## 2020-07-28 MED ORDER — CYCLOBENZAPRINE 10 MG TAB
10 mg | ORAL_TABLET | Freq: Three times a day (TID) | ORAL | 0 refills | Status: AC | PRN
Start: 2020-07-28 — End: 2020-08-02

## 2020-07-28 MED ORDER — ACETAMINOPHEN 325 MG TABLET
325 mg | ORAL_TABLET | ORAL | 0 refills | Status: DC | PRN
Start: 2020-07-28 — End: 2021-03-10

## 2020-07-28 MED FILL — ONDANSETRON 4 MG TAB, RAPID DISSOLVE: 4 mg | ORAL | Qty: 1

## 2020-07-28 MED FILL — IBUPROFEN 800 MG TAB: 800 mg | ORAL | Qty: 1

## 2020-07-28 NOTE — ED Notes (Signed)
Reports Ibuprofen causes nausea only. No anaphylactic reactions. Dr Mallie Mussel aware.

## 2020-07-28 NOTE — ED Notes (Signed)
Pt reports lifting box to place onto truck bed on 07/26/2020. Pt states she felt as if she twisted her lower back. Pt rates pain at 7/10. Pt did not take any meds. Pt ambulatory to room. Full ROM noted.

## 2020-07-28 NOTE — ED Provider Notes (Signed)
ED Provider Notes by Domingo Dimes, MD at 07/28/20 1030                Author: Domingo Dimes, MD  Service: Emergency Medicine  Author Type: Physician       Filed: 07/29/20 1134  Date of Service: 07/28/20 1030  Status: Signed          Editor: Domingo Dimes, MD (Physician)               EMERGENCY DEPARTMENT HISTORY AND PHYSICAL EXAM           Date: 07/28/2020   Patient Name: Laurie Miller        History of Presenting Illness          Chief Complaint       Patient presents with        ?  Back Pain           History Provided By: Patient      HPI: Laurie Miller,  59 y.o. female with a past medical history significant  No significant past medical history presents to the ED with cc of 2 days ago patient was lifting a heavy box during her moving process into the back of truck bed she made a twisting motion and felt  a sudden onset of pain in her left lower back, pain intensity 7/10, pain worsened with flexion and twisting of her torso      There are no other complaints, changes, or physical findings at this time.      PCP: No primary care provider on file.        No current facility-administered medications on file prior to encounter.          Current Outpatient Medications on File Prior to Encounter          Medication  Sig  Dispense  Refill           ?  butorphanol (STADOL) 10 mg/mL nasal spray  use ONE SPRAY intranasal if no RELIEF in 60-90 minutes,second DOSE MAY be given MAY REPEAT in 3 TO 4 HOURS AS NEEDED               ?  promethazine (PHENERGAN) 50 mg/mL injection  by Not Applicable route.                 Past History        Past Medical History:   History reviewed. No pertinent past medical history.      Past Surgical History:     Past Surgical History:         Procedure  Laterality  Date          ?  HX ORTHOPAEDIC               Family History:   History reviewed. No pertinent family history.      Social History:     Social History          Tobacco Use         ?  Smoking status:   Never Smoker     ?  Smokeless tobacco:  Never Used       Substance Use Topics         ?  Alcohol use:  Not Currently         ?  Drug use:  Not on file           Allergies:     Allergies  Allergen  Reactions         ?  Aspirin  Nausea Only     ?  Celecoxib  Nausea Only     ?  Ibuprofen  Nausea Only     ?  Lortab [Hydrocodone-Acetaminophen]  Nausea Only     ?  Naproxen  Nausea Only     ?  Toradol [Ketorolac]  Nausea Only         ?  Tramadol  Nausea Only                Review of Systems        Review of Systems    Constitutional: Negative for chills and fever.    HENT: Negative for rhinorrhea and sore throat.     Eyes: Negative for pain and visual disturbance.    Respiratory: Negative for cough and shortness of breath.     Cardiovascular: Negative for chest pain and leg swelling.    Gastrointestinal: Negative for abdominal pain and vomiting.    Endocrine: Negative for polydipsia and polyuria.    Genitourinary: Negative for dysuria and urgency.    Musculoskeletal: Positive for back pain. Negative for myalgias.    Skin: Negative for color change and pallor.    Neurological: Negative for weakness and numbness.    Psychiatric/Behavioral: Negative.             Physical Exam        Physical Exam   Vitals and nursing note reviewed.   Constitutional:        Appearance: Normal appearance.   HENT :       Head: Normocephalic.      Mouth/Throat:      Mouth: Mucous membranes are moist.      Pharynx: Oropharynx is clear.    Eyes:       Extraocular Movements: Extraocular movements intact.      Conjunctiva/sclera: Conjunctivae normal.      Pupils: Pupils are equal, round, and reactive to light.   Cardiovascular :       Rate and Rhythm: Normal rate and regular rhythm.      Pulses: Normal pulses.      Heart sounds: Normal heart sounds.    Pulmonary:       Effort: Pulmonary effort is normal. No respiratory distress.      Breath sounds: Normal breath sounds.    Abdominal:      General: Bowel sounds are normal.      Palpations:  Abdomen is soft.      Tenderness: There is no abdominal tenderness.     Musculoskeletal:          General: Tenderness present. No deformity or signs of injury.      Cervical back: Normal range of motion and neck supple.         Back:       Skin:      General: Skin is warm and dry.      Capillary Refill: Capillary refill takes less than 2 seconds.    Neurological:       General: No focal deficit present.      Mental Status: She is alert.    Psychiatric:         Mood and Affect: Mood normal.         Behavior: Behavior normal.               Lab and Diagnostic Study Results  Labs -    No results found for this or any previous visit (from the past 12 hour(s)).      Radiologic Studies -    @lastxrresult @     CT Results   (Last 48 hours)          None                 CXR Results   (Last 48 hours)          None                       Medical Decision Making     - I am the first provider for this patient.      - I reviewed the vital signs, available nursing notes, past medical history, past surgical history, family history and social history.      - Initial assessment performed. The patients presenting problems have been discussed, and they are in agreement with the care plan formulated and outlined with them.  I have encouraged them to ask questions as they arise throughout their visit.      Vital Signs-Reviewed the patient's vital signs.   Patient Vitals for the past 12 hrs:            Temp  Pulse  Resp  BP  SpO2            07/28/20 1030  97.8 ??F (36.6 ??C)  89  18  (!) 153/91  98 %           Records Reviewed: Nursing Notes      The patient presents with back pain with a differential diagnosis of  kidney stone, lumbar strain and pneumonia         ED Course:              Provider Notes (Medical Decision Making):       MDM            Procedures     Medical Decision Makingedical Decision Making   Performed by: Domingo DimesSherron Benn-Thompson, MD   PROCEDURES:   Procedures            Disposition     Disposition: Condition stable and  improved   DC- Adult Discharges: All of the diagnostic tests were reviewed and questions answered. Diagnosis, care plan and treatment options were discussed.  The patient understands the instructions and will follow up as directed. The patients results have been  reviewed with them.  They have been counseled regarding their diagnosis.  The patient verbally convey understanding and agreement of the signs, symptoms, diagnosis, treatment and prognosis and additionally agrees to follow up as recommended with their  PCP in 24 - 48 hours.  They also agree with the care-plan and convey that all of their questions have been answered.  I have also put together some discharge instructions for them that include: 1) educational information regarding their diagnosis, 2)  how to care for their diagnosis at home, as well a 3) list of reasons why they would want to return to the ED prior to their follow-up appointment, should their condition change.            DISCHARGE PLAN:   1.      Current Discharge Medication List              CONTINUE these medications which have NOT CHANGED          Details  butorphanol (STADOL) 10 mg/mL nasal spray  use ONE SPRAY intranasal if no RELIEF in 60-90 minutes,second DOSE MAY be given MAY REPEAT in 3 TO 4 HOURS AS NEEDED               promethazine (PHENERGAN) 50 mg/mL injection  by Not Applicable route.                      2.      Follow-up Information      None             3.  Return to ED if worse    4.      Current Discharge Medication List                       Diagnosis        Clinical Impression: No diagnosis found.      Attestations:      Domingo Dimes, MD      Please note that this dictation was completed with Dragon, the computer voice recognition software.  Quite often unanticipated grammatical, syntax, homophones, and other interpretive errors are inadvertently  transcribed by the computer software.  Please disregard these errors.  Please excuse any errors that have  escaped final proofreading.  Thank you.

## 2020-07-28 NOTE — ED Notes (Signed)
D/C instructions reviewed with patient.  Verbalized understanding.

## 2020-07-29 ENCOUNTER — Telehealth: Payer: Self-pay

## 2020-07-29 NOTE — Telephone Encounter (Signed)
Transition Care Management Unsuccessful Follow-up Telephone Call  Date of discharge and from where:  07/19/2020  Attempts:  1st Attempt  Reason for unsuccessful TCM follow-up call:    Patient was seen at [facility]. Patient is followed by Hospice and is no longer alert. Vitals are stable.   Closing note.

## 2020-08-07 ENCOUNTER — Inpatient Hospital Stay: Payer: Medicaid Other | Admitting: Primary Care

## 2020-08-09 DEATH — deceased

## 2020-09-17 NOTE — Progress Notes (Deleted)
Patient ID: Megan Soto, female    DOB: [birth year], 60 y.o.   MRN: [id number]  HPI  female smoker followed for COPD, complicated by anxiety, DM, GERD, musculoskeletal pain/pain clinic, hepatitis C Walk test on room air 10/11/16 -Room air resting saturation 88%, desaturated to 86% walking on room air, recovered on 3 L to 97% Office Spirometry 10/11/2016-severe obstructive airways disease-FVC 2.30/73%, FEV1 1.21/49%, ratio 0.52 --------------------------------------------------------------------------------------    06/23/20- 60 year old female Smoker followed for COPD, hypoxic respiratory failure, complicated by Anxiety, DM2, GERD, musculoskeletal pain/pain clinic, Hepatitis C O2  2L sleep and prn/  LIncare, Alprazolam,   Anoro, Albuterol hfa, Neb albuterol, Combivent, prednisone 10 mg daily, Trelegy 100,  Covid vax- none Flu vax- today standard -----sob on exertion ,pt states fatigue , coughing up green and brown mucus, pt takes 2 prednisone daily Denies fever. Cough productive and discolored 2-3 days. Losing weight and feeling weak with DOE on O2 2-3L. No blood, nodes or edema.  CXR 09/26/19 IMPRESSION: Pulmonary hyperinflation and emphysema without acute abnormality of the lungs.  09/18/20-  60 year old female Smoker followed for COPD, hypoxic respiratory failure, complicated by Anxiety, DM2, GERD, musculoskeletal pain/pain clinic, Hepatitis C O2  2L sleep and prn/  LIncare, Alprazolam,   Anoro, Albuterol hfa, Neb albuterol, Combivent, prednisone 10 mg daily, Trelegy 100,  Hospice patient/ DNR   Since hosp in Dec, 2021   ROS-see HPI   + = positive Constitutional:    weight loss, night sweats, fevers, chills, fatigue, lassitude. HEENT:    headaches, difficulty swallowing, tooth/dental problems, sore throat,       sneezing, itching, ear ache, nasal congestion, post nasal drip, snoring CV:    chest pain, orthopnea, PND, swelling in lower extremities, anasarca,                                                dizziness, palpitations Resp:  + shortness of breath with exertion or at rest.                +productive cough,   + non-productive cough, coughing up of blood.              change in color of mucus.   wheezing.   Skin:    rash or lesions. GI:  No-   heartburn, indigestion, abdominal pain, nausea, vomiting, GU: dysuria, change in color of urine, no urgency or frequency.   flank pain. MS:  + joint pain, stiffness, decreased range of motion, back pain. Neuro-     nothing unusual Psych:  change in mood or affect.  +depression or anxiety.   memory loss.  OBJ- Physical Exam General- Alert, Oriented, Affect-pleasant/  anxious, Distress- none acute,  + petite. +Has aged considerably since last here. Frail. O2 3L has POC. Skin- rash-none, lesions- none, excoriation- none Lymphadenopathy- none Head- atraumatic            Eyes- Gross vision intact, PERRLA, conjunctivae and secretions clear            Ears- Hearing, canals-normal            Nose- Clear, no-Septal dev, mucus, polyps, erosion, perforation             Throat- Mallampati II , mucosa clear , drainage- none, tonsils- atrophic, + missing teeth ,  Neck- flexible , trachea midline, no stridor ,  thyroid nl, carotid no bruit Chest - symmetrical excursion , unlabored, no masses           Heart/CV- RRR , no murmur , no gallop  , no rub, nl s1 s2                           - JVD- none , edema- none, stasis changes- none, varices- none           Lung-  Cough + deep, rhonchi+few, wheeze-none, dullness-none, rub- none Abd-  Br/ Gen/ Rectal- Not done, not indicated Extrem- cyanosis- none, clubbing, none, atrophy- none, strength- nl Neuro- grossly intact to observation

## 2020-09-18 ENCOUNTER — Ambulatory Visit: Payer: Medicaid Other | Admitting: Internal Medicine

## 2020-09-30 ENCOUNTER — Inpatient Hospital Stay
Admit: 2020-09-30 | Discharge: 2020-09-30 | Disposition: A | Discharge status: Home / Self Care | Payer: MEDICAID | Attending: Emergency Medicine

## 2020-09-30 ENCOUNTER — Emergency Department: Admit: 2020-09-30 | Payer: MEDICAID

## 2020-09-30 DIAGNOSIS — S20212A Contusion of left front wall of thorax, initial encounter: Secondary | ICD-10-CM

## 2020-09-30 MED ORDER — LIDOCAINE 3.5 % TOPICAL PATCH
3.5 % | Freq: Once | CUTANEOUS | 0 refills | Status: AC
Start: 2020-09-30 — End: ?

## 2020-09-30 MED ORDER — IBUPROFEN 800 MG TAB
800 mg | Freq: Once | ORAL | Status: AC
Start: 2020-09-30 — End: 2020-09-30
  Administered 2020-09-30: 16:00:00 via ORAL

## 2020-09-30 MED ORDER — IBUPROFEN 600 MG TAB
600 mg | ORAL_TABLET | Freq: Four times a day (QID) | ORAL | 0 refills | Status: DC | PRN
Start: 2020-09-30 — End: 2021-03-10

## 2020-09-30 MED FILL — IBUPROFEN 800 MG TAB: 800 mg | ORAL | Qty: 1

## 2020-09-30 NOTE — ED Notes (Signed)
 Pt reports left-rib cage pain x 5 days--reports she may have hurt it on a playground pole.

## 2020-09-30 NOTE — ED Provider Notes (Signed)
ED Provider Notes by Domingo DimesBenn-Thompson, Juletta Berhe, MD at 09/30/20 1104                Author: Domingo DimesBenn-Thompson, Syed Zukas, MD  Service: Emergency Medicine  Author Type: Physician       Filed: 09/30/20 1125  Date of Service: 09/30/20 1104  Status: Signed          Editor: Domingo DimesBenn-Thompson, Aalina Brege, MD (Physician)               EMERGENCY DEPARTMENT HISTORY AND PHYSICAL EXAM           Date: 09/30/2020   Patient Name: Laurie BeltsRobin Goldring        History of Presenting Illness          Chief Complaint       Patient presents with        ?  Rib Pain           History Provided By: Patient      HPI: Laurie Beltsobin Broadwell,  60 y.o. female with a past medical history significant  No significant past medical history presents to the ED with cc of left lower rib pain, patient states she was playing with her grandchild 6 days ago lifting her and she fell onto a pole, patient states  pain worsened with deep breathing, direct pressure and lateral movement      There are no other complaints, changes, or physical findings at this time.      PCP: None        No current facility-administered medications on file prior to encounter.          Current Outpatient Medications on File Prior to Encounter          Medication  Sig  Dispense  Refill           ?  butorphanol (STADOL) 10 mg/mL nasal spray  use ONE SPRAY intranasal if no RELIEF in 60-90 minutes,second DOSE MAY be given MAY REPEAT in 3 TO 4 HOURS AS NEEDED         ?  promethazine (PHENERGAN) 50 mg/mL injection  by Not Applicable route.               ?  acetaminophen (TYLENOL) 325 mg tablet  Take 2 Tablets by mouth every four (4) hours as needed for Pain.  20 Tablet  0             Past History        Past Medical History:   No past medical history on file.      Past Surgical History:     Past Surgical History:         Procedure  Laterality  Date          ?  HX ORTHOPAEDIC               Family History:   No family history on file.      Social History:     Social History          Tobacco Use         ?  Smoking  status:  Never Smoker     ?  Smokeless tobacco:  Never Used       Substance Use Topics         ?  Alcohol use:  Not Currently         ?  Drug use:  Not on file  Allergies:     Allergies        Allergen  Reactions         ?  Aspirin  Nausea Only     ?  Celecoxib  Nausea Only     ?  Ibuprofen  Nausea Only     ?  Lortab [Hydrocodone-Acetaminophen]  Nausea Only     ?  Naproxen  Nausea Only     ?  Toradol [Ketorolac]  Nausea Only         ?  Tramadol  Nausea Only                Review of Systems        Review of Systems    Constitutional: Negative for chills and fever.    HENT: Negative for rhinorrhea and sore throat.     Eyes: Negative for pain and visual disturbance.    Respiratory: Negative for cough and shortness of breath.     Cardiovascular: Negative for chest pain and leg swelling.    Gastrointestinal: Negative for abdominal pain and vomiting.    Endocrine: Negative for polydipsia and polyuria.    Genitourinary: Negative for dysuria and hematuria.    Musculoskeletal: Positive for myalgias. Negative for back pain and neck pain.    Skin: Negative for color change and pallor.    Neurological: Negative for weakness and headaches.    Psychiatric/Behavioral: Negative for agitation and suicidal ideas.            Physical Exam        Physical Exam   Vitals and nursing note reviewed.   Constitutional:        General: She is not in acute distress.     Appearance: She is not ill-appearing, toxic-appearing or diaphoretic.    HENT:       Head: Normocephalic and atraumatic.      Right Ear: Tympanic membrane normal.      Left Ear: Tympanic membrane normal.      Nose: Nose normal. No congestion.      Mouth/Throat:      Mouth: Mucous membranes are moist.      Pharynx:  Oropharynx is clear.   Eyes :       Extraocular Movements: Extraocular movements intact.      Conjunctiva/sclera: Conjunctivae normal.      Pupils: Pupils are equal, round, and reactive to light.   Cardiovascular :       Rate and Rhythm: Normal rate and  regular rhythm.      Pulses: Normal pulses.      Heart sounds: Normal heart sounds.    Pulmonary:       Effort: Pulmonary effort is normal.      Breath sounds: Normal breath sounds.   Chest:       Chest wall: Tenderness present. No crepitus.         Abdominal :      General: Bowel sounds are normal.      Palpations: Abdomen is soft.      Tenderness: There is no abdominal tenderness.     Musculoskeletal:          General: No tenderness, deformity or signs of injury. Normal range of motion.      Cervical back: Normal range of motion and neck supple. No rigidity or tenderness.     Lymphadenopathy:       Cervical: No cervical adenopathy.   Skin :      General: Skin  is warm and dry.      Capillary Refill: Capillary refill takes less than 2 seconds.      Findings: No rash.    Neurological:       General: No focal deficit present.      Mental Status: She is alert and oriented to person, place, and time.      Cranial Nerves: No cranial nerve deficit.      Sensory: No sensory deficit.   Psychiatric:         Mood and Affect: Mood normal.          Behavior: Behavior normal.               Lab and Diagnostic Study Results        Labs -    No results found for this or any previous visit (from the past 12 hour(s)).      Radiologic Studies -    @lastxrresult @     CT Results   (Last 48 hours)          None                 CXR Results   (Last 48 hours)          None                       Medical Decision Making     - I am the first provider for this patient.      - I reviewed the vital signs, available nursing notes, past medical history, past surgical history, family history and social history.      - Initial assessment performed. The patients presenting problems have been discussed, and they are in agreement with the care plan formulated and outlined with them.  I have encouraged them to ask questions as they arise throughout their visit.      Vital Signs-Reviewed the patient's vital signs.   No data found.      Records Reviewed:  Nursing Notes      The patient presents with chest pain with a differential diagnosis of  chest wall pain, costochondritis and pnuemonia         ED Course:              Provider Notes (Medical Decision Making):    MDM            Procedures     Medical Decision Makingedical Decision Making   Performed by: , MD   PROCEDURES:   Procedures            Disposition     Disposition: Condition stable and improved   DC- Adult Discharges: All of the diagnostic tests were reviewed and questions answered. Diagnosis, care plan and treatment options were discussed.  The patient understands the instructions and will follow up as directed. The patients results have been  reviewed with them.  They have been counseled regarding their diagnosis.  The patient verbally convey understanding and agreement of the signs, symptoms, diagnosis, treatment and prognosis and additionally agrees to follow up as recommended with their  PCP in 24 - 48 hours.  They also agree with the care-plan and convey that all of their questions have been answered.  I have also put together some discharge instructions for them that include: 1) educational information regarding their diagnosis, 2)  how to care for their diagnosis at home, as well a 3) list of reasons why they  would want to return to the ED prior to their follow-up appointment, should their condition change.            DISCHARGE PLAN:   1.      Current Discharge Medication List              CONTINUE these medications which have NOT CHANGED          Details        butorphanol (STADOL) 10 mg/mL nasal spray  use ONE SPRAY intranasal if no RELIEF in 60-90 minutes,second DOSE MAY be given MAY REPEAT in 3 TO 4 HOURS AS NEEDED               promethazine (PHENERGAN) 50 mg/mL injection  by Not Applicable route.               acetaminophen (TYLENOL) 325 mg tablet  Take 2 Tablets by mouth every four (4) hours as needed for Pain.   Qty: 20 Tablet, Refills: 0                      2.       Follow-up Information      None             3.  Return to ED if worse    4.      Current Discharge Medication List                       Diagnosis        Clinical Impression: No diagnosis found.      Attestations:      Domingo Dimes, MD      Please note that this dictation was completed with Dragon, the computer voice recognition software.  Quite often unanticipated grammatical, syntax, homophones, and other interpretive errors are inadvertently  transcribed by the computer software.  Please disregard these errors.  Please excuse any errors that have escaped final proofreading.  Thank you.

## 2021-03-10 ENCOUNTER — Inpatient Hospital Stay
Admit: 2021-03-10 | Discharge: 2021-03-10 | Disposition: A | Discharge status: Home / Self Care | Payer: MEDICAID | Attending: Emergency Medicine

## 2021-03-10 ENCOUNTER — Emergency Department: Admit: 2021-03-10 | Payer: MEDICAID

## 2021-03-10 DIAGNOSIS — M479 Spondylosis, unspecified: Secondary | ICD-10-CM

## 2021-03-10 MED ORDER — ACETAMINOPHEN 325 MG TABLET
325 mg | ORAL_TABLET | ORAL | 0 refills | Status: AC | PRN
Start: 2021-03-10 — End: ?

## 2021-03-10 MED ORDER — IBUPROFEN 800 MG TAB
800 mg | Freq: Once | ORAL | Status: DC
Start: 2021-03-10 — End: 2021-03-10

## 2021-03-10 MED ORDER — KETOROLAC TROMETHAMINE 10 MG TAB
10 mg | ORAL_TABLET | Freq: Four times a day (QID) | ORAL | 0 refills | Status: AC | PRN
Start: 2021-03-10 — End: ?

## 2021-03-10 MED ORDER — DIAZEPAM 5 MG TAB
5 mg | Freq: Once | ORAL | Status: AC
Start: 2021-03-10 — End: 2021-03-10
  Administered 2021-03-10: 15:00:00 via ORAL

## 2021-03-10 MED ORDER — DIAZEPAM 2 MG TAB
2 mg | ORAL_TABLET | Freq: Three times a day (TID) | ORAL | 0 refills | Status: AC | PRN
Start: 2021-03-10 — End: ?

## 2021-03-10 MED ORDER — ACETAMINOPHEN 500 MG TAB
500 mg | Freq: Once | ORAL | Status: AC
Start: 2021-03-10 — End: 2021-03-10
  Administered 2021-03-10: 15:00:00 via ORAL

## 2021-03-10 MED FILL — ACETAMINOPHEN 500 MG TAB: 500 mg | ORAL | Qty: 2

## 2021-03-10 MED FILL — DIAZEPAM 5 MG TAB: 5 mg | ORAL | Qty: 1

## 2021-03-10 MED FILL — IBUPROFEN 800 MG TAB: 800 mg | ORAL | Qty: 1

## 2021-03-10 NOTE — ED Notes (Signed)
Pt c/o pain and muscle spasms in rt arm going up to the shoulder that has been going on for a long time

## 2021-03-10 NOTE — ED Provider Notes (Signed)
ED Provider Notes by Burnard Hawthorne, MD at 03/10/21 1216                Author: Burnard Hawthorne, MD  Service: --  Author Type: Physician       Filed: 03/11/21 1104  Date of Service: 03/10/21 1216  Status: Signed          Editor: Burnard Hawthorne, MD (Physician)               EMERGENCY DEPARTMENT HISTORY AND PHYSICAL EXAM           Date: 03/10/2021   Patient Name: Laurie Miller        History of Presenting Illness          Chief Complaint       Patient presents with        ?  Arm Pain           History Provided By: Patient      HPI: Laurie Miller, 60 y.o. female without significant past medical history but does report recurrent right sided arm and shoulder pains presents  to the ED with spasming of her right upper arm.  She reports a longstanding history of the same.  Has never been evaluated for this before except by a neurologist who states saw her several months ago and told her it was simple muscle spasms.  She states  that when she gets stressed out she has these episodes and most recently began with symptoms over the past several days.  She has taken Flexeril and methocarbamol in the past without relief.  She states this has been an ongoing issue for "years" no numbness  no tingling no weakness.  She denies having any imaging of her head or neck in the past.  Reports the pain extends currently in her upper arm but radiates into her right shoulder and neck as well.      There are no other complaints, changes, or physical findings at this time.      PCP: None        No current facility-administered medications on file prior to encounter.          Current Outpatient Medications on File Prior to Encounter          Medication  Sig  Dispense  Refill           ?  lidocaine 3.5 % ptmd  1 Patch by Apply Externally route once over twenty-four (24) hours.  10 Each  0           ?  butorphanol (STADOL) 10 mg/mL nasal spray  use ONE SPRAY intranasal if no RELIEF in 60-90 minutes,second DOSE MAY be given  MAY REPEAT in 3 TO 4 HOURS AS NEEDED               ?  promethazine (PHENERGAN) 50 mg/mL injection  by Not Applicable route.                 Past History        Past Medical History:   History reviewed. No pertinent past medical history.      Past Surgical History:     Past Surgical History:         Procedure  Laterality  Date          ?  HX ORTHOPAEDIC               Family History:   History  reviewed. No pertinent family history.      Social History:     Social History          Tobacco Use         ?  Smoking status:  Never     ?  Smokeless tobacco:  Never       Substance Use Topics         ?  Alcohol use:  Not Currently           Allergies:     Allergies        Allergen  Reactions         ?  Aspirin  Nausea Only     ?  Celecoxib  Nausea Only     ?  Ibuprofen  Nausea Only     ?  Lortab [Hydrocodone-Acetaminophen]  Nausea Only     ?  Naproxen  Nausea Only     ?  Toradol [Ketorolac]  Nausea Only         ?  Tramadol  Nausea Only             Review of Systems     Review of Systems    Constitutional:  Negative for appetite change and fever.    HENT: Negative.      Eyes: Negative.     Respiratory: Negative.  Negative for shortness of breath.     Gastrointestinal:  Negative for abdominal pain and nausea.    Genitourinary: Negative.     Musculoskeletal:  Positive for arthralgias, back pain , myalgias and neck pain. Negative  for joint swelling and neck stiffness.    Skin:  Negative for color change, rash and wound.    Neurological: Negative.  Negative for weakness and numbness.    Psychiatric/Behavioral:  The patient is not nervous/anxious.          Physical Exam     Physical Exam   Vitals and nursing note reviewed. Exam conducted with a chaperone present.    Constitutional:        Appearance: Normal appearance. She is normal weight.    HENT:       Head: Normocephalic and atraumatic.       Nose: Nose normal.       Mouth/Throat:       Mouth: Mucous membranes are moist.       Pharynx: Oropharynx is clear.    Eyes:        Extraocular Movements: Extraocular movements intact.       Conjunctiva/sclera: Conjunctivae normal.       Pupils: Pupils are equal, round, and reactive to light.     Cardiovascular:       Rate and Rhythm: Normal rate and regular rhythm.       Pulses: Normal pulses.       Heart sounds: Normal heart sounds.    Pulmonary:       Effort: Pulmonary effort is normal. No respiratory distress.       Breath sounds: Normal breath sounds.     Abdominal:       General: Abdomen is flat. Bowel sounds are normal. There is no distension.       Palpations: Abdomen is soft.       Tenderness: There is no abdominal tenderness. There is no guarding.     Musculoskeletal:          General: Tenderness present. No swelling, deformity or signs of injury.  Arms:           Cervical back: Normal range of motion and neck supple.       Right lower leg: No edema.       Left lower leg: No edema.       Comments: With palpation at the glenohumeral joint.  Pain with flexion extension of the glenohumeral joint.  Pain with abduction abduction of the glenohumeral joint.  Tenderness along the trapezius  up to the paraspinous muscles on the right side of the neck.  Pulses intact sensation intact light touch strength intact throughout.    Skin:      General: Skin is warm and dry.       Capillary Refill: Capillary refill takes less than 2 seconds.       Findings: No lesion or rash.    Neurological:       General: No focal deficit present.       Mental Status: She is alert and oriented to person, place, and time. Mental status is at baseline.       Cranial Nerves: No cranial nerve deficit.    Psychiatric:          Mood and Affect: Mood normal.          Behavior: Behavior normal.          Thought Content: Thought content normal.          Judgment: Judgment normal.            Medical Decision Making and ED Course     - I am the first provider for this patient.  I reviewed the vital signs, available nursing notes, past medical history, past surgical  history, family history and social history.      - Initial assessment performed. The patients presenting problems have been discussed, and they are in agreement with the care plan formulated and outlined with them.  I have encouraged them to ask questions as they arise throughout their visit.      Vital Signs-Reviewed the patient's vital signs.   No data found.      Differential Diagnosis & Medical Decision Making Provider Note:       MDM   Number of Diagnoses or Management Options   Spondylosis   Strain of right shoulder, initial encounter   Trapezius muscle spasm   Diagnosis management comments: 60 year old female presenting with acute on chronic right arm neck and shoulder pain.  She does sleep on the side frequently.  I suspect this is degree of arthritic degenerative changes and muscle spasm.  X-ray shows some  degenerative changes but no acute abnormality.  Advised following up with a primary care doctor or with orthopedics reviewed with her neurologist discussed MRI imaging of her neck potentially her shoulder as well.  Follow-up as needed patient does report  significant relief with the Valium which will also act as an anxiety lytic as patient also going through some family troubles as well which is exacerbating her symptoms.          ED Course:             Disposition     Disposition: Condition stable and improved   DC- Adult Discharges: All of the diagnostic tests were reviewed and questions answered. Diagnosis, care plan and treatment options were discussed.  The patient understands the instructions and will follow up as directed. The patients results have been  reviewed with them.  They have been counseled regarding their  diagnosis.  The patient verbally convey understanding and agreement of the signs, symptoms, diagnosis, treatment and prognosis and additionally agrees to follow up as recommended with their  PCP in 24 - 48 hours.  They also agree with the care-plan and convey that all of their questions  have been answered.  I have also put together some discharge instructions for them that include: 1) educational information regarding their diagnosis, 2)  how to care for their diagnosis at home, as well a 3) list of reasons why they would want to return to the ED prior to their follow-up appointment, should their condition change.   DC-The patient was given verbal chest pain warning signs and and follow-up instructions   DC- Pain Control DC Home plan: Nonsteroidals, Tylenol, Muscle relaxants, and Referral Family Medicine/PCP and Orthopedics   Discharged      DISCHARGE PLAN:   1. Cannot display discharge medications since this patient is not currently admitted.      2.      Follow-up Information                  Follow up With  Specialties  Details  Why  Contact Info              [facility]  Emergency Medicine  Go to   As needed, or for any concerns or deteriorations., if symptoms persist or worsen.  [address]   [place] [address]   [phone]              Eulis Canner, MD  Orthopedic Surgery  Go in 1 week  If symptoms persist or worsen  [address]   [place] [address]   [phone]                 Your primary doctor    Schedule an appointment as soon as possible for a visit in 3 days  As needed, For followup and recheck of todays symptoms                    3.  Return to ED if worse    4.      Discharge Medication List as of 03/10/2021 12:07 PM                 START taking these medications          Details        diazePAM (Valium) 2 mg tablet  Take 2.5 Tablets by mouth every eight (8) hours as needed for Muscle Spasm(s) or Anxiety. Max Daily Amount: 15 mg., Normal, Disp-20 Tablet, R-0               acetaminophen (TYLENOL) 325 mg tablet  Take 2 Tablets by mouth every four (4) hours as needed for Pain., Normal, Disp-20 Tablet, R-0               ketorolac (TORADOL) 10 mg tablet  Take 1 Tablet by mouth every six (6) hours as needed for Pain., Normal, Disp-20 Tablet, R-0                         CONTINUE these medications which have NOT CHANGED          Details        lidocaine 3.5 % ptmd  1 Patch by Apply Externally route once over twenty-four (24) hours., Normal, Disp-10 Each, R-0  butorphanol (STADOL) 10 mg/mL nasal spray  use ONE SPRAY intranasal if no RELIEF in 60-90 minutes,second DOSE MAY be given MAY REPEAT in 3 TO 4 HOURS AS NEEDED, Historical Med               promethazine (PHENERGAN) 50 mg/mL injection  by Not Applicable route., Historical Med                           Diagnosis/Clinical Impression        Clinical Impression:       1.  Spondylosis      2.  Trapezius muscle spasm         3.  Strain of right shoulder, initial encounter            Attestations: I,  Burnard HawthorneMichael W Randle Shatzer, MD, am the primary clinician of record.      Please note that this dictation was completed with Dragon, the computer voice recognition software.  Quite often unanticipated grammatical, syntax, homophones, and other interpretive errors are inadvertently  transcribed by the computer software.  Please disregard these errors.  Please excuse any errors that have escaped final proofreading.  Thank you.

## 2021-11-06 NOTE — ED Notes (Signed)
Formatting of this note might be different from the original.  Pt reports BP was elevated at dentist office this morning. HX of controlled HTN. Reports frontal headache 8/10 no N/V. Reports dizziness and syncopal episode in the car. Patient in NAD at this time. Husband at bedside   Electronically signed by Aggie Cosier, MD at 11/08/2021  9:37 AM EDT

## 2021-11-07 ENCOUNTER — Inpatient Hospital Stay
Admit: 2021-11-07 | Discharge: 2021-11-07 | Disposition: A | Discharge status: Home / Self Care | Payer: MEDICAID | Attending: Emergency Medicine

## 2021-11-07 DIAGNOSIS — I10 Essential (primary) hypertension: Secondary | ICD-10-CM

## 2021-11-07 NOTE — ED Notes (Signed)
Pt reports she went to have a dental procedure yesterday and they would not proceed due to pt htn. PT then followed up with PCP who changed her bp medications yesterday. Pt presents today because pt blood pressure still was reading high at home at 171/106.

## 2021-11-07 NOTE — ED Triage Notes (Signed)
Formatting of this note might be different from the original.  Pt reports she went to have a dental procedure yesterday and they would not proceed due to pt htn. PT then followed up with PCP who changed her bp medications yesterday. Pt presents today because pt blood pressure still was reading high at home at 171/106.   Electronically signed by Sanda Klein at 11/07/2021 10:08 AM EDT

## 2021-11-07 NOTE — ED Provider Notes (Signed)
Formatting of this note is different from the original.  HPI 61 year old female with established hypertension was seen in the ED at [facility] yesterday evening for elevated blood pressure complete work-up at that time including CT head scan failed to show any significant abnormalities.  Blood pressure was elevated and first noted that a dental procedure blood pressure medications were changed and the patient has begun taking combination of losartan and amlodipine.  First dose was this morning while at the pharmacy but elevated blood pressure was noted now in ED has no complaints patient" I feel fine".  Specifically no headache chest pain nausea vomiting or focal neurologic symptoms    History reviewed. No pertinent past medical history.    Past Surgical History:   Procedure Laterality Date    HX ORTHOPAEDIC         History reviewed. No pertinent family history.    Social History     Socioeconomic History    Marital status: MARRIED     Spouse name: Not on file    Number of children: Not on file    Years of education: Not on file    Highest education level: Not on file   Occupational History    Not on file   Tobacco Use    Smoking status: Never    Smokeless tobacco: Never   Substance and Sexual Activity    Alcohol use: Not Currently    Drug use: Not on file    Sexual activity: Not on file   Other Topics Concern    Not on file   Social History Narrative    Not on file     Social Determinants of Health     Financial Resource Strain: Not on file   Food Insecurity: Not on file   Transportation Needs: Not on file   Physical Activity: Not on file   Stress: Not on file   Social Connections: Not on file   Intimate Partner Violence: Not on file   Housing Stability: Not on file     ALLERGIES: Aspirin, Celecoxib, Ibuprofen, Lortab [hydrocodone-acetaminophen], Naproxen, Toradol [ketorolac], and Tramadol    Review of Systems   All other systems reviewed and are negative.    Vitals:    11/07/21 1008   BP: (!) 156/78    Pulse: 77   Resp: 19   Temp: 98 F (36.7 C)   SpO2: 96%   Weight: 75.3 kg (166 lb)   Height: 5\' 7"  (1.702 m)       Physical Exam  Vitals and nursing note reviewed.   Constitutional:       General: She is not in acute distress.     Appearance: Normal appearance. She is not ill-appearing, toxic-appearing or diaphoretic.   HENT:      Head: Normocephalic and atraumatic.      Left Ear: Tympanic membrane normal.      Nose: Nose normal.      Mouth/Throat:      Mouth: Mucous membranes are moist.   Eyes:      Extraocular Movements: Extraocular movements intact.      Conjunctiva/sclera: Conjunctivae normal.   Cardiovascular:      Rate and Rhythm: Normal rate and regular rhythm.      Pulses: Normal pulses.      Heart sounds: Normal heart sounds. No murmur heard.  Pulmonary:      Effort: Pulmonary effort is normal. No respiratory distress.      Breath sounds: Normal breath sounds.  No wheezing, rhonchi or rales.   Abdominal:      General: Bowel sounds are normal. There is no distension.      Palpations: Abdomen is soft. There is no mass.      Tenderness: There is no abdominal tenderness. There is no right CVA tenderness, left CVA tenderness, guarding or rebound.      Hernia: No hernia is present.   Musculoskeletal:         General: No swelling, tenderness, deformity or signs of injury. Normal range of motion.      Cervical back: Normal range of motion and neck supple. No rigidity or tenderness.      Right lower leg: No edema.      Left lower leg: No edema.   Lymphadenopathy:      Cervical: No cervical adenopathy.   Skin:     General: Skin is warm and dry.      Capillary Refill: Capillary refill takes less than 2 seconds.      Findings: No erythema, lesion or rash.   Neurological:      General: No focal deficit present.      Mental Status: She is alert and oriented to person, place, and time. Mental status is at baseline.      Cranial Nerves: No cranial nerve deficit.      Sensory: No sensory deficit.      Motor: No weakness.       Gait: Gait normal.   Psychiatric:         Mood and Affect: Mood normal.         Behavior: Behavior normal.         Thought Content: Thought content normal.       Medical Decision Making  Blood pressures here 156/78 152/95.  Reluctant to change blood pressure medications as the patient was just taken her first dose of a new antihypertensive and has no complaints recommend home monitoring follow-up with PCP within 1 week return if symptoms worsen      Procedures          Electronically signed by Addison Lank, MD at 11/07/2021 10:35 AM EDT

## 2021-11-07 NOTE — ED Provider Notes (Signed)
HPI 61 year old female with established hypertension was seen in the ED at [facility] yesterday evening for elevated blood pressure complete work-up at that time including CT head scan failed to show any significant abnormalities.  Blood pressure was elevated and first noted that a dental procedure blood pressure medications were changed and the patient has begun taking combination of losartan and amlodipine.  First dose was this morning while at the pharmacy but elevated blood pressure was noted now in ED has no complaints patient" I feel fine".  Specifically no headache chest pain nausea vomiting or focal neurologic symptoms    History reviewed. No pertinent past medical history.    Past Surgical History:   Procedure Laterality Date    HX ORTHOPAEDIC           History reviewed. No pertinent family history.    Social History     Socioeconomic History    Marital status: MARRIED     Spouse name: Not on file    Number of children: Not on file    Years of education: Not on file    Highest education level: Not on file   Occupational History    Not on file   Tobacco Use    Smoking status: Never    Smokeless tobacco: Never   Substance and Sexual Activity    Alcohol use: Not Currently    Drug use: Not on file    Sexual activity: Not on file   Other Topics Concern    Not on file   Social History Narrative    Not on file     Social Determinants of Health     Financial Resource Strain: Not on file   Food Insecurity: Not on file   Transportation Needs: Not on file   Physical Activity: Not on file   Stress: Not on file   Social Connections: Not on file   Intimate Partner Violence: Not on file   Housing Stability: Not on file         ALLERGIES: Aspirin, Celecoxib, Ibuprofen, Lortab [hydrocodone-acetaminophen], Naproxen, Toradol [ketorolac], and Tramadol    Review of Systems   All other systems reviewed and are negative.    Vitals:    11/07/21 1008   BP: (!) 156/78   Pulse: 77   Resp: 19   Temp: 98 F (36.7 C)    SpO2: 96%   Weight: 75.3 kg (166 lb)   Height: 5\' 7"  (1.702 m)            Physical Exam  Vitals and nursing note reviewed.   Constitutional:       General: She is not in acute distress.     Appearance: Normal appearance. She is not ill-appearing, toxic-appearing or diaphoretic.   HENT:      Head: Normocephalic and atraumatic.      Left Ear: Tympanic membrane normal.      Nose: Nose normal.      Mouth/Throat:      Mouth: Mucous membranes are moist.   Eyes:      Extraocular Movements: Extraocular movements intact.      Conjunctiva/sclera: Conjunctivae normal.   Cardiovascular:      Rate and Rhythm: Normal rate and regular rhythm.      Pulses: Normal pulses.      Heart sounds: Normal heart sounds. No murmur heard.  Pulmonary:      Effort: Pulmonary effort is normal. No respiratory distress.      Breath sounds: Normal breath  sounds. No wheezing, rhonchi or rales.   Abdominal:      General: Bowel sounds are normal. There is no distension.      Palpations: Abdomen is soft. There is no mass.      Tenderness: There is no abdominal tenderness. There is no right CVA tenderness, left CVA tenderness, guarding or rebound.      Hernia: No hernia is present.   Musculoskeletal:         General: No swelling, tenderness, deformity or signs of injury. Normal range of motion.      Cervical back: Normal range of motion and neck supple. No rigidity or tenderness.      Right lower leg: No edema.      Left lower leg: No edema.   Lymphadenopathy:      Cervical: No cervical adenopathy.   Skin:     General: Skin is warm and dry.      Capillary Refill: Capillary refill takes less than 2 seconds.      Findings: No erythema, lesion or rash.   Neurological:      General: No focal deficit present.      Mental Status: She is alert and oriented to person, place, and time. Mental status is at baseline.      Cranial Nerves: No cranial nerve deficit.      Sensory: No sensory deficit.      Motor: No weakness.      Gait: Gait normal.   Psychiatric:          Mood and Affect: Mood normal.         Behavior: Behavior normal.         Thought Content: Thought content normal.        Medical Decision Making  Blood pressures here 156/78 152/95.  Reluctant to change blood pressure medications as the patient was just taken her first dose of a new antihypertensive and has no complaints recommend home monitoring follow-up with PCP within 1 week return if symptoms worsen       Procedures

## 2022-02-08 NOTE — ED Provider Notes (Signed)
Formatting of this note is different from the original.  Initial Provider Assessment    HPI: Pt is a 61 y.o. female with history as listed below who presents to the ED with Headache    Pt here with global migraine. Feels like prior. No red flags. Takes stadol and phenergan at home but has run out.    Past Medical History:   Diagnosis Date    Migraines      No past surgical history on file.    ROS: All other systems negative with exception to what is stated above.    Vitals:    02/08/22 1721   BP: (!) 162/95   BP Location: Right upper arm   Patient Position: Sitting   Pulse: 82   Resp: 16   Temp: 36.9 C (98.4 F)   TempSrc: Tympanic   SpO2: 99%   Weight: 76.1 kg (167 lb 12.3 oz)     Focused Physical Exam:  Gen: No Acute Distress  HEENT: Normocephalic, atraumatic, extraocular movements intact  CV: Regular rate and rhythm  Lung: No respiratory distress  Abd: Soft, non-tender, non-distended  Neuro: Alert and awake, moving all extremities well    Medical Decision Making and Plan: Given the patient?s initial provider assessment, the following diagnostic evaluation and therapeutic interventions have been ordered. The patient will be placed in the appropriate treatment space, once one is available, to complete the evaluation and treatment.  I have discussed the plan of care with the patient.  No orders of the defined types were placed in this encounter.    Treatments:   Medications   ketorolac (TORADOL) injection 15 mg (has no administration in time range)   prochlorperazine (COMPAZINE) injection 10 mg (has no administration in time range)   diphenhydrAMINE (BENADRYL) capsule 25 mg (has no administration in time range)     Evon Slack, MD  Emergency Medicine      Desiree Lucy August, MD  02/08/22 1906    Electronically signed by Desiree Lucy August, MD at 02/08/2022  7:06 PM EDT

## 2022-02-08 NOTE — ED Triage Notes (Signed)
Formatting of this note might be different from the original.  Pt ambulatory to triage with c/o 'really bad migraine'; Pt states she has run out of her stadol and unable to get to PCP until 03/2022 as provider is out until then. Pt requesting assistance with refills and current pain mgmt. Pt usually takes phengran prior to stadol nasal spray  Electronically signed by Jodie Echevaria, RN at 02/08/2022  5:27 PM EDT

## 2022-02-08 NOTE — Progress Notes (Signed)
Formatting of this note might be different from the original.  AMA Note     Chart reviewed by PA Ramos, who recommends no changes in treatment or need to return at this time. No additional resource needs at this time.   Electronically signed by Leonard Downing, RN at 02/09/2022 12:09 PM EDT

## 2022-07-09 ENCOUNTER — Inpatient Hospital Stay
Admit: 2022-07-09 | Discharge: 2022-07-09 | Disposition: A | Discharge status: Home / Self Care | Payer: MEDICAID | Attending: Emergency Medicine

## 2022-07-09 DIAGNOSIS — G43709 Chronic migraine without aura, not intractable, without status migrainosus: Secondary | ICD-10-CM

## 2022-07-09 MED ORDER — KETOROLAC TROMETHAMINE 15 MG/ML IJ SOLN
15 MG/ML | Freq: Once | INTRAMUSCULAR | Status: DC
Start: 2022-07-09 — End: 2022-07-09

## 2022-07-09 MED ORDER — DIPHENHYDRAMINE HCL 50 MG/ML IJ SOLN
50 MG/ML | INTRAMUSCULAR | Status: DC
Start: 2022-07-09 — End: 2022-07-09

## 2022-07-09 MED ORDER — SODIUM CHLORIDE 0.9 % IV BOLUS
0.9 % | Freq: Once | INTRAVENOUS | Status: DC
Start: 2022-07-09 — End: 2022-07-09

## 2022-07-09 MED ORDER — NALBUPHINE HCL 10 MG/ML IJ SOLN
10 MG/ML | Freq: Once | INTRAMUSCULAR | Status: DC
Start: 2022-07-09 — End: 2022-07-09

## 2022-07-09 MED ORDER — METOCLOPRAMIDE HCL 5 MG/ML IJ SOLN
5 MG/ML | Freq: Four times a day (QID) | INTRAMUSCULAR | Status: DC
Start: 2022-07-09 — End: 2022-07-09

## 2022-07-09 MED ORDER — BUTORPHANOL TARTRATE 10 MG/ML NA SOLN
10 MG/ML | Freq: Four times a day (QID) | NASAL | 0 refills | Status: AC | PRN
Start: 2022-07-09 — End: 2022-07-15

## 2022-07-09 MED ORDER — BUTORPHANOL TARTRATE 1 MG/ML IJ SOLN
1 MG/ML | Freq: Once | INTRAMUSCULAR | Status: AC
Start: 2022-07-09 — End: 2022-07-09
  Administered 2022-07-09: 21:00:00 2 mg via INTRAMUSCULAR

## 2022-07-09 MED FILL — SODIUM CHLORIDE 0.9 % IV SOLN: 0.9 % | INTRAVENOUS | Qty: 1000

## 2022-07-09 MED FILL — NALBUPHINE HCL 10 MG/ML IJ SOLN: 10 MG/ML | INTRAMUSCULAR | Qty: 1

## 2022-07-09 MED FILL — BUTORPHANOL TARTRATE 1 MG/ML IJ SOLN: 1 MG/ML | INTRAMUSCULAR | Qty: 2

## 2022-07-09 NOTE — ED Notes (Signed)
MD advises pt does not want an IV     Laurie Catalan, RN  07/09/22 [phone]

## 2022-07-09 NOTE — ED Notes (Signed)
MD made aware pt requesting stadol nasal spray prescription for home- MD aware     Darnelle Catalan, RN  07/09/22 1646

## 2022-07-09 NOTE — ED Provider Notes (Signed)
[facility] EMERGENCY DEPT  EMERGENCY DEPARTMENT HISTORY AND PHYSICAL EXAM      Date: 07/09/2022  Patient Name: Laurie Miller  MRN: [id number]  Birthdate 02/10/1961  Date of evaluation: 07/09/2022  Provider: Aggie Cosier, MD   Note Started: 3:45 PM EST 07/09/22    HISTORY OF PRESENT ILLNESS     Chief Complaint   Patient presents with    Headache       History Provided By: Patient    HPI: Laurie Miller is a 61 y.o. female with three days of intermittent migraine that has been constant this afternoon. No improvement with dark room. Took motrin at home without relief. States usually has Statol and phenergan but ran out.     Nausea but no vomiting    PAST MEDICAL HISTORY   Past Medical History:  History reviewed. No pertinent past medical history.    Past Surgical History:  Past Surgical History:   Procedure Laterality Date    ORTHOPEDIC SURGERY         Family History:  History reviewed. No pertinent family history.    Social History:  Social History     Tobacco Use    Smoking status: Never    Smokeless tobacco: Never   Substance Use Topics    Alcohol use: Not Currently       Allergies:  Allergies   Allergen Reactions    Aspirin Nausea Only    Celecoxib Nausea Only    Hydrocodone-Acetaminophen Nausea Only    Ibuprofen Nausea Only    Ketorolac Nausea Only    Naproxen Nausea Only    Tramadol Nausea Only       PCP: No primary care provider on file.    Current Meds:   No current facility-administered medications for this encounter.     Current Outpatient Medications   Medication Sig Dispense Refill    acetaminophen (TYLENOL) 325 MG tablet Take 650 mg by mouth every 4 hours as needed      butorphanol (STADOL) 10 MG/ML nasal spray use ONE SPRAY intranasal if no RELIEF in 60-90 minutes,second DOSE MAY be given MAY REPEAT in 3 TO 4 HOURS AS NEEDED      diazePAM (VALIUM) 2 MG tablet Take 5 mg by mouth every 8 hours as needed.      ketorolac (TORADOL) 10 MG tablet Take 10 mg by mouth every 6 hours as needed      Lidocaine 3.5 % PTCH Apply 1  patch topically      promethazine (PHENERGAN) 50 MG/ML injection by NOT APPLICABLE route         Social Determinants of Health:   Social Determinants of Health     Tobacco Use: Low Risk  (07/09/2022)    Patient History     Smoking Tobacco Use: Never     Smokeless Tobacco Use: Never     Passive Exposure: Not on file   Alcohol Use: Not on file   Financial Resource Strain: Not on file   Food Insecurity: Not on file   Transportation Needs: Not on file   Physical Activity: Not on file   Stress: Not on file   Social Connections: Not on file   Intimate Partner Violence: Not on file   Depression: Not on file   Housing Stability: Not on file   Interpersonal Safety: Not on file   Utilities: Not on file       PHYSICAL EXAM   Physical Exam  Vitals and nursing note reviewed.   Constitutional:  Appearance: Normal appearance.   HENT:      Head: Normocephalic and atraumatic.      Right Ear: External ear normal.      Left Ear: External ear normal.      Nose: Nose normal.      Mouth/Throat:      Mouth: Mucous membranes are moist.   Eyes:      Extraocular Movements: Extraocular movements intact.      Pupils: Pupils are equal, round, and reactive to light.   Cardiovascular:      Rate and Rhythm: Normal rate and regular rhythm.   Pulmonary:      Effort: Pulmonary effort is normal.   Abdominal:      Palpations: Abdomen is soft.   Musculoskeletal:         General: Normal range of motion.      Cervical back: Normal range of motion.   Skin:     General: Skin is warm.      Capillary Refill: Capillary refill takes less than 2 seconds.   Neurological:      General: No focal deficit present.      Mental Status: She is alert and oriented to person, place, and time. Mental status is at baseline.      Cranial Nerves: No cranial nerve deficit.      Motor: No weakness.   Psychiatric:         Mood and Affect: Mood normal.         Behavior: Behavior normal.            SCREENINGS                      EMERGENCY DEPARTMENT COURSE and DIFFERENTIAL  DIAGNOSIS/MDM   CC/HPI Summary, DDx, ED Course, and Reassessment: 62yo F presenting to ed with migraine consistent with prior. Is requesting only dose of stadol after which she states her hadache completely resolved.         Records Reviewed (source and summary of external notes): Prior medical records and Nursing notes    Vitals:    Vitals:    07/09/22 1534   BP: 126/74   Pulse: 81   Resp: 18   SpO2: 100%   Weight: 75.3 kg (166 lb)   Height: 1.727 m (5\' 8" )        ED COURSE  ED Course as of 07/11/22 1615   Fri Jul 09, 2022   1558 Declines iv migraine cocktail. States only wants stadol or Nubaine IM .  [MC]   1636 Headache resolved will dc w outpatient resources.  [MC]      ED Course User Index  [MC] Jul 11, 2022, MD       Disposition Considerations (Tests not done, Shared Decision Making, Pt Expectation of Test or Treatment.): Not Applicable    Patient was given the following medications:  Medications - No data to display    CONSULTS: (Who and What was discussed)  None     Social Determinants affecting Dx or Tx: None    Smoking Cessation: Not Applicable    PROCEDURES   Unless otherwise noted above, none  Procedures      CRITICAL CARE TIME   Patient does not meet Critical Care Time, 0 minutes    ED FINAL IMPRESSION     1. Chronic migraine without aura without status migrainosus, not intractable          DISPOSITION/PLAN   DISPOSITION  Discharge Note: The patient is stable for discharge home. The signs, symptoms, diagnosis, and discharge instructions have been discussed, understanding conveyed, and agreed upon. The patient is to follow up as recommended or return to ER should their symptoms worsen.      PATIENT REFERRED TO:  [facility] [address] [address]  [phone]  Schedule an appointment as soon as possible for a visit   for followup    [address]ana H[address], [address] [place]  [address]  [phone]    Schedule an appointment as soon as possible for a visit   for followup        DISCHARGE MEDICATIONS:     Medication List        ASK your doctor about these medications      acetaminophen 325 MG tablet  Commonly known as: TYLENOL     butorphanol 10 MG/ML nasal spray  Commonly known as: STADOL     diazePAM 2 MG tablet  Commonly known as: VALIUM     ketorolac 10 MG tablet  Commonly known as: TORADOL     Lidocaine 3.5 % Ptch     promethazine 50 MG/ML injection  Commonly known as: PHENERGAN                DISCONTINUED MEDICATIONS:  Current Discharge Medication List          I am the Primary Clinician of Record. Brayton Layman, MD (electronically signed)    (Please note that parts of this dictation were completed with voice recognition software. Quite often unanticipated grammatical, syntax, homophones, and other interpretive errors are inadvertently transcribed by the computer software. Please disregards these errors. Please excuse any errors that have escaped final proofreading.)      Tonette Bihari, MD  07/11/22 [phone]

## 2022-07-09 NOTE — ED Triage Notes (Signed)
Patient reports a migraine and has history of same it has been off and on since 2 weeks ago. Patient is out of migraine medications and hasn't had any refills of her statol or phenergan.

## 2022-09-05 ENCOUNTER — Inpatient Hospital Stay: Admit: 2022-09-05 | Discharge: 2022-09-05 | Payer: MEDICAID

## 2022-09-05 DIAGNOSIS — Z5321 Procedure and treatment not carried out due to patient leaving prior to being seen by health care provider: Secondary | ICD-10-CM

## 2022-09-05 NOTE — ED Triage Notes (Signed)
Pt reported a productive cough that started Friday pt stated "its not Covid or anything like that" pt denies any other symptoms, pt requesting nasal spray be sent to the pharmacy

## 2022-09-28 ENCOUNTER — Inpatient Hospital Stay
Admit: 2022-09-28 | Discharge: 2022-09-28 | Disposition: A | Discharge status: Home / Self Care | Payer: MEDICAID | Attending: Emergency Medicine

## 2022-09-28 DIAGNOSIS — J0110 Acute frontal sinusitis, unspecified: Secondary | ICD-10-CM

## 2022-09-28 MED ORDER — OXYCODONE-ACETAMINOPHEN 5-325 MG PO TABS
5-325 | ORAL | Status: AC
Start: 2022-09-28 — End: 2022-09-28
  Administered 2022-09-28: 16:00:00 1 via ORAL

## 2022-09-28 MED ORDER — CEPHALEXIN 500 MG PO CAPS
500 | ORAL_CAPSULE | Freq: Four times a day (QID) | ORAL | 0 refills | Status: AC
Start: 2022-09-28 — End: 2022-10-05

## 2022-09-28 MED ORDER — ACETAMINOPHEN 500 MG PO TABS
500 | ORAL_TABLET | Freq: Four times a day (QID) | ORAL | 1 refills | Status: AC | PRN
Start: 2022-09-28 — End: ?

## 2022-09-28 MED ORDER — PREDNISONE 20 MG PO TABS
20 | ORAL | Status: AC
Start: 2022-09-28 — End: 2022-09-28
  Administered 2022-09-28: 16:00:00 60 mg via ORAL

## 2022-09-28 MED ORDER — HYDROXYZINE PAMOATE 25 MG PO CAPS
25 | ORAL_CAPSULE | Freq: Three times a day (TID) | ORAL | 0 refills | Status: AC | PRN
Start: 2022-09-28 — End: 2022-10-03

## 2022-09-28 MED ORDER — CEPHALEXIN 250 MG PO CAPS
250 | ORAL | Status: AC
Start: 2022-09-28 — End: 2022-09-28
  Administered 2022-09-28: 16:00:00 500 mg via ORAL

## 2022-09-28 MED ORDER — FLUTICASONE PROPIONATE 50 MCG/ACT NA SUSP
50 | Freq: Every day | NASAL | 0 refills | Status: AC
Start: 2022-09-28 — End: ?

## 2022-09-28 MED ORDER — PREDNISONE 20 MG PO TABS
20 | ORAL_TABLET | Freq: Every day | ORAL | 0 refills | Status: AC
Start: 2022-09-28 — End: 2022-10-03

## 2022-09-28 MED ORDER — LORATADINE 10 MG PO TABS
10 | ORAL_TABLET | Freq: Every day | ORAL | 0 refills | Status: AC
Start: 2022-09-28 — End: 2022-10-08

## 2022-09-28 MED FILL — OXYCODONE-ACETAMINOPHEN 5-325 MG PO TABS: 5-325 MG | ORAL | Qty: 1

## 2022-09-28 MED FILL — CEPHALEXIN 250 MG PO CAPS: 250 MG | ORAL | Qty: 2

## 2022-09-28 MED FILL — PREDNISONE 20 MG PO TABS: 20 MG | ORAL | Qty: 3
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
## 2022-09-28 NOTE — ED Notes (Signed)
Pt given discharge and follow up instructions. Pt advised to follow with PCP and education on prescriptions. No further questions at this time.

## 2022-09-28 NOTE — ED Triage Notes (Signed)
Pt reports sinus pain and pressure as well as cough, HA and congestion x 1 week.

## 2022-11-07 DIAGNOSIS — G43809 Other migraine, not intractable, without status migrainosus: Secondary | ICD-10-CM

## 2022-11-07 NOTE — Discharge Instructions (Signed)
Thank you!  Thank you for allowing me to care for you in the emergency department. It is my goal to provide you with excellent care.  Please fill out the survey that will come to you by mail or email since we listen to your feedback!     Below you will find a list of your tests from today's visit.  Should you have any questions, please do not hesitate to call the emergency department.    Labs  No results found for this or any previous visit (from the past 12 hour(s)).    Radiologic Studies  No orders to display     ------------------------------------------------------------------------------------------------------------  The exam and treatment you received in the Emergency Department were for an urgent problem and are not intended as complete care. It is important that you follow-up with a doctor, nurse practitioner, or physician assistant to:  (1) confirm your diagnosis,  (2) re-evaluation of changes in your illness and treatment, and (3) for ongoing care. Please take your discharge instructions with you when you go to your follow-up appointment.     If you have any problem arranging a follow-up appointment, contact the Emergency Department.  If your symptoms become worse or you do not improve as expected and you are unable to reach your health care provider, please return to the Emergency Department. We are available 24 hours a day.     If a prescription has been provided, please have it filled as soon as possible to prevent a delay in treatment. If you have any questions or reservations about taking the medication due to side effects or interactions with other medications, please call your primary care provider or contact the ER.

## 2022-11-07 NOTE — ED Notes (Signed)
Patient stable at time of discharge. Reviewed discharge instructions and education. Patient verbalized understanding. Patient states no questions at this time.

## 2022-11-07 NOTE — ED Provider Notes (Signed)
[facility]  EMERGENCY DEPARTMENT ENCOUNTER NOTE    Date: 11/07/2022  Patient Name: Laurie Miller    History of Presenting Illness     Chief Complaint   Patient presents with    Joint Swelling    Back Pain    Migraine       History obtained from: Patient    HPI: Laurie Miller, 62 y.o. female with past medical history as listed and reviewed below presenting with multiple symptoms.  Notes of diffuse gradual headache that is consistent with previous typical migraines, intermittent bilateral lower extremity swelling, and chronic back pain.  Notes that she is on amlodipine for high blood pressure.    Patient reports a diffuse gradual headache that started this afternoon.  Headache is not associated with any photophobia, vision changes, slurred speech, numbness or weakness in the upper or lower extremities, neck stiffness, rashes, fevers, chills, nausea, vomiting, vertigo, confusion, or altered mental status.  No trauma.     - History of recurrent headaches?: yes    Medical History   I reviewed the medical, surgical, family, and social history, as well as allergies:    PCP: No primary care provider on file.    Past Medical History:  Past Medical History:   Diagnosis Date    Bursitis     CKD (chronic kidney disease)     Fatty tumor     Hypertension      Past Surgical History:  Past Surgical History:   Procedure Laterality Date    ORTHOPEDIC SURGERY       Current Outpatient Medications:  Current Outpatient Medications   Medication Instructions    acetaminophen (TYLENOL) 500 mg, Oral, EVERY 6 HOURS PRN    butalbital-APAP-caffeine (FIORICET) 50-300-40 MG CAPS per capsule 1 capsule, Oral, EVERY 4 HOURS PRN    diazePAM (VALIUM) 5 mg, Oral, EVERY 8 HOURS PRN    fluticasone (FLONASE) 50 MCG/ACT nasal spray 2 sprays, Each Nostril, DAILY    ketorolac (TORADOL) 10 mg, Oral, EVERY 6 HOURS PRN    Lidocaine 3.5 % PTCH 1 patch, Topical    promethazine (PHENERGAN) 50 MG/ML injection NOT APPLICABLE       Family History:  History reviewed. No pertinent family history.  Social History:  Social History     Tobacco Use    Smoking status: Never    Smokeless tobacco: Never   Substance Use Topics    Alcohol use: Not Currently     Allergies:  Allergies   Allergen Reactions    Aspirin Nausea Only    Celecoxib Nausea Only    Hydrocodone-Acetaminophen Nausea Only    Ibuprofen Nausea Only    Ketorolac Nausea Only    Meperidine Itching    Naproxen Nausea Only    Nsaids     Oxycodone-Acetaminophen Itching    Tramadol Nausea Only    Clarithromycin Rash    Penicillins Angioedema and Rash     Hemorrhage    hemorrhage    Sulfa Antibiotics Hives and Rash     Other Reaction(s): Itchy, rash       Review of Systems     Positives and pertinent negatives as per HPI, otherwise negative ROS.    Physical Exam and Vital Signs   Vital Signs - Reviewed the patient's vital signs.    Vitals:    11/07/22 2152   BP: (!) 156/97   Pulse: 68   Resp: 19   Temp: 97.6 F (36.4 C)  TempSrc: Oral   SpO2: 98%   Weight: 78.2 kg (172 lb 6.4 oz)   Height: 1.727 m (5\' 8" )     Physical Exam:    GENERAL: awake, alert, cooperative, not in distress  HEENT:  * Pupils equal, EOMI  * Head atraumatic  CV:  * audible heart sounds  * warm and perfused extremities bilaterally  PULMONARY: Good air movement, no wheezes, no crackles  ABDOMEN/GU: soft, no distension, no guarding, no abdominal tenderness, murphy negative, Mcburney negative, Rovsig negative, no masses, no CVA tenderness.  EXTREMITIES/BACK: warm and perfused, no tenderness, no edema, no signs of DVT (warmth, asymmetric swelling, erythema, or calf tenderness).  SKIN: no rashes or signs of trauma  NEURO:   * GCS = 15 (E=4, V=5, M=6)  * Awake, alert, oriented x 3, normal speech  * CNs II-XII intact  * Strength 5/5 in all extremities  * Sensory exam grossly normal  * No pronator drift or dysmetria  * Gaze conjugate, no nystagmus  * Gait normal without instability  * NIHSS 0    Medical Decision Making     Patient  is a 62 y.o. female presenting for a headache. Vitals reveal no significant abnormalities and physical exam reveals no significant abnormalities. Based on the history, physical exam, risk factors, and vital signs, I favor the following differential diagnoses: tension headache, migraine headache, cluster headache, simple primary headache. Differentials may also include caffeine withdrawal headache and nicotine withdrawal headache if the patient had use of these substances.    Patient's historical features are not consistent with the more malignant etiologies such as ICH, NPH, pseudotumor cerebri, trauma, meningitis, hydrocephalus, malignancy, carbon monoxide poisoning. Patient has no meningeal signs on physical exam and the patient's symptoms are consistent with a primary non-malignant headache. Patient is neurologically intact. I do not believe a lumbar puncture is warranted at this time on account of the patient's historical features and current symptoms.    Bilateral lower extremity swelling is likely secondary to amlodipine side effect.  Patient with no visible swelling on physical exam here today.  No overlying rash.  Advised to follow-up with primary care to switch medication for high blood pressure.    Patient also complaining of chronic back pain, states it is similar in character and severity, denies any new injury or trauma.  No red flags.  Able to ambulate without difficulty.  Denies any fever, urinary symptoms, weakness or numbness.     See ED Course and Reassessment for evaluation and discussion.    Records Reviewed: Nursing Notes and Old Medical Records  Social Determinants of health affecting management: None    ED Course and Reassessment     ED Course:          Reassessment:    Patient presents with what appears to be a primary headache. There was improvement of symptoms in the emergency department.    Patient's historical features are not consistent with the more malignant etiologies such as  meningitis or intracranial hemorrhage/SAH. Patient has no meningeal signs on physical exam and the patient's symptoms are consistent with a primary non-malignant headache. Patient is neurologically intact. In addition, SAH screening score was low. I do not believe a lumbar puncture is warranted at this time on account of the patient's historical features and current symptoms suggest a primary headache.     Understanding was insured that at this time there is no evidence for a more malignant underlying process, but that early in the process of  an illness, an emergency department workup can be falsely reassuring.     Routine discharge counseling was given including the fact that any worsening, changing or persistent symptoms should prompt an immediate call or follow up with their primary physician or the emergency department. The importance of appropriate follow up was also discussed. More extensive discharge instructions were given in the patient's discharge paperwork.    After completion of evaluation and discussion of results and diagnoses, all the questions were answered. If required, all follow up appointments and treatments were discussed and explained. Understanding was insured prior to discharge.    Diagnosis     Clinical Impression:   1. Other migraine without status migrainosus, not intractable    2. Peripheral edema    3. Chronic back pain, unspecified back location, unspecified back pain laterality        Final Disposition     DISPOSITION: DISCHARGED    Patient will be discharged from the Emergency Department in stable condition. All of the diagnostic tests were reviewed and any questions were answered. Diagnosis, results, follow up if applicable, and return precautions were discussed. I have also put together printed discharge instructions for them that include: 1) educational information regarding their diagnosis, 2) how to care for their diagnosis at home, as well a 3) list of reasons why they would  want to return to the ED prior to their follow-up appointment, should their condition change. Any labs or imaging done in the ED will be either printed with the discharge paperwork or available through [facility].    DISCHARGE PLAN:  1.   Discharge Medication List as of 11/07/2022 10:48 PM        START taking these medications    Details   butalbital-APAP-caffeine (FIORICET) 50-300-40 MG CAPS per capsule Take 1 capsule by mouth every 4 hours as needed for Headaches, Disp-84 capsule, R-0Normal           CONTINUE these medications which have NOT CHANGED    Details   acetaminophen (TYLENOL) 500 MG tablet Take 1 tablet by mouth every 6 hours as needed for Pain, Disp-30 tablet, R-1Normal      fluticasone (FLONASE) 50 MCG/ACT nasal spray 2 sprays by Each Nostril route daily, Disp-16 g, R-0Normal      diazePAM (VALIUM) 2 MG tablet Take 5 mg by mouth every 8 hours as needed.Historical Med      ketorolac (TORADOL) 10 MG tablet Take 10 mg by mouth every 6 hours as neededHistorical Med      Lidocaine 3.5 % PTCH Apply 1 patch topicallyHistorical Med      promethazine (PHENERGAN) 50 MG/ML injection by NOT APPLICABLE routeHistorical Med            2. [facility] EMERGENCY DEPT  [place] [id number]    If symptoms worsen    [facility]  [address], [address]  [place] [phone]  [phone]  In 1 week      3.  Return to ED if worse    4.      Medication List        START taking these medications      butalbital-APAP-caffeine 50-300-40 MG Caps per capsule  Commonly known as: Fioricet  Take 1 capsule by mouth every 4 hours as needed for Headaches            ASK your doctor about these medications  acetaminophen 500 MG tablet  Commonly known as: TYLENOL  Take 1 tablet by mouth every 6 hours as needed for Pain     diazePAM 2 MG tablet  Commonly known as: VALIUM     fluticasone 50 MCG/ACT nasal spray  Commonly known as: FLONASE  2 sprays by  Each Nostril route daily     ketorolac 10 MG tablet  Commonly known as: TORADOL     Lidocaine 3.5 % Ptch     promethazine 50 MG/ML injection  Commonly known as: PHENERGAN               Where to Get Your Medications        These medications were sent to [facility], [address]  [place], [place] [address]      Phone: [phone]   butalbital-APAP-caffeine 50-300-40 MG Caps per capsule         Procedures, Critical Care, & Clinical Tools   Performed by: Virgel Bouquet, DO  Procedures     Not Applicable     Results, Consults, Medications     Consults:  None   Labs:  No results found for this or any previous visit (from the past 12 hour(s)).  Radiologic Studies:  No orders to display     Medications ordered:  Medications   butalbital-acetaminophen-caffeine (FIORICET, ESGIC) per tablet 1 tablet (1 tablet Oral Given 11/07/22 2248)       Documentation Comments   - I am the first and primary provider for this patient and am the primary provider of record.  - Initial assessment performed. The patients presenting problems have been discussed, and the staff are in agreement with the care plan formulated and outlined with them.  I have encouraged them to ask questions as they arise throughout their visit.  - Available medical records, nursing notes, old EKGs, and EMS run sheets (if patient was EMS transported) were reviewed    Please note that this dictation was completed with Dragon, the computer voice recognition software.  Quite often unanticipated grammatical, syntax, homophones, and other interpretive errors are inadvertently transcribed by the computer software.  Please disregard these errors.  Please excuse any errors that have escaped final proofreading.     Virgel Bouquet, DO  11/07/22 2339

## 2022-11-07 NOTE — ED Triage Notes (Signed)
Patient presents c/o bilateral ankle swelling. Patient family states the swelling was noted yesterday and has gotten worse.

## 2022-11-08 ENCOUNTER — Inpatient Hospital Stay
Admit: 2022-11-08 | Discharge: 2022-11-08 | Disposition: A | Discharge status: Home / Self Care | Payer: MEDICAID | Attending: Student in an Organized Health Care Education/Training Program

## 2022-11-08 MED ORDER — BUTALBITAL-APAP-CAFFEINE 50-300-40 MG PO CAPS
50-300-40 | ORAL_CAPSULE | ORAL | 0 refills | Status: AC | PRN
Start: 2022-11-08 — End: ?

## 2022-11-08 MED ORDER — BUTALBITAL-APAP-CAFFEINE 50-325-40 MG PO TABS
50-325-40 | ORAL | Status: AC
Start: 2022-11-08 — End: 2022-11-07
  Administered 2022-11-08: 03:00:00 1 via ORAL

## 2022-11-08 MED FILL — BUTALBITAL-APAP-CAFFEINE 50-325-40 MG PO TABS: 50-325-40 MG | ORAL | Qty: 1

## 2022-12-12 ENCOUNTER — Inpatient Hospital Stay
Admit: 2022-12-12 | Discharge: 2022-12-12 | Disposition: A | Discharge status: Home / Self Care | Payer: MEDICAID | Attending: Emergency Medicine

## 2022-12-12 DIAGNOSIS — K0889 Other specified disorders of teeth and supporting structures: Secondary | ICD-10-CM

## 2022-12-12 DIAGNOSIS — S0083XA Contusion of other part of head, initial encounter: Secondary | ICD-10-CM

## 2022-12-12 MED ORDER — ONDANSETRON HCL 4 MG PO TABS
4 MG | ORAL_TABLET | Freq: Three times a day (TID) | ORAL | 0 refills | Status: DC | PRN
Start: 2022-12-12 — End: 2023-11-07

## 2022-12-12 MED ORDER — ONDANSETRON 4 MG PO TBDP
4 | Freq: Once | ORAL | Status: AC
Start: 2022-12-12 — End: 2022-12-12
  Administered 2022-12-12: 16:00:00 4 mg via SUBLINGUAL

## 2022-12-12 MED ORDER — DIPHENHYDRAMINE HCL 25 MG PO TABS
25 | ORAL | Status: AC
Start: 2022-12-12 — End: 2022-12-12
  Administered 2022-12-12: 16:00:00 50 mg via ORAL

## 2022-12-12 MED ORDER — TRAMADOL HCL 50 MG PO TABS
50 | Freq: Once | ORAL | Status: DC
Start: 2022-12-12 — End: 2022-12-12

## 2022-12-12 MED ORDER — OXYCODONE HCL 5 MG PO TABS
5 | ORAL | Status: AC
Start: 2022-12-12 — End: 2022-12-12
  Administered 2022-12-12: 16:00:00 5 mg via ORAL

## 2022-12-12 MED ORDER — IBUPROFEN 600 MG PO TABS
600 | ORAL_TABLET | Freq: Four times a day (QID) | ORAL | 0 refills | Status: AC | PRN
Start: 2022-12-12 — End: 2022-12-17

## 2022-12-12 MED FILL — ONDANSETRON 4 MG PO TBDP: 4 MG | ORAL | Qty: 1

## 2022-12-12 MED FILL — DIPHENHYDRAMINE HCL 25 MG PO TABS: 25 MG | ORAL | Qty: 2

## 2022-12-12 MED FILL — OXYCODONE HCL 5 MG PO TABS: 5 MG | ORAL | Qty: 1

## 2022-12-12 NOTE — ED Triage Notes (Signed)
Pt had 6 teeth pulled on the top and 5 on the bottom on Thursday, pt now with 10/10 pain in her mouth pt taking her abx but is out of pain meds

## 2022-12-12 NOTE — ED Provider Notes (Incomplete)
[facility] EMERG[facility]Y DEPT  EMERGENCY DEPARTMENT HISTORY AND PHYSICAL EXAM      Date: 12/12/2022  Patient Name: Laurie Miller  MRN: [id number]  Birthdate: 24-Jan-1961  Date of evaluation: 12/12/2022  Provider: Domingo Dimes, MD   Note Started: 11:19 AM EDT 12/12/22    HISTORY OF PRESENT ILLNESS     Chief Complaint   Patient presents with    Dental Pain       History Provided By: Patient    HPI: Laurie Miller is a 62 y.o. female     PAST MEDICAL HISTORY   Past Medical History:  Past Medical History:   Diagnosis Date    Bursitis     CKD (chronic kidney disease)     Fatty tumor     Hypertension        Past Surgical History:  Past Surgical History:   Procedure Laterality Date    ORTHOPEDIC SURGERY         Family History:  History reviewed. No pertinent family history.    Social History:  Social History     Tobacco Use    Smoking status: Never    Smokeless tobacco: Never   Substance Use Topics    Alcohol use: Not Currently       Allergies:  Allergies   Allergen Reactions    Aspirin Nausea Only    Celecoxib Nausea Only    Hydrocodone-Acetaminophen Nausea Only    Ibuprofen Nausea Only    Ketorolac Nausea Only    Meperidine Itching    Naproxen Nausea Only    Nsaids     Oxycodone-Acetaminophen Itching    Tramadol Nausea Only    Clarithromycin Rash    Penicillins Angioedema and Rash     Hemorrhage    hemorrhage    Sulfa Antibiotics Hives and Rash     Other Reaction(s): Itchy, rash       PCP: No primary care provider on file.    Current Meds:   No current facility-administered medications for this encounter.     Current Outpatient Medications   Medication Sig Dispense Refill    butalbital-APAP-caffeine (FIORICET) 50-300-40 MG CAPS per capsule Take 1 capsule by mouth every 4 hours as needed for Headaches 84 capsule 0    acetaminophen (TYLENOL) 500 MG tablet Take 1 tablet by mouth every 6 hours as needed for Pain 30 tablet 1    fluticasone (FLONASE) 50 MCG/ACT nasal spray 2 sprays by Each Nostril route daily 16 g 0    diazePAM (VALIUM) 2 MG  tablet Take 5 mg by mouth every 8 hours as needed.      ketorolac (TORADOL) 10 MG tablet Take 10 mg by mouth every 6 hours as needed      Lidocaine 3.5 % PTCH Apply 1 patch topically      promethazine (PHENERGAN) 50 MG/ML injection by NOT APPLICABLE route         Social Determinants of Health:   Social Determinants of Health     Tobacco Use: Low Risk  (12/12/2022)    Patient History     Smoking Tobacco Use: Never     Smokeless Tobacco Use: Never     Passive Exposure: Not on file   Alcohol Use: Not on file   Financial Resource Strain: Not on file   Food Insecurity: Not on file   Transportation Needs: Not on file   Physical Activity: Not on file   Stress: Not on file   Social Connections: Not on file  Intimate Partner Violence: Not on file   Depression: Not on file   Housing Stability: Not on file   Interpersonal Safety: Not on file   Utilities: Not on file       PHYSICAL EXAM   Physical Exam  Vitals and nursing note reviewed.   Constitutional:       General: She is in acute distress.      Appearance: She is not ill-appearing, toxic-appearing or diaphoretic.   HENT:      Head: Normocephalic and atraumatic.      Jaw: There is normal jaw occlusion. No tenderness.        Mouth/Throat:      Dentition: Abnormal dentition. Dental tenderness present. No gingival swelling or dental abscesses.           SCREENINGS              LAB, EKG AND DIAGNOSTIC RESULTS   Labs:  No results found for this or any previous visit (from the past 12 hour(s)).    EKG: Not Applicable    Radiologic Studies:  Non-plain film images such as CT, Ultrasound and MRI are read by the radiologist. Plain radiographic images are visualized and preliminarily interpreted by the ED Physician with the following findings: Not Applicable.    Interpretation per the Radiologist below, if available at the time of this note:  No orders to display        ED COURSE and DIFFERENTIAL DIAGNOSIS/MDM   CC/HPI Summary, DDx, ED Course, and Reassessment: 62 year old female  presents with a complaint of pain to both upper and lower jaws, patient 3 days status post extraction of multiple teeth, patient complaining of pain to lower jaw and chin    Records Reviewed (source and summary of external notes): Prior medical records and Nursing notes    Vitals:    Vitals:    12/12/22 1102   BP: (!) 172/75   Pulse: 67   Resp: 18   Temp: 97.3 F (36.3 C)   SpO2: 98%   Weight: 78 kg (172 lb)   Height: 1.727 m (5\' 8" )        ED COURSE  Oxycodone p.o.       Disposition Considerations (Tests not done, Shared Decision Making, Pt Expectation of Test or Treatment.): {Dispo considerations:59518}    Patient was given the following medications:  Medications - No data to display    CONSULTS: (Who and What was discussed)  None     Social Determinants affecting Dx or Tx: None    Smoking Cessation: Not Applicable    PROCEDURES   Unless otherwise noted above, none.  Procedures      CRITICAL CARE TIME   Patient does not meet Critical Care Time, 0 minutes    FINAL IMPRESSION   No diagnosis found.      DISPOSITION/PLAN   DISPOSITION      Discharge Note: The patient is stable for discharge home. The signs, symptoms, diagnosis, and discharge instructions have been discussed, understanding conveyed, and agreed upon. The patient is to follow up as recommended or return to ER should their symptoms worsen.      PATIENT REFERRED TO:  No follow-up provider specified.      DISCHARGE MEDICATIONS:     Medication List        ASK your doctor about these medications      acetaminophen 500 MG tablet  Commonly known as: TYLENOL  Take 1 tablet by mouth every 6 hours as  needed for Pain     butalbital-APAP-caffeine 50-300-40 MG Caps per capsule  Commonly known as: Fioricet  Take 1 capsule by mouth every 4 hours as needed for Headaches     diazePAM 2 MG tablet  Commonly known as: VALIUM     fluticasone 50 MCG/ACT nasal spray  Commonly known as: FLONASE  2 sprays by Each Nostril route daily     ketorolac 10 MG tablet  Commonly known  as: TORADOL     Lidocaine 3.5 % Ptch     promethazine 50 MG/ML injection  Commonly known as: PHENERGAN                DISCONTINUED MEDICATIONS:  Current Discharge Medication List          I am the Primary Clinician of Record: Domingo Dimes, MD (electronically signed)    (Please note that parts of this dictation were completed with voice recognition software. Quite often unanticipated grammatical, syntax, homophones, and other interpretive errors are inadvertently transcribed by the computer software. Please disregards these errors. Please excuse any errors that have escaped final proofreading.)

## 2022-12-12 NOTE — ED Notes (Signed)
[facility] EMERGENCY DEPT  [address]  [place] [address]  Phone: [phone]             Dec 12, 2022    Patient: Laurie Miller   Date of Birth: July 23, 1961   Date of Visit: 12/12/2022       To Whom It May Concern:    [name] was seen and treated in our emergency department on 12/12/2022. She may return to Work/School on the following date __5/8/2024__________      Sincerely,       Rich Reining, RN         Signature:__________________________________

## 2022-12-29 ENCOUNTER — Inpatient Hospital Stay
Admit: 2022-12-29 | Discharge: 2022-12-29 | Disposition: A | Discharge status: Home / Self Care | Payer: MEDICAID | Attending: Emergency Medicine

## 2022-12-29 DIAGNOSIS — T50905A Adverse effect of unspecified drugs, medicaments and biological substances, initial encounter: Secondary | ICD-10-CM

## 2022-12-29 DIAGNOSIS — T50991A Poisoning by other drugs, medicaments and biological substances, accidental (unintentional), initial encounter: Secondary | ICD-10-CM

## 2022-12-29 LAB — CBC WITH AUTO DIFFERENTIAL
Basophils %: 1 % (ref 0–1)
Basophils Absolute: 0 10*3/uL (ref 0.0–0.1)
Eosinophils %: 0 % (ref 0–7)
Eosinophils Absolute: 0 10*3/uL (ref 0.0–0.4)
Hematocrit: 34.1 % — ABNORMAL LOW (ref 35.0–47.0)
Hemoglobin: 10.5 g/dL — ABNORMAL LOW (ref 11.5–16.0)
Immature Granulocytes %: 1 % — ABNORMAL HIGH (ref 0–0.5)
Immature Granulocytes Absolute: 0 10*3/uL (ref 0.00–0.04)
Lymphocytes %: 31 % (ref 12–49)
Lymphocytes Absolute: 1.5 10*3/uL (ref 0.8–3.5)
MCH: 19.7 PG — ABNORMAL LOW (ref 26.0–34.0)
MCHC: 30.8 g/dL (ref 30.0–36.5)
MCV: 63.9 FL — ABNORMAL LOW (ref 80.0–99.0)
MPV: 9.4 FL (ref 8.9–12.9)
Monocytes %: 7 % (ref 5–13)
Monocytes Absolute: 0.3 10*3/uL (ref 0.0–1.0)
Neutrophils %: 60 % (ref 32–75)
Neutrophils Absolute: 2.9 10*3/uL (ref 1.8–8.0)
Nucleated RBCs: 0 PER 100 WBC
Platelets: 275 10*3/uL (ref 150–400)
RBC: 5.34 M/uL — ABNORMAL HIGH (ref 3.80–5.20)
RDW: 16.5 % — ABNORMAL HIGH (ref 11.5–14.5)
WBC: 4.7 10*3/uL (ref 3.6–11.0)
nRBC: 0 10*3/uL (ref 0.00–0.01)

## 2022-12-29 LAB — COMPREHENSIVE METABOLIC PANEL
ALT: 21 U/L (ref 12–78)
AST: 16 U/L (ref 15–37)
Albumin/Globulin Ratio: 1.1 (ref 1.1–2.2)
Albumin: 3.6 g/dL (ref 3.5–5.0)
Alk Phosphatase: 73 U/L (ref 45–117)
Anion Gap: 8 mmol/L (ref 5–15)
BUN/Creatinine Ratio: 11 — ABNORMAL LOW (ref 12–20)
BUN: 10 mg/dL (ref 6–20)
CO2: 28 mmol/L (ref 21–32)
Calcium: 9.1 mg/dL (ref 8.5–10.1)
Chloride: 107 mmol/L (ref 97–108)
Creatinine: 0.9 mg/dL (ref 0.55–1.02)
Est, Glom Filt Rate: 72 mL/min/{1.73_m2} (ref >60)
Globulin: 3.3 g/dL (ref 2.0–4.0)
Glucose: 98 mg/dL (ref 65–100)
Potassium: 3.8 mmol/L (ref 3.5–5.1)
Sodium: 143 mmol/L (ref 136–145)
Total Bilirubin: 0.6 mg/dL (ref 0.2–1.0)
Total Protein: 6.9 g/dL (ref 6.4–8.2)

## 2022-12-29 MED ORDER — DIPHENHYDRAMINE HCL 50 MG/ML IJ SOLN
50 | INTRAMUSCULAR | Status: AC
Start: 2022-12-29 — End: 2022-12-29
  Administered 2022-12-29: 16:00:00 50 mg via INTRAVENOUS

## 2022-12-29 MED ORDER — ONDANSETRON HCL 4 MG/2ML IJ SOLN
4 | Freq: Once | INTRAMUSCULAR | Status: AC
Start: 2022-12-29 — End: 2022-12-29
  Administered 2022-12-29: 16:00:00 4 mg via INTRAVENOUS

## 2022-12-29 MED FILL — DIPHENHYDRAMINE HCL 50 MG/ML IJ SOLN: 50 MG/ML | INTRAMUSCULAR | Qty: 1

## 2022-12-29 MED FILL — ONDANSETRON HCL 4 MG/2ML IJ SOLN: 4 MG/2ML | INTRAMUSCULAR | Qty: 2

## 2022-12-29 NOTE — ED Notes (Signed)
Up to bathroom- feeling better

## 2022-12-29 NOTE — ED Triage Notes (Signed)
PT reports she saw her neurologist 1 week ago and was prescribed ubrelvy for her migraines. PT report she took te first dose yesterday and immediately became dizzy, jittery and nauseous as well as no relief from her migraine.

## 2022-12-29 NOTE — Discharge Instructions (Signed)
Thank you for choosing our Emergency Department for your care.  It is our privilege to care for you in your time of need.  In the next several days, you may receive a survey via email or mailed to your home about your experience with our team.  We would greatly appreciate you taking a few minutes to complete the survey, as we use this information to learn what we have done well and what we could be doing better. Thank you for trusting [place] with your care!    Below you will find a list of your tests from today's visit.   Labs  Recent Results (from the past 12 hour(s))   CMP    Collection Time: 12/29/22 12:40 PM   Result Value Ref Range    Sodium 143 136 - 145 mmol/L    Potassium 3.8 3.5 - 5.1 mmol/L    Chloride 107 97 - 108 mmol/L    CO2 28 21 - 32 mmol/L    Anion Gap 8 5 - 15 mmol/L    Glucose 98 65 - 100 mg/dL    BUN 10 6 - 20 mg/dL    Creatinine 2.59 5.63 - 1.02 mg/dL    BUN/Creatinine Ratio 11 (L) 12 - 20      Est, Glom Filt Rate 72 >60 ml/min/1.67m2    Calcium 9.1 8.5 - 10.1 mg/dL    Total Bilirubin 0.6 0.2 - 1.0 mg/dL    AST 16 15 - 37 U/L    ALT 21 12 - 78 U/L    Alk Phosphatase 73 45 - 117 U/L    Total Protein 6.9 6.4 - 8.2 g/dL    Albumin 3.6 3.5 - 5.0 g/dL    Globulin 3.3 2.0 - 4.0 g/dL    Albumin/Globulin Ratio 1.1 1.1 - 2.2     CBC with Auto Differential    Collection Time: 12/29/22 12:40 PM   Result Value Ref Range    WBC 4.7 3.6 - 11.0 K/uL    RBC 5.34 (H) 3.80 - 5.20 M/uL    Hemoglobin 10.5 (L) 11.5 - 16.0 g/dL    Hematocrit 87.5 (L) 35.0 - 47.0 %    MCV 63.9 (L) 80.0 - 99.0 FL    MCH 19.7 (L) 26.0 - 34.0 PG    MCHC 30.8 30.0 - 36.5 g/dL    RDW 64.3 (H) 32.9 - 14.5 %    Platelets 275 150 - 400 K/uL    MPV 9.4 8.9 - 12.9 FL    Nucleated RBCs 0.0 0.0 PER 100 WBC    nRBC 0.00 0.00 - 0.01 K/uL    Neutrophils % 60 32 - 75 %    Lymphocytes % 31 12 - 49 %    Monocytes % 7 5 - 13 %    Eosinophils % 0 0 - 7 %    Basophils % 1 0 - 1 %    Immature Granulocytes % 1 (H) 0 - 0.5 %    Neutrophils Absolute 2.9 1.8 -  8.0 K/UL    Lymphocytes Absolute 1.5 0.8 - 3.5 K/UL    Monocytes Absolute 0.3 0.0 - 1.0 K/UL    Eosinophils Absolute 0.0 0.0 - 0.4 K/UL    Basophils Absolute 0.0 0.0 - 0.1 K/UL    Immature Granulocytes Absolute 0.0 0.00 - 0.04 K/UL    Differential Type Smear Scanned      RBC Comment Anisocytosis  1+        RBC Comment Microcytosis  1+           Radiologic Studies  No orders to display     ------------------------------------------------------------------------------------------------------------  The evaluation and treatment you received in the Emergency Department were for an urgent problem. It is important that you follow-up with a doctor, nurse practitioner, or physician assistant to:  (1) confirm your diagnosis,  (2) re-evaluation of changes in your illness and treatment, and (3) for ongoing care. Please take your discharge instructions with you when you go to your follow-up appointment.     If you have any problem arranging a follow-up appointment, contact us!  If your symptoms become worse or you do not improve as expected, please return to [place]. We are available 24 hours a day.     If a prescription has been provided, please fill it as soon as possible to prevent a delay in treatment. If you have any questions or reservations about taking the medication due to side effects or interactions with other medications, please call your primary care provider or contact us directly.  Again, THANK YOU for choosing [place] to care for YOU!

## 2022-12-29 NOTE — ED Notes (Signed)
Called  lab- 15 minutes for labs to result

## 2022-12-29 NOTE — ED Provider Notes (Signed)
[facility] EMERGENCY DEPT  EMERGENCY DEPARTMENT HISTORY AND PHYSICAL EXAM      Date: 12/29/2022  Patient Name: Laurie Miller  MRN: [id number]  Birthdate: 10-May-1961  Date of evaluation: 12/29/2022  Provider: Traci Sermon, MD   Note Started: 11:56 AM EDT 12/29/22    HISTORY OF PRESENT ILLNESS     Chief Complaint   Patient presents with    Medication Reaction       History Provided By: Patient    HPI: Laurie Miller is a 62 y.o. female With history of hypertension, migraines presenting with feeling of nausea, jitteriness, dizziness.  She took her first dose of Ubrelvy yesterday and the symptoms started soon afterward.  No other medications.  No chest pain or shortness of breath, rashes, lightheadedness    PAST MEDICAL HISTORY   Past Medical History:  Past Medical History:   Diagnosis Date    Bursitis     CKD (chronic kidney disease)     Fatty tumor     Hypertension        Past Surgical History:  Past Surgical History:   Procedure Laterality Date    ORTHOPEDIC SURGERY         Family History:  History reviewed. No pertinent family history.    Social History:  Social History     Tobacco Use    Smoking status: Never    Smokeless tobacco: Never   Substance Use Topics    Alcohol use: Not Currently       Allergies:  Allergies   Allergen Reactions    Aspirin Nausea Only    Celecoxib Nausea Only    Hydrocodone-Acetaminophen Nausea Only    Ibuprofen Nausea Only    Ketorolac Nausea Only    Meperidine Itching    Naproxen Nausea Only    Nsaids     Oxycodone-Acetaminophen Itching    Tramadol Nausea Only    Clarithromycin Rash    Penicillins Angioedema and Rash     Hemorrhage    hemorrhage    Sulfa Antibiotics Hives and Rash     Other Reaction(s): Itchy, rash       PCP: No primary care provider on file.    Current Meds:   No current facility-administered medications for this encounter.     Current Outpatient Medications   Medication Sig Dispense Refill    ondansetron (ZOFRAN) 4 MG tablet Take 1 tablet by mouth 3 times daily as needed for  Nausea or Vomiting 15 tablet 0    ibuprofen (ADVIL;MOTRIN) 600 MG tablet Take 1 tablet by mouth 4 times daily as needed for Pain 20 tablet 0    butalbital-APAP-caffeine (FIORICET) 50-300-40 MG CAPS per capsule Take 1 capsule by mouth every 4 hours as needed for Headaches 84 capsule 0    acetaminophen (TYLENOL) 500 MG tablet Take 1 tablet by mouth every 6 hours as needed for Pain 30 tablet 1    fluticasone (FLONASE) 50 MCG/ACT nasal spray 2 sprays by Each Nostril route daily 16 g 0    diazePAM (VALIUM) 2 MG tablet Take 5 mg by mouth every 8 hours as needed.      ketorolac (TORADOL) 10 MG tablet Take 10 mg by mouth every 6 hours as needed      Lidocaine 3.5 % PTCH Apply 1 patch topically      promethazine (PHENERGAN) 50 MG/ML injection by NOT APPLICABLE route         Social Determinants of Health:   Social Determinants of Health  Tobacco Use: Low Risk  (12/29/2022)    Patient History     Smoking Tobacco Use: Never     Smokeless Tobacco Use: Never     Passive Exposure: Not on file   Alcohol Use: Not on file   Financial Resource Strain: Not on file   Food Insecurity: Not on file   Transportation Needs: Not on file   Physical Activity: Not on file   Stress: Not on file   Social Connections: Not on file   Intimate Partner Violence: Not on file   Depression: Not on file   Housing Stability: Not on file   Interpersonal Safety: Not on file   Utilities: Not on file       PHYSICAL EXAM   Physical Exam  Vitals and nursing note reviewed.   Constitutional:       Appearance: Normal appearance.   HENT:      Head: Normocephalic and atraumatic.      Mouth/Throat:      Mouth: Mucous membranes are moist.   Eyes:      Extraocular Movements: Extraocular movements intact.      Conjunctiva/sclera: Conjunctivae normal.   Cardiovascular:      Rate and Rhythm: Normal rate and regular rhythm.      Heart sounds: No murmur heard.  Pulmonary:      Effort: Pulmonary effort is normal. No respiratory distress.      Breath sounds: Normal breath  sounds. No stridor. No wheezing, rhonchi or rales.   Abdominal:      General: Abdomen is flat. There is no distension.      Tenderness: There is no abdominal tenderness.   Musculoskeletal:         General: Normal range of motion.   Skin:     General: Skin is warm and dry.      Capillary Refill: Capillary refill takes less than 2 seconds.   Neurological:      General: No focal deficit present.      Mental Status: She is alert and oriented to person, place, and time. Mental status is at baseline.            SCREENINGS                  LAB, EKG AND DIAGNOSTIC RESULTS   Labs:  Recent Results (from the past 12 hour(s))   CMP    Collection Time: 12/29/22 12:40 PM   Result Value Ref Range    Sodium 143 136 - 145 mmol/L    Potassium 3.8 3.5 - 5.1 mmol/L    Chloride 107 97 - 108 mmol/L    CO2 28 21 - 32 mmol/L    Anion Gap 8 5 - 15 mmol/L    Glucose 98 65 - 100 mg/dL    BUN 10 6 - 20 mg/dL    Creatinine 1.61 0.96 - 1.02 mg/dL    BUN/Creatinine Ratio 11 (L) 12 - 20      Est, Glom Filt Rate 72 >60 ml/min/1.72m2    Calcium 9.1 8.5 - 10.1 mg/dL    Total Bilirubin 0.6 0.2 - 1.0 mg/dL    AST 16 15 - 37 U/L    ALT 21 12 - 78 U/L    Alk Phosphatase 73 45 - 117 U/L    Total Protein 6.9 6.4 - 8.2 g/dL    Albumin 3.6 3.5 - 5.0 g/dL    Globulin 3.3 2.0 - 4.0 g/dL    Albumin/Globulin Ratio 1.1  1.1 - 2.2     CBC with Auto Differential    Collection Time: 12/29/22 12:40 PM   Result Value Ref Range    WBC 4.7 3.6 - 11.0 K/uL    RBC 5.34 (H) 3.80 - 5.20 M/uL    Hemoglobin 10.5 (L) 11.5 - 16.0 g/dL    Hematocrit 16.1 (L) 35.0 - 47.0 %    MCV 63.9 (L) 80.0 - 99.0 FL    MCH 19.7 (L) 26.0 - 34.0 PG    MCHC 30.8 30.0 - 36.5 g/dL    RDW 09.6 (H) 04.5 - 14.5 %    Platelets 275 150 - 400 K/uL    MPV 9.4 8.9 - 12.9 FL    Nucleated RBCs 0.0 0.0 PER 100 WBC    nRBC 0.00 0.00 - 0.01 K/uL    Neutrophils % 60 32 - 75 %    Lymphocytes % 31 12 - 49 %    Monocytes % 7 5 - 13 %    Eosinophils % 0 0 - 7 %    Basophils % 1 0 - 1 %    Immature Granulocytes % 1  (H) 0 - 0.5 %    Neutrophils Absolute 2.9 1.8 - 8.0 K/UL    Lymphocytes Absolute 1.5 0.8 - 3.5 K/UL    Monocytes Absolute 0.3 0.0 - 1.0 K/UL    Eosinophils Absolute 0.0 0.0 - 0.4 K/UL    Basophils Absolute 0.0 0.0 - 0.1 K/UL    Immature Granulocytes Absolute 0.0 0.00 - 0.04 K/UL    Differential Type Smear Scanned      RBC Comment Anisocytosis  1+        RBC Comment Microcytosis  1+           EKG: Not Applicable    Radiologic Studies:  Non-plain film images such as CT, Ultrasound and MRI are read by the radiologist. Plain radiographic images are visualized and preliminarily interpreted by the ED Physician with the following findings: Not Applicable.    Interpretation per the Radiologist below, if available at the time of this note:  No orders to display        ED COURSE and DIFFERENTIAL DIAGNOSIS/MDM   1:45 PM Differential and Considerations: 62 year old female presenting with complaints of nausea, dizziness, jitteriness since taking her first dose of Ubrelvy yesterday.  No anaphylactic signs or symptoms.  Patient takes amlodipine but no other medicationsThis medication is a calcitonin gene related peptide receptor antagonist.  Primary side effects listed on up-to-date include nausea and drowsiness.  Will treat symptomatically with Zofran, Benadryl.  Will obtain screening labs and reevaluate.    Records Reviewed (source and summary of external notes): Prior medical records and Nursing notes    Vitals:    Vitals:    12/29/22 1247 12/29/22 1317 12/29/22 1321 12/29/22 1330   BP:   (!) 145/78    Pulse:       Resp:    14   Temp:       TempSrc:       SpO2: 97% 97% 98%    Weight:       Height:            ED COURSE  ED Course as of 12/29/22 1345   Wed Dec 29, 2022   1344 Patient is feeling better. Recommended disucssing with her pcp prior to resuming her migraine medication [KK]      ED Course User Index  [KK] Traci Sermon, MD  SEPSIS Reassessment: Sepsis reassessment not applicable    Clinical Management  Tools:  Not Applicable    Patient was given the following medications:  Medications   diphenhydrAMINE (BENADRYL) injection 50 mg (50 mg IntraVENous Given 12/29/22 1217)   ondansetron (ZOFRAN) injection 4 mg (4 mg IntraVENous Given 12/29/22 1216)       CONSULTS: See ED Course/MDM for further details.  None     Social Determinants affecting Diagnosis/Treatment: None    Smoking Cessation: Not Applicable    PROCEDURES   Unless otherwise noted above, none.  Procedures      CRITICAL CARE TIME   Patient does not meet Critical Care Time, 0 minutes    ED IMPRESSION     1. Adverse reaction to drug, initial encounter          DISPOSITION/PLAN   DISPOSITION Decision To Discharge 12/29/2022 01:17:12 PM    Discharge Note: The patient is stable for discharge home. The signs, symptoms, diagnosis, and discharge instructions have been discussed, understanding conveyed, and agreed upon. The patient is to follow up as recommended or return to ER should their symptoms worsen.      PATIENT REFERRED TO:  No follow-up provider specified.      DISCHARGE MEDICATIONS:     Medication List        ASK your doctor about these medications      acetaminophen 500 MG tablet  Commonly known as: TYLENOL  Take 1 tablet by mouth every 6 hours as needed for Pain     butalbital-APAP-caffeine 50-300-40 MG Caps per capsule  Commonly known as: Fioricet  Take 1 capsule by mouth every 4 hours as needed for Headaches     diazePAM 2 MG tablet  Commonly known as: VALIUM     fluticasone 50 MCG/ACT nasal spray  Commonly known as: FLONASE  2 sprays by Each Nostril route daily     ibuprofen 600 MG tablet  Commonly known as: ADVIL;MOTRIN  Take 1 tablet by mouth 4 times daily as needed for Pain     ketorolac 10 MG tablet  Commonly known as: TORADOL     Lidocaine 3.5 % Ptch     ondansetron 4 MG tablet  Commonly known as: ZOFRAN  Take 1 tablet by mouth 3 times daily as needed for Nausea or Vomiting     promethazine 50 MG/ML injection  Commonly known as: PHENERGAN                 DISCONTINUED MEDICATIONS:  Discharge Medication List as of 12/29/2022  1:26 PM          I am the Primary Clinician of Record: Traci Sermon, MD (electronically signed)    (Please note that parts of this dictation were completed with voice recognition software. Quite often unanticipated grammatical, syntax, homophones, and other interpretive errors are inadvertently transcribed by the computer software. Please disregards these errors. Please excuse any errors that have escaped final proofreading.)     Traci Sermon, MD  12/29/22 1345

## 2023-01-17 NOTE — ED Triage Notes (Signed)
Formatting of this note might be different from the original.  From home with the complaints of dizziness/HA, back pain and lower extremity pain and swelling, states legs began to hurt today along with the dizziness while she was getting out of the shower, back has been hurting off and on for the past three days. Denies any injuries. Minimal swelling noted to BLE, Hx of vertigo/migraines  Electronically signed by Blima Rich, RN at 01/17/2023  8:00 PM EDT

## 2023-01-17 NOTE — ED Notes (Signed)
Formatting of this note might be different from the original.  Upon returning to ED wait room, patient nor her husband could be located in ED or in front of ED lobby, didn't inform anyone of departure, considered lwot at his time    Electronically signed by Blima Rich, RN at 01/17/2023 10:23 PM EDT

## 2023-01-18 ENCOUNTER — Inpatient Hospital Stay
Admit: 2023-01-18 | Discharge: 2023-01-18 | Disposition: A | Discharge status: Home / Self Care | Payer: MEDICAID | Attending: Emergency Medicine

## 2023-01-18 DIAGNOSIS — R600 Localized edema: Secondary | ICD-10-CM

## 2023-01-18 LAB — CBC WITH AUTO DIFFERENTIAL
Basophils %: 1 % (ref 0–1)
Basophils Absolute: 0 10*3/uL (ref 0.0–0.1)
Eosinophils %: 1 % (ref 0–7)
Eosinophils Absolute: 0 10*3/uL (ref 0.0–0.4)
Hematocrit: 32.7 % — ABNORMAL LOW (ref 35.0–47.0)
Hemoglobin: 10.1 g/dL — ABNORMAL LOW (ref 11.5–16.0)
Immature Granulocytes %: 1 % — ABNORMAL HIGH (ref 0–0.5)
Immature Granulocytes Absolute: 0 10*3/uL (ref 0.00–0.04)
Lymphocytes %: 32 % (ref 12–49)
Lymphocytes Absolute: 1.3 10*3/uL (ref 0.8–3.5)
MCH: 19.9 PG — ABNORMAL LOW (ref 26.0–34.0)
MCHC: 30.9 g/dL (ref 30.0–36.5)
MCV: 64.4 FL — ABNORMAL LOW (ref 80.0–99.0)
MPV: 10 FL (ref 8.9–12.9)
Monocytes %: 9 % (ref 5–13)
Monocytes Absolute: 0.4 10*3/uL (ref 0.0–1.0)
Neutrophils %: 56 % (ref 32–75)
Neutrophils Absolute: 2.5 10*3/uL (ref 1.8–8.0)
Nucleated RBCs: 0 PER 100 WBC
Platelets: 238 10*3/uL (ref 150–400)
RBC: 5.08 M/uL (ref 3.80–5.20)
RDW: 16.5 % — ABNORMAL HIGH (ref 11.5–14.5)
WBC: 4.2 10*3/uL (ref 3.6–11.0)
nRBC: 0 10*3/uL (ref 0.00–0.01)

## 2023-01-18 LAB — BRAIN NATRIURETIC PEPTIDE: NT Pro-BNP: 142 pg/mL — ABNORMAL HIGH (ref <125)

## 2023-01-18 LAB — URINALYSIS WITH REFLEX TO CULTURE
BACTERIA, URINE: NEGATIVE /hpf
Bilirubin, Urine: NEGATIVE
Glucose, Ur: NEGATIVE mg/dL
Ketones, Urine: NEGATIVE mg/dL
Nitrite, Urine: NEGATIVE
Protein, UA: NEGATIVE mg/dL
Specific Gravity, UA: 1.015 (ref 1.003–1.030)
Urobilinogen, Urine: 0.2 EU/dL (ref 0.2–1.0)
pH, Urine: 6 (ref 5.0–8.0)

## 2023-01-18 LAB — COMPREHENSIVE METABOLIC PANEL
ALT: 21 U/L (ref 12–78)
AST: 20 U/L (ref 15–37)
Albumin/Globulin Ratio: 1.2 (ref 1.1–2.2)
Albumin: 3.7 g/dL (ref 3.5–5.0)
Alk Phosphatase: 69 U/L (ref 45–117)
Anion Gap: 8 mmol/L (ref 5–15)
BUN/Creatinine Ratio: 18 (ref 12–20)
BUN: 13 mg/dL (ref 6–20)
CO2: 26 mmol/L (ref 21–32)
Calcium: 8.9 mg/dL (ref 8.5–10.1)
Chloride: 108 mmol/L (ref 97–108)
Creatinine: 0.74 mg/dL (ref 0.55–1.02)
Est, Glom Filt Rate: >90 mL/min/{1.73_m2} (ref >60)
Globulin: 3.2 g/dL (ref 2.0–4.0)
Glucose: 100 mg/dL (ref 65–100)
Potassium: 3.9 mmol/L (ref 3.5–5.1)
Sodium: 142 mmol/L (ref 136–145)
Total Bilirubin: 0.7 mg/dL (ref 0.2–1.0)
Total Protein: 6.9 g/dL (ref 6.4–8.2)

## 2023-01-18 NOTE — ED Provider Notes (Signed)
[facility] EMER[facility] DE[facility]  EMERGENCY DEPARTMENT HISTORY AND PHYSICAL EXAM      Date: 01/18/2023  Patient Name: Laurie Miller  MRN: [id number]  Birthdate: 09-06-1960  Date of evaluation: 01/18/2023  Provider: Marquette Old, MD   Note Started: 6:57 PM EDT 01/18/23    HISTORY OF PRESENT ILLNESS     Chief Complaint   Patient presents with    Foot Swelling       History Provided By: Patient    HPI: Marvetta Vohs is a 62 y.o. female with CKD presents to ED for bilat foot swelling and ankle over past several days. She states it gets better at night but seems to worsen over day. She denies heart failure. She denies salting her food excessively. She denies calf pain or swelling. Pt also asking for prescriptions for her migraine medicine (Stadol) which she has been out of for years.     PAST MEDICAL HISTORY   Past Medical History:  Past Medical History:   Diagnosis Date    Bursitis     CKD (chronic kidney disease)     Fatty tumor     Hypertension        Past Surgical History:  Past Surgical History:   Procedure Laterality Date    ORTHOPEDIC SURGERY         Family History:  No family history on file.    Social History:  Social History     Tobacco Use    Smoking status: Never    Smokeless tobacco: Never   Substance Use Topics    Alcohol use: Not Currently       Allergies:  Allergies   Allergen Reactions    Aspirin Nausea Only    Celecoxib Nausea Only    Hydrocodone-Acetaminophen Nausea Only    Ibuprofen Nausea Only    Ketorolac Nausea Only    Meperidine Itching    Naproxen Nausea Only    Nsaids     Oxycodone-Acetaminophen Itching    Tramadol Nausea Only    Clarithromycin Rash    Penicillins Angioedema and Rash     Hemorrhage    hemorrhage    Sulfa Antibiotics Hives and Rash     Other Reaction(s): Itchy, rash       PCP: No primary care provider on file.    Current Meds:   No current facility-administered medications for this encounter.     Current Outpatient Medications   Medication Sig Dispense Refill    ondansetron (ZOFRAN) 4 MG tablet Take 1  tablet by mouth 3 times daily as needed for Nausea or Vomiting 15 tablet 0    ibuprofen (ADVIL;MOTRIN) 600 MG tablet Take 1 tablet by mouth 4 times daily as needed for Pain 20 tablet 0    butalbital-APAP-caffeine (FIORICET) 50-300-40 MG CAPS per capsule Take 1 capsule by mouth every 4 hours as needed for Headaches 84 capsule 0    acetaminophen (TYLENOL) 500 MG tablet Take 1 tablet by mouth every 6 hours as needed for Pain 30 tablet 1    fluticasone (FLONASE) 50 MCG/ACT nasal spray 2 sprays by Each Nostril route daily 16 g 0    diazePAM (VALIUM) 2 MG tablet Take 5 mg by mouth every 8 hours as needed.      ketorolac (TORADOL) 10 MG tablet Take 10 mg by mouth every 6 hours as needed      Lidocaine 3.5 % PTCH Apply 1 patch topically      promethazine (PHENERGAN) 50 MG/ML injection by NOT APPLICABLE  route         Social Determinants of Health:   Social Determinants of Health     Tobacco Use: Low Risk  (12/29/2022)    Patient History     Smoking Tobacco Use: Never     Smokeless Tobacco Use: Never     Passive Exposure: Not on file   Alcohol Use: Not on file   Financial Resource Strain: Not on file   Food Insecurity: Not on file   Transportation Needs: Not on file   Physical Activity: Not on file   Stress: Not on file   Social Connections: Not on file   Intimate Partner Violence: Not on file   Depression: Not on file   Housing Stability: Not on file   Interpersonal Safety: Not on file   Utilities: Not on file       PHYSICAL EXAM   Physical Exam  Vitals and nursing note reviewed.   Constitutional:       General: She is not in acute distress.     Appearance: She is well-developed. She is not ill-appearing or toxic-appearing.   Cardiovascular:      Rate and Rhythm: Normal rate.   Pulmonary:      Effort: Pulmonary effort is normal.   Abdominal:      Palpations: Abdomen is soft.   Musculoskeletal:         General: Normal range of motion.      Comments: Mild foot and ankle edema   Skin:     General: Skin is warm.   Neurological:       Mental Status: She is alert.         SCREENINGS                  LAB, EKG AND DIAGNOSTIC RESULTS   Labs:  Recent Results (from the past 12 hour(s))   CBC with Auto Differential    Collection Time: 01/18/23  8:15 AM   Result Value Ref Range    WBC 4.2 3.6 - 11.0 K/uL    RBC 5.08 3.80 - 5.20 M/uL    Hemoglobin 10.1 (L) 11.5 - 16.0 g/dL    Hematocrit 16.1 (L) 35.0 - 47.0 %    MCV 64.4 (L) 80.0 - 99.0 FL    MCH 19.9 (L) 26.0 - 34.0 PG    MCHC 30.9 30.0 - 36.5 g/dL    RDW 09.6 (H) 04.5 - 14.5 %    Platelets 238 150 - 400 K/uL    MPV 10.0 8.9 - 12.9 FL    Nucleated RBCs 0.0 0.0 PER 100 WBC    nRBC 0.00 0.00 - 0.01 K/uL    Neutrophils % 56 32 - 75 %    Lymphocytes % 32 12 - 49 %    Monocytes % 9 5 - 13 %    Eosinophils % 1 0 - 7 %    Basophils % 1 0 - 1 %    Immature Granulocytes % 1 (H) 0 - 0.5 %    Neutrophils Absolute 2.5 1.8 - 8.0 K/UL    Lymphocytes Absolute 1.3 0.8 - 3.5 K/UL    Monocytes Absolute 0.4 0.0 - 1.0 K/UL    Eosinophils Absolute 0.0 0.0 - 0.4 K/UL    Basophils Absolute 0.0 0.0 - 0.1 K/UL    Immature Granulocytes Absolute 0.0 0.00 - 0.04 K/UL    Differential Type Smear Scanned      RBC Comment Microcytosis  2+  Comprehensive Metabolic Panel    Collection Time: 01/18/23  8:15 AM   Result Value Ref Range    Sodium 142 136 - 145 mmol/L    Potassium 3.9 3.5 - 5.1 mmol/L    Chloride 108 97 - 108 mmol/L    CO2 26 21 - 32 mmol/L    Anion Gap 8 5 - 15 mmol/L    Glucose 100 65 - 100 mg/dL    BUN 13 6 - 20 mg/dL    Creatinine 6.04 5.40 - 1.02 mg/dL    BUN/Creatinine Ratio 18 12 - 20      Est, Glom Filt Rate >90 >60 ml/min/1.20m2    Calcium 8.9 8.5 - 10.1 mg/dL    Total Bilirubin 0.7 0.2 - 1.0 mg/dL    AST 20 15 - 37 U/L    ALT 21 12 - 78 U/L    Alk Phosphatase 69 45 - 117 U/L    Total Protein 6.9 6.4 - 8.2 g/dL    Albumin 3.7 3.5 - 5.0 g/dL    Globulin 3.2 2.0 - 4.0 g/dL    Albumin/Globulin Ratio 1.2 1.1 - 2.2     Urinalysis with Reflex to Culture    Collection Time: 01/18/23  8:15 AM    Specimen: Urine    Result Value Ref Range    Color, UA Yellow/Straw      Appearance Clear Clear      Specific Gravity, UA 1.015 1.003 - 1.030      pH, Urine 6.0 5.0 - 8.0      Protein, UA Negative Negative mg/dL    Glucose, Ur Negative Negative mg/dL    Ketones, Urine Negative Negative mg/dL    Bilirubin, Urine Negative Negative      Blood, Urine Trace (A) Negative      Urobilinogen, Urine 0.2 0.2 - 1.0 EU/dL    Nitrite, Urine Negative Negative      Leukocyte Esterase, Urine Small (A) Negative      WBC, UA 5-10 0 - 5 /hpf    RBC, UA 5-10 0 - 3 /hpf    BACTERIA, URINE Negative Negative /hpf    Urine Culture if Indicated Culture not indicated by UA result Culture not indicated by UA result     Brain Natriuretic Peptide    Collection Time: 01/18/23  8:15 AM   Result Value Ref Range    NT Pro-BNP 142 (H) <125 pg/mL       EKG: Not Applicable    Radiologic Studies:  Non-plain film images such as CT, Ultrasound and MRI are read by the radiologist. Plain radiographic images are visualized and preliminarily interpreted by the ED Physician with the following findings: Not Applicable.    Interpretation per the Radiologist below, if available at the time of this note:  No orders to display        ED COURSE and DIFFERENTIAL DIAGNOSIS/MDM   6:57 PM Differential and Considerations: Mild edema- will evalaute for worsening CKD, BNP for possible underlying CHF, diet related/salt intake, uncontrolled hypertension. History and exam not consistent although considered DVT, no calf ttp or size discrepancy.    Records Reviewed (source and summary of external notes): Prior medical records and Nursing notes    Vitals:    Vitals:    01/18/23 0701 01/18/23 0900 01/18/23 1015   BP: (!) 152/91 (!) 161/82    Pulse: 64 66 65   Resp: 18 16 15    Temp: 97.9 F (36.6 C)     TempSrc:  Oral     SpO2: 98% 98% 95%   Weight: 79.8 kg (176 lb)          ED COURSE       SEPSIS Reassessment: Sepsis reassessment not applicable    Clinical Management Tools:  Not  Applicable    Patient was given the following medications:  Medications - No data to display    CONSULTS: See ED Course/MDM for further details.  None     Social Determinants affecting Diagnosis/Treatment: None    Smoking Cessation: Not Applicable    PROCEDURES   Unless otherwise noted above, none.  Procedures      CRITICAL CARE TIME   Patient does not meet Critical Care Time, 0 minutes    ED IMPRESSION     1. Lower extremity edema          DISPOSITION/PLAN   DISPOSITION Decision To Discharge 01/18/2023 10:11:21 AM    Discharge Note: The patient is stable for discharge home. The signs, symptoms, diagnosis, and discharge instructions have been discussed, understanding conveyed, and agreed upon. The patient is to follow up as recommended or return to ER should their symptoms worsen.      PATIENT REFERRED TO:    Follow-up with your PCP.        [facility] EMERGENCY DEPT  [address]  [place] [address]  [phone]            DISCHARGE MEDICATIONS:     Medication List        ASK your doctor about these medications      acetaminophen 500 MG tablet  Commonly known as: TYLENOL  Take 1 tablet by mouth every 6 hours as needed for Pain     butalbital-APAP-caffeine 50-300-40 MG Caps per capsule  Commonly known as: Fioricet  Take 1 capsule by mouth every 4 hours as needed for Headaches     diazePAM 2 MG tablet  Commonly known as: VALIUM     fluticasone 50 MCG/ACT nasal spray  Commonly known as: FLONASE  2 sprays by Each Nostril route daily     ibuprofen 600 MG tablet  Commonly known as: ADVIL;MOTRIN  Take 1 tablet by mouth 4 times daily as needed for Pain     ketorolac 10 MG tablet  Commonly known as: TORADOL     Lidocaine 3.5 % Ptch     ondansetron 4 MG tablet  Commonly known as: ZOFRAN  Take 1 tablet by mouth 3 times daily as needed for Nausea or Vomiting     promethazine 50 MG/ML injection  Commonly known as: PHENERGAN                DISCONTINUED MEDICATIONS:  Discharge Medication List as of 01/18/2023 10:14 AM          I  am the Primary Clinician of Record: Marquette Old, MD (electronically signed)    (Please note that parts of this dictation were completed with voice recognition software. Quite often unanticipated grammatical, syntax, homophones, and other interpretive errors are inadvertently transcribed by the computer software. Please disregards these errors. Please excuse any errors that have escaped final proofreading.)     Marquette Old, MD  01/18/23 [phone]

## 2023-01-18 NOTE — ED Notes (Signed)
MD states pt requesting work note- states she will write for it

## 2023-01-18 NOTE — ED Notes (Signed)
Pt resting on bed

## 2023-01-18 NOTE — ED Triage Notes (Signed)
Pt arrived to ED via POV with CC of bilateral foot swelling and pain. Also dizziness and chronic back pain.     Pt states yesterday her feet started swelling from the mid calf down. Some petechiae and minor swelling noted.     Pt states the spinning room may be a migraine coming on because her doctor that was giving Rx for migraines no longer practices.

## 2023-01-18 NOTE — Discharge Instructions (Addendum)
Thank you for choosing our Emergency Department for your care.  It is our privilege to care for you in your time of need.  In the next several days, you may receive a survey via email or mailed to your home about your experience with our team.  We would greatly appreciate you taking a few minutes to complete the survey, as we use this information to learn what we have done well and what we could be doing better. Thank you for trusting [place] with your care!    Below you will find a list of your tests from today's visit.   Labs  Recent Results (from the past 12 hour(s))   CBC with Auto Differential    Collection Time: 01/18/23  8:15 AM   Result Value Ref Range    WBC 4.2 3.6 - 11.0 K/uL    RBC 5.08 3.80 - 5.20 M/uL    Hemoglobin 10.1 (L) 11.5 - 16.0 g/dL    Hematocrit 16.1 (L) 35.0 - 47.0 %    MCV 64.4 (L) 80.0 - 99.0 FL    MCH 19.9 (L) 26.0 - 34.0 PG    MCHC 30.9 30.0 - 36.5 g/dL    RDW 09.6 (H) 04.5 - 14.5 %    Platelets 238 150 - 400 K/uL    MPV 10.0 8.9 - 12.9 FL    Nucleated RBCs 0.0 0.0 PER 100 WBC    nRBC 0.00 0.00 - 0.01 K/uL    Neutrophils % 56 32 - 75 %    Lymphocytes % 32 12 - 49 %    Monocytes % 9 5 - 13 %    Eosinophils % 1 0 - 7 %    Basophils % 1 0 - 1 %    Immature Granulocytes % 1 (H) 0 - 0.5 %    Neutrophils Absolute 2.5 1.8 - 8.0 K/UL    Lymphocytes Absolute 1.3 0.8 - 3.5 K/UL    Monocytes Absolute 0.4 0.0 - 1.0 K/UL    Eosinophils Absolute 0.0 0.0 - 0.4 K/UL    Basophils Absolute 0.0 0.0 - 0.1 K/UL    Immature Granulocytes Absolute 0.0 0.00 - 0.04 K/UL    Differential Type Smear Scanned      RBC Comment Microcytosis  2+       Comprehensive Metabolic Panel    Collection Time: 01/18/23  8:15 AM   Result Value Ref Range    Sodium 142 136 - 145 mmol/L    Potassium 3.9 3.5 - 5.1 mmol/L    Chloride 108 97 - 108 mmol/L    CO2 26 21 - 32 mmol/L    Anion Gap 8 5 - 15 mmol/L    Glucose 100 65 - 100 mg/dL    BUN 13 6 - 20 mg/dL    Creatinine 4.09 8.11 - 1.02 mg/dL    BUN/Creatinine Ratio 18 12 - 20      Est,  Glom Filt Rate >90 >60 ml/min/1.53m2    Calcium 8.9 8.5 - 10.1 mg/dL    Total Bilirubin 0.7 0.2 - 1.0 mg/dL    AST 20 15 - 37 U/L    ALT 21 12 - 78 U/L    Alk Phosphatase 69 45 - 117 U/L    Total Protein 6.9 6.4 - 8.2 g/dL    Albumin 3.7 3.5 - 5.0 g/dL    Globulin 3.2 2.0 - 4.0 g/dL    Albumin/Globulin Ratio 1.2 1.1 - 2.2     Urinalysis with Reflex  to Culture    Collection Time: 01/18/23  8:15 AM    Specimen: Urine   Result Value Ref Range    Color, UA Yellow/Straw      Appearance Clear Clear      Specific Gravity, UA 1.015 1.003 - 1.030      pH, Urine 6.0 5.0 - 8.0      Protein, UA Negative Negative mg/dL    Glucose, Ur Negative Negative mg/dL    Ketones, Urine Negative Negative mg/dL    Bilirubin, Urine Negative Negative      Blood, Urine Trace (A) Negative      Urobilinogen, Urine 0.2 0.2 - 1.0 EU/dL    Nitrite, Urine Negative Negative      Leukocyte Esterase, Urine Small (A) Negative      WBC, UA 5-10 0 - 5 /hpf    RBC, UA 5-10 0 - 3 /hpf    BACTERIA, URINE Negative Negative /hpf    Urine Culture if Indicated Culture not indicated by UA result Culture not indicated by UA result     Brain Natriuretic Peptide    Collection Time: 01/18/23  8:15 AM   Result Value Ref Range    NT Pro-BNP 142 (H) <125 pg/mL       Radiologic Studies  No orders to display     ------------------------------------------------------------------------------------------------------------  The evaluation and treatment you received in the Emergency Department were for an urgent problem. It is important that you follow-up with a doctor, nurse practitioner, or physician assistant to:  (1) confirm your diagnosis,  (2) re-evaluation of changes in your illness and treatment, and (3) for ongoing care. Please take your discharge instructions with you when you go to your follow-up appointment.     If you have any problem arranging a follow-up appointment, contact us!  If your symptoms become worse or you do not improve as expected, please return to  [place]. We are available 24 hours a day.     If a prescription has been provided, please fill it as soon as possible to prevent a delay in treatment. If you have any questions or reservations about taking the medication due to side effects or interactions with other medications, please call your primary care provider or contact us directly.  Again, THANK YOU for choosing [place] to care for YOU!

## 2023-04-11 ENCOUNTER — Inpatient Hospital Stay
Admit: 2023-04-11 | Discharge: 2023-04-11 | Disposition: A | Discharge status: Home / Self Care | Payer: MEDICAID | Attending: Emergency Medicine

## 2023-04-11 DIAGNOSIS — N3000 Acute cystitis without hematuria: Secondary | ICD-10-CM

## 2023-04-11 LAB — URINALYSIS WITH REFLEX TO CULTURE
Bilirubin, Urine: NEGATIVE
Glucose, Ur: NEGATIVE mg/dL
Ketones, Urine: NEGATIVE mg/dL
Nitrite, Urine: POSITIVE — AB
Protein, UA: NEGATIVE mg/dL
Specific Gravity, UA: 1.025 (ref 1.003–1.030)
Urobilinogen, Urine: 0.2 EU/dL (ref 0.2–1.0)
pH, Urine: 5.5 (ref 5.0–8.0)

## 2023-04-11 MED ORDER — LEVOFLOXACIN 500 MG PO TABS
500 | ORAL | Status: AC
Start: 2023-04-11 — End: 2023-04-11

## 2023-04-11 MED ORDER — LEVOFLOXACIN 500 MG PO TABS
500 | ORAL | Status: DC
Start: 2023-04-11 — End: 2023-04-11

## 2023-04-11 MED ORDER — LORAZEPAM 2 MG/ML IJ SOLN
2 | Freq: Once | INTRAMUSCULAR | Status: DC
Start: 2023-04-11 — End: 2023-04-11

## 2023-04-11 MED ORDER — LEVOFLOXACIN 500 MG PO TABS
500 | ORAL_TABLET | Freq: Every day | ORAL | 0 refills | Status: AC
Start: 2023-04-11 — End: 2023-04-18

## 2023-04-11 MED ORDER — LEVETIRACETAM 500 MG/5ML IV SOLN
500 | INTRAVENOUS | Status: DC
Start: 2023-04-11 — End: 2023-04-11

## 2023-04-11 MED ADMIN — levoFLOXacin (LEVAQUIN) 500 MG tablet: 500 | @ 19:00:00 | NDC 00904635261

## 2023-04-11 MED FILL — LEVOFLOXACIN 500 MG PO TABS: 500 MG | ORAL | Qty: 1

## 2023-04-11 NOTE — ED Triage Notes (Signed)
 Patient reports she thinks she has a UTI due to foul smelling urine for the last few days patient reports history ofCKD and wants to make sure its just  a UTI

## 2023-04-11 NOTE — ED Provider Notes (Signed)
 [facility] EM[facility]  EMERGENCY DEPARTMENT ENCOUNTER      Pt Name: Laurie Miller  MRN: [id number]  Birthdate 10-12-1960  Date of evaluation: 04/11/2023  Provider: Campbell Shove, MD  3:01 PM    CHIEF COMPLAINT       Chief Complaint   Patient presents with    Urinary Tract Infection         HISTORY OF PRESENT ILLNESS    Laurie Miller is a 62 y.o. female who presents to the emergency department with report of foul smelling urine for past few days . She thinks that she has a UTI. No fever or dysuria.      HPI    Nursing Notes were reviewed.    REVIEW OF SYSTEMS       Review of Systems   Constitutional: Negative.    HENT: Negative.     Eyes: Negative.    Respiratory: Negative.     Cardiovascular: Negative.    Gastrointestinal: Negative.    Endocrine: Negative.    Genitourinary: Negative.         Foul smelling urine    Musculoskeletal: Negative.    Allergic/Immunologic: Negative.    Neurological: Negative.    Hematological: Negative.    Psychiatric/Behavioral: Negative.         Except as noted above the remainder of the review of systems was reviewed and negative.       PAST MEDICAL HISTORY     Past Medical History:   Diagnosis Date    Bursitis     CKD (chronic kidney disease)     Fatty tumor     Hypertension          SURGICAL HISTORY       Past Surgical History:   Procedure Laterality Date    ORTHOPEDIC SURGERY           CURRENT MEDICATIONS       Previous Medications    ACETAMINOPHEN  (TYLENOL ) 500 MG TABLET    Take 1 tablet by mouth every 6 hours as needed for Pain    BUTALBITAL -APAP-CAFFEINE  (FIORICET ) 50-300-40 MG CAPS PER CAPSULE    Take 1 capsule by mouth every 4 hours as needed for Headaches    DIAZEPAM (VALIUM) 2 MG TABLET    Take 5 mg by mouth every 8 hours as needed.    FLUTICASONE  (FLONASE ) 50 MCG/ACT NASAL SPRAY    2 sprays by Each Nostril route daily    IBUPROFEN  (ADVIL ;MOTRIN ) 600 MG TABLET    Take 1 tablet by mouth 4 times daily as needed for Pain    KETOROLAC  (TORADOL ) 10 MG TABLET    Take 10 mg by mouth every 6 hours  as needed    LIDOCAINE  3.5 % PTCH    Apply 1 patch topically    ONDANSETRON  (ZOFRAN ) 4 MG TABLET    Take 1 tablet by mouth 3 times daily as needed for Nausea or Vomiting    PROMETHAZINE (PHENERGAN) 50 MG/ML INJECTION    by NOT APPLICABLE route       ALLERGIES     Aspirin, Celecoxib, Gabapentin, Hydrocodone-acetaminophen , Ibuprofen , Ketorolac , Meperidine, Naproxen, Nsaids, Oxycodone -acetaminophen , Tramadol , Ubrogepant, Clarithromycin, Penicillins, and Sulfa antibiotics    FAMILY HISTORY     History reviewed. No pertinent family history.       SOCIAL HISTORY       Social History     Socioeconomic History    Marital status: Married     Spouse name: None    Number of  children: None    Years of education: None    Highest education level: None   Tobacco Use    Smoking status: Never    Smokeless tobacco: Never   Substance and Sexual Activity    Alcohol use: Not Currently       SCREENINGS         Glasgow Coma Scale  Eye Opening: Spontaneous  Best Verbal Response: Oriented  Best Motor Response: Obeys commands  Glasgow Coma Scale Score: 15                     CIWA Assessment  BP: (!) 146/86  Pulse: 74                 PHYSICAL EXAM       ED Triage Vitals [04/11/23 1443]   BP Systolic BP Percentile Diastolic BP Percentile Temp Temp src Pulse Respirations SpO2   (!) 146/86 -- -- 97.8 F (36.6 C) -- 74 18 100 %      Height Weight - Scale         1.702 m (5' 7) 80.2 kg (176 lb 11.2 oz)             Physical Exam  Vitals and nursing note reviewed.   HENT:      Head: Normocephalic and atraumatic.      Nose: Nose normal.   Eyes:      Extraocular Movements: Extraocular movements intact.      Pupils: Pupils are equal, round, and reactive to light.   Cardiovascular:      Rate and Rhythm: Normal rate and regular rhythm.   Pulmonary:      Breath sounds: Normal breath sounds.   Abdominal:      General: Abdomen is flat. Bowel sounds are normal.      Palpations: Abdomen is soft.   Musculoskeletal:         General: Normal range of motion.       Cervical back: Normal range of motion and neck supple.   Skin:     General: Skin is warm.   Neurological:      General: No focal deficit present.      Mental Status: She is alert.   Psychiatric:         Mood and Affect: Mood normal.         DIAGNOSTIC RESULTS     EKG: All EKG's are interpreted by the Emergency Department Physician who either signs or Co-signs this chart in the absence of a cardiologist.        RADIOLOGY:   Non-plain film images such as CT, Ultrasound and MRI are read by the radiologist. Plain radiographic images are visualized and preliminarily interpreted by the emergency physician with the below findings:        Interpretation per the Radiologist below, if available at the time of this note:    No orders to display         ED BEDSIDE ULTRASOUND:   Performed by ED Physician - none    LABS:  Labs Reviewed   URINALYSIS WITH REFLEX TO CULTURE       All other labs were within normal range or not returned as of this dictation.    EMERGENCY DEPARTMENT COURSE and DIFFERENTIAL DIAGNOSIS/MDM:   Vitals:    Vitals:    04/11/23 1443   BP: (!) 146/86   Pulse: 74   Resp: 18   Temp: 97.8 F (36.6 C)  SpO2: 100%   Weight: 80.2 kg (176 lb 11.2 oz)   Height: 1.702 m (5' 7)           Medical Decision Making  Amount and/or Complexity of Data Reviewed  Labs: ordered.            REASSESSMENT          CRITICAL CARE TIME   Total Critical Care time was 0 minutes, excluding separately reportable procedures.  There was a high probability of clinically significant/life threatening deterioration in the patient's condition which required my urgent intervention.      CONSULTS:  None    PROCEDURES:  Unless otherwise noted below, none     Procedures        FINAL IMPRESSION    No diagnosis found.      DISPOSITION/PLAN   DISPOSITION    Condition at Disposition: Data Unavailable      PATIENT REFERRED TO:  No follow-up provider specified.    DISCHARGE MEDICATIONS:  New Prescriptions    No medications on file     Controlled  Substances Monitoring:          No data to display                (Please note that portions of this note were completed with a voice recognition program.  Efforts were made to edit the dictations but occasionally words are mis-transcribed.)    Brain Victory, MD (electronically signed)  Attending Emergency Physician           Victory Rogue, MD  04/11/23 [phone]

## 2023-04-19 ENCOUNTER — Inpatient Hospital Stay
Admit: 2023-04-19 | Discharge: 2023-04-19 | Disposition: A | Discharge status: Home / Self Care | Payer: MEDICAID | Attending: Emergency Medicine

## 2023-04-19 DIAGNOSIS — H8110 Benign paroxysmal vertigo, unspecified ear: Secondary | ICD-10-CM

## 2023-04-19 LAB — COMPREHENSIVE METABOLIC PANEL
ALT: 19 U/L (ref 12–78)
AST: 14 U/L — ABNORMAL LOW (ref 15–37)
Albumin/Globulin Ratio: 1.1 (ref 1.1–2.2)
Albumin: 3.5 g/dL (ref 3.5–5.0)
Alk Phosphatase: 87 U/L (ref 45–117)
Anion Gap: 9 mmol/L (ref 2–12)
BUN/Creatinine Ratio: 19 (ref 12–20)
BUN: 14 mg/dL (ref 6–20)
CO2: 26 mmol/L (ref 21–32)
Calcium: 8.8 mg/dL (ref 8.5–10.1)
Chloride: 108 mmol/L (ref 97–108)
Creatinine: 0.73 mg/dL (ref 0.55–1.02)
Est, Glom Filt Rate: >90 mL/min/{1.73_m2} (ref >60)
Globulin: 3.1 g/dL (ref 2.0–4.0)
Glucose: 103 mg/dL — ABNORMAL HIGH (ref 65–100)
Potassium: 3.4 mmol/L — ABNORMAL LOW (ref 3.5–5.1)
Sodium: 143 mmol/L (ref 136–145)
Total Bilirubin: 0.4 mg/dL (ref 0.2–1.0)
Total Protein: 6.6 g/dL (ref 6.4–8.2)

## 2023-04-19 LAB — MAGNESIUM: Magnesium: 2 mg/dL (ref 1.6–2.4)

## 2023-04-19 LAB — URINALYSIS WITH MICROSCOPIC
Bilirubin, Urine: NEGATIVE
Glucose, Ur: NEGATIVE mg/dL
Ketones, Urine: NEGATIVE mg/dL
Leukocyte Esterase, Urine: NEGATIVE
Nitrite, Urine: NEGATIVE
Protein, UA: NEGATIVE mg/dL
Specific Gravity, UA: 1.02 (ref 1.003–1.030)
Urobilinogen, Urine: 0.2 EU/dL (ref 0.2–1.0)
pH, Urine: 6.5 (ref 5.0–8.0)

## 2023-04-19 LAB — CBC WITH AUTO DIFFERENTIAL
Basophils %: 1 % (ref 0–1)
Basophils Absolute: 0.1 10*3/uL (ref 0.0–0.1)
Eosinophils %: 1 % (ref 0–7)
Eosinophils Absolute: 0.1 10*3/uL (ref 0.0–0.4)
Hematocrit: 32.9 % — ABNORMAL LOW (ref 35.0–47.0)
Hemoglobin: 10.4 g/dL — ABNORMAL LOW (ref 11.5–16.0)
Immature Granulocytes %: 1 % — ABNORMAL HIGH (ref 0–0.5)
Immature Granulocytes Absolute: 0.1 10*3/uL — ABNORMAL HIGH (ref 0.00–0.04)
Lymphocytes %: 34 % (ref 12–49)
Lymphocytes Absolute: 1.9 10*3/uL (ref 0.8–3.5)
MCH: 19.8 pg — ABNORMAL LOW (ref 26.0–34.0)
MCHC: 31.6 g/dL (ref 30.0–36.5)
MCV: 62.5 FL — ABNORMAL LOW (ref 80.0–99.0)
MPV: 8.5 FL — ABNORMAL LOW (ref 8.9–12.9)
Monocytes %: 7 % (ref 5–13)
Monocytes Absolute: 0.4 10*3/uL (ref 0.0–1.0)
Neutrophils %: 56 % (ref 32–75)
Neutrophils Absolute: 2.9 10*3/uL (ref 1.8–8.0)
Nucleated RBCs: 0 /100{WBCs}
Platelets: 234 10*3/uL (ref 150–400)
RBC: 5.26 M/uL — ABNORMAL HIGH (ref 3.80–5.20)
RDW: 16.5 % — ABNORMAL HIGH (ref 11.5–14.5)
WBC: 5.5 10*3/uL (ref 3.6–11.0)
nRBC: 0 10*3/uL (ref 0.00–0.01)

## 2023-04-19 MED ORDER — MECLIZINE HCL 25 MG PO TABS
25 | Freq: Once | ORAL | Status: AC
Start: 2023-04-19 — End: 2023-04-19

## 2023-04-19 MED ORDER — ONDANSETRON 4 MG PO TBDP
4 | Freq: Once | ORAL | Status: AC
Start: 2023-04-19 — End: 2023-04-19

## 2023-04-19 MED ORDER — MECLIZINE HCL 25 MG PO TABS
25 | ORAL_TABLET | Freq: Three times a day (TID) | ORAL | 0 refills | Status: AC | PRN
Start: 2023-04-19 — End: 2023-04-29

## 2023-04-19 MED ADMIN — ondansetron (ZOFRAN-ODT) disintegrating tablet 4 mg: 4 mg | SUBLINGUAL | @ 21:00:00 | NDC 68462015740

## 2023-04-19 MED ADMIN — meclizine (ANTIVERT) tablet 50 mg: 50 mg | ORAL | @ 21:00:00 | NDC 00904737661

## 2023-04-19 MED FILL — ONDANSETRON 4 MG PO TBDP: 4 MG | ORAL | Qty: 1

## 2023-04-19 MED FILL — MECLIZINE HCL 25 MG PO TABS: 25 MG | ORAL | Qty: 2

## 2023-04-19 NOTE — ED Provider Notes (Signed)
 [facility] EMERGENCY DEPT  EMERGENCY DEPARTMENT HISTORY AND PHYSICAL EXAM      Date: 04/19/2023  Patient Name: Laurie Miller  MRN: [id number]  Birthdate: 12/19/1960  Date of evaluation: 04/19/2023  Provider: Shaunna Gibes, MD   Note Started: 4:05 PM EDT 04/19/23    HISTORY OF PRESENT ILLNESS     Chief Complaint   Patient presents with    Dizziness              History Provided By: Patient    HPI: Laurie Miller is a 62 y.o. female     PAST MEDICAL HISTORY   Past Medical History:  Past Medical History:   Diagnosis Date    Bursitis     CKD (chronic kidney disease)     Fatty tumor     Hypertension        Past Surgical History:  Past Surgical History:   Procedure Laterality Date    ORTHOPEDIC SURGERY         Family History:  History reviewed. No pertinent family history.    Social History:  Social History     Tobacco Use    Smoking status: Never    Smokeless tobacco: Never   Substance Use Topics    Alcohol use: Not Currently       Allergies:  Allergies   Allergen Reactions    Aspirin Nausea Only    Celecoxib Nausea Only    Gabapentin     Hydrocodone-Acetaminophen  Nausea Only    Ibuprofen  Nausea Only    Ketorolac  Nausea Only    Meperidine Itching    Naproxen Nausea Only    Nsaids     Oxycodone -Acetaminophen  Itching    Tramadol  Nausea Only    Ubrogepant     Clarithromycin Rash    Penicillins Angioedema and Rash     Hemorrhage    hemorrhage    Sulfa Antibiotics Hives and Rash     Other Reaction(s): Itchy, rash       PCP: No primary care provider on file.    Current Meds:   Current Facility-Administered Medications   Medication Dose Route Frequency Provider Last Rate Last Admin    ondansetron  (ZOFRAN -ODT) disintegrating tablet 4 mg  4 mg SubLINGual Once Benn-Thompson, Beanca Kiester, MD        meclizine  (ANTIVERT ) tablet 50 mg  50 mg Oral Once Benn-Thompson, Atara Paterson, MD         Current Outpatient Medications   Medication Sig Dispense Refill    ondansetron  (ZOFRAN ) 4 MG tablet Take 1 tablet by mouth 3 times daily as needed for Nausea or  Vomiting 15 tablet 0    ibuprofen  (ADVIL ;MOTRIN ) 600 MG tablet Take 1 tablet by mouth 4 times daily as needed for Pain 20 tablet 0    butalbital -APAP-caffeine  (FIORICET ) 50-300-40 MG CAPS per capsule Take 1 capsule by mouth every 4 hours as needed for Headaches 84 capsule 0    acetaminophen  (TYLENOL ) 500 MG tablet Take 1 tablet by mouth every 6 hours as needed for Pain 30 tablet 1    fluticasone  (FLONASE ) 50 MCG/ACT nasal spray 2 sprays by Each Nostril route daily 16 g 0    diazePAM (VALIUM) 2 MG tablet Take 5 mg by mouth every 8 hours as needed.      ketorolac  (TORADOL ) 10 MG tablet Take 10 mg by mouth every 6 hours as needed      Lidocaine  3.5 % PTCH Apply 1 patch topically      promethazine (PHENERGAN) 50 MG/ML injection  by NOT APPLICABLE route         Social Determinants of Health:   Social Determinants of Health     Tobacco Use: Low Risk  (04/19/2023)    Patient History     Smoking Tobacco Use: Never     Smokeless Tobacco Use: Never     Passive Exposure: Not on file   Alcohol Use: Not At Risk (02/09/2023)    Received from [facility]    Alcohol Use     How often do you have a drink containing alcohol?: Never     How many drinks containing alcohol do you have on a typical day when you are drinking?: Not on file     How often do you have 5 or more drinks on one occasion? : Never   Financial Resource Strain: Not on file   Food Insecurity: Not on file   Transportation Needs: Not on file   Physical Activity: Not on file   Stress: Not on file   Social Connections: Not on file   Intimate Partner Violence: Not on file   Depression: Not on file   Housing Stability: Not on file   Interpersonal Safety: Not on file   Utilities: Not on file       PHYSICAL EXAM   Physical Exam  Vitals and nursing note reviewed.   Constitutional:       General: She is not in acute distress.     Appearance: Normal appearance. She is normal weight. She is not ill-appearing, toxic-appearing or diaphoretic.   HENT:      Head: Normocephalic and  atraumatic.      Nose: Nose normal.      Mouth/Throat:      Mouth: Mucous membranes are moist.      Pharynx: No oropharyngeal exudate.   Eyes:      Extraocular Movements: Extraocular movements intact.      Conjunctiva/sclera: Conjunctivae normal.      Pupils: Pupils are equal, round, and reactive to light.   Cardiovascular:      Rate and Rhythm: Normal rate and regular rhythm.      Pulses: Normal pulses.      Heart sounds: Normal heart sounds.   Pulmonary:      Effort: Pulmonary effort is normal.      Breath sounds: Normal breath sounds.   Abdominal:      General: Bowel sounds are normal.      Palpations: Abdomen is soft.      Tenderness: There is no abdominal tenderness.   Musculoskeletal:         General: Normal range of motion.      Cervical back: Normal range of motion and neck supple.   Skin:     General: Skin is warm and dry.      Capillary Refill: Capillary refill takes less than 2 seconds.   Neurological:      General: No focal deficit present.      Mental Status: She is alert and oriented to person, place, and time.      Cranial Nerves: No cranial nerve deficit.      Sensory: No sensory deficit.      Motor: No weakness.      Coordination: Coordination normal.      Gait: Gait normal.   Psychiatric:         Mood and Affect: Mood normal.         Behavior: Behavior normal.  SCREENINGS              LAB, EKG AND DIAGNOSTIC RESULTS   Labs:  No results found for this or any previous visit (from the past 12 hour(s)).    EKG: EKG interpreted by me. Shows Normal Sinus Rhythm with a HR of 73.  No STEMI.    Radiologic Studies:  Non-plain film images such as CT, Ultrasound and MRI are read by the radiologist. Plain radiographic images are visualized and preliminarily interpreted by the ED Physician with the following findings: Not Applicable.    Interpretation per the Radiologist below, if available at the time of this note:  No orders to display        ED COURSE and DIFFERENTIAL DIAGNOSIS/MDM   CC/HPI Summary,  DDx, ED Course, and Reassessment: 62 year old female presents with a complaint of dizziness starting about 0800 hrs. this morning, patient denies any trauma, patient relates history of vertigo but she has not had any episodes for 5 years, patient denies headache denies vomiting but does have nausea denies any vision changes denies any extremity weakness    Records Reviewed (source and summary of external notes): Prior medical records and Nursing notes    Vitals:    Vitals:    04/19/23 1553   BP: (!) 160/91   Pulse: 69   Resp: 18   Temp: 97.2 F (36.2 C)   TempSrc: Oral   SpO2: 97%   Weight: 77.1 kg (170 lb)   Height: 1.702 m (5' 7)        ED COURSE  Meclizine  p.o.  Zofran  p.o.    ED Course as of 04/22/23 1402   Tue Apr 19, 2023   1742 Patient states symptoms of dizziness have resolved, patient states she is at baseline [SB]      ED Course User Index  [SB] Benn-Thompson, Ila Landowski, MD       Disposition Considerations (Tests not done, Shared Decision Making, Pt Expectation of Test or Treatment.): See ED Course    Patient was given the following medications:  Medications   ondansetron  (ZOFRAN -ODT) disintegrating tablet 4 mg (has no administration in time range)   meclizine  (ANTIVERT ) tablet 50 mg (has no administration in time range)       CONSULTS: (Who and What was discussed)  None     Social Determinants affecting Dx or Tx: None    Smoking Cessation: Not Applicable    PROCEDURES   Unless otherwise noted above, none.  Procedures      CRITICAL CARE TIME   Patient does not meet Critical Care Time, 0 minutes    FINAL IMPRESSION   No diagnosis found.      DISPOSITION/PLAN   DISPOSITION    Condition at Disposition: Data Unavailable    Discharge Note: The patient is stable for discharge home. The signs, symptoms, diagnosis, and discharge instructions have been discussed, understanding conveyed, and agreed upon. The patient is to follow up as recommended or return to ER should their symptoms worsen.      PATIENT REFERRED  TO:  No follow-up provider specified.      DISCHARGE MEDICATIONS:     Medication List        ASK your doctor about these medications      acetaminophen  500 MG tablet  Commonly known as: TYLENOL   Take 1 tablet by mouth every 6 hours as needed for Pain     butalbital -APAP-caffeine  50-300-40 MG Caps per capsule  Commonly known as: Fioricet   Take  1 capsule by mouth every 4 hours as needed for Headaches     diazePAM 2 MG tablet  Commonly known as: VALIUM     fluticasone  50 MCG/ACT nasal spray  Commonly known as: FLONASE   2 sprays by Each Nostril route daily     ibuprofen  600 MG tablet  Commonly known as: ADVIL ;MOTRIN   Take 1 tablet by mouth 4 times daily as needed for Pain     ketorolac  10 MG tablet  Commonly known as: TORADOL      Lidocaine  3.5 % Ptch     ondansetron  4 MG tablet  Commonly known as: ZOFRAN   Take 1 tablet by mouth 3 times daily as needed for Nausea or Vomiting     promethazine 50 MG/ML injection  Commonly known as: PHENERGAN                DISCONTINUED MEDICATIONS:  Current Discharge Medication List          I am the Primary Clinician of Record: Shaunna Gibes, MD (electronically signed)    (Please note that parts of this dictation were completed with voice recognition software. Quite often unanticipated grammatical, syntax, homophones, and other interpretive errors are inadvertently transcribed by the computer software. Please disregards these errors. Please excuse any errors that have escaped final proofreading.)     Gibes Shaunna, MD  04/22/23 1403

## 2023-04-19 NOTE — ED Triage Notes (Signed)
 Reports dizziness that started 0800 this am, has hx of vertigo but does not take meds, also has some nausea

## 2023-04-19 NOTE — ED Notes (Signed)
 Pt refused IV- ED tech attempted labs- pt starting moving around- unable to obtain. Phlebot at bedside to draw labs

## 2023-04-20 LAB — EKG 12-LEAD
Atrial Rate: 73 {beats}/min
P Axis: 48 degrees
P-R Interval: 157 ms
Q-T Interval: 408 ms
QRS Duration: 99 ms
QTc Calculation (Bazett): 450 ms
R Axis: 48 degrees
T Axis: 42 degrees
Ventricular Rate: 73 {beats}/min

## 2023-06-21 ENCOUNTER — Encounter: Payer: Medicaid (Managed Care) | Attending: Family

## 2023-07-18 ENCOUNTER — Inpatient Hospital Stay
Admit: 2023-07-18 | Discharge: 2023-07-18 | Disposition: A | Discharge status: Home / Self Care | Payer: MEDICAID | Admitting: Emergency Medicine

## 2023-07-18 DIAGNOSIS — L237 Allergic contact dermatitis due to plants, except food: Secondary | ICD-10-CM

## 2023-07-18 DIAGNOSIS — R21 Rash and other nonspecific skin eruption: Secondary | ICD-10-CM

## 2023-07-18 MED ORDER — BUTALBITAL-APAP-CAFFEINE 50-300-40 MG PO CAPS
50-300-40 | ORAL_CAPSULE | ORAL | 0 refills | Status: AC | PRN
Start: 2023-07-18 — End: ?

## 2023-07-18 MED ORDER — BUTALBITAL-APAP-CAFFEINE 50-325-40 MG PO TABS
50-325-40 | ORAL | Status: AC
Start: 2023-07-18 — End: 2023-07-18
  Administered 2023-07-18: 15:00:00 2 via ORAL

## 2023-07-18 MED ORDER — PREDNISONE 20 MG PO TABS
20 | Freq: Every day | ORAL | Status: DC
Start: 2023-07-18 — End: 2023-07-18
  Administered 2023-07-18: 15:00:00 40 mg via ORAL

## 2023-07-18 MED ORDER — PREDNISONE 20 MG PO TABS
20 | ORAL_TABLET | ORAL | 0 refills | Status: AC
Start: 2023-07-18 — End: ?

## 2023-07-18 MED FILL — BUTALBITAL-APAP-CAFFEINE 50-325-40 MG PO TABS: 50-325-40 MG | ORAL | Qty: 2 | Fill #0

## 2023-07-18 MED FILL — PREDNISONE 20 MG PO TABS: 20 MG | ORAL | Qty: 2 | Fill #0

## 2023-07-18 NOTE — ED Notes (Signed)
Headache a little better.

## 2023-07-18 NOTE — ED Provider Notes (Signed)
 [facility] EMERGENCY DEPT  EMERGENCY DEPARTMENT HISTORY AND PHYSICAL EXAM      Date: 07/18/2023  Patient Name: Laurie Miller  MRN: [id number]  Birthdate: 10/26/1960  Date of evaluation: 07/18/2023  Provider: Marquette Old, MD   Note Started: 9:29 AM EST 07/18/23    HIST

## 2023-07-18 NOTE — ED Triage Notes (Signed)
 Reports itchy rash to right AC and right side x1 week, using OTC meds without relief, also requests refill of Stadol for migraines, next neurology appt Feb 2025.

## 2023-07-18 NOTE — Discharge Instructions (Signed)
 Thank you for choosing our Emergency Department for your care.  It is our privilege to care for you in your time of need.  In the next several days, you may receive a survey via email or mailed to your home about your experience with our team.  We woul

## 2023-07-22 ENCOUNTER — Inpatient Hospital Stay
Admit: 2023-07-22 | Discharge: 2023-07-22 | Disposition: A | Discharge status: Home / Self Care | Payer: MEDICAID | Attending: Emergency Medicine

## 2023-07-22 DIAGNOSIS — L237 Allergic contact dermatitis due to plants, except food: Secondary | ICD-10-CM

## 2023-07-22 DIAGNOSIS — R21 Rash and other nonspecific skin eruption: Secondary | ICD-10-CM

## 2023-07-22 MED ORDER — HYDROCORTISONE 2 % EX LOTN
2 | CUTANEOUS | 0 refills | Status: AC
Start: 2023-07-22 — End: ?

## 2023-07-22 NOTE — ED Triage Notes (Signed)
 Patient presents for complaint of redness to R antecubital.  States it began as poison oak.  She was prescribed prednisone, and feels like the redness became worse. She suspects she is having an allergic reaction to prednisone.  Patient then stated she has

## 2023-07-22 NOTE — Discharge Instructions (Addendum)
 Thank you for choosing our Emergency Department for your care.  It is our privilege to care for you in your time of need.  In the next several days, you may receive a survey via email or mailed to your home about your experience with our team.  We woul

## 2023-07-22 NOTE — ED Provider Notes (Signed)
 [facility] EMERGENCY DEPT  EMERGENCY DEPARTMENT HISTORY AND PHYSICAL EXAM      Date: 07/22/2023  Patient Name: Laurie Miller  MRN: [id number]  Birthdate: [birth year]  Date of evaluation: 07/22/2023  Provider: Marquette Old, MD   Note Started: 3:54 AM EST 07/25/23    H

## 2023-07-29 ENCOUNTER — Inpatient Hospital Stay
Admit: 2023-07-29 | Discharge: 2023-07-29 | Disposition: A | Discharge status: Home / Self Care | Payer: Medicaid (Managed Care) | Admitting: Emergency Medicine

## 2023-07-29 DIAGNOSIS — S39012A Strain of muscle, fascia and tendon of lower back, initial encounter: Secondary | ICD-10-CM

## 2023-07-29 LAB — URINALYSIS WITH MICROSCOPIC
BACTERIA, URINE: NEGATIVE /[HPF]
Bilirubin, Urine: NEGATIVE
Glucose, Ur: NEGATIVE mg/dL
Ketones, Urine: NEGATIVE mg/dL
Nitrite, Urine: NEGATIVE
Protein, UA: NEGATIVE mg/dL
Specific Gravity, UA: 1.015 (ref 1.003–1.030)
Urobilinogen, Urine: 0.2 U/dL (ref 0.2–1.0)
pH, Urine: 6.5 (ref 5.0–8.0)

## 2023-07-29 MED ORDER — KETOROLAC TROMETHAMINE 30 MG/ML IJ SOLN
30 | INTRAMUSCULAR | Status: AC
Start: 2023-07-29 — End: 2023-07-29
  Administered 2023-07-29: 07:00:00 30 mg via INTRAMUSCULAR

## 2023-07-29 MED ORDER — OXYCODONE-ACETAMINOPHEN 5-325 MG PO TABS
5-325 | ORAL | Status: AC
Start: 2023-07-29 — End: 2023-07-29
  Administered 2023-07-29: 07:00:00 1 via ORAL

## 2023-07-29 MED FILL — KETOROLAC TROMETHAMINE 30 MG/ML IJ SOLN: 30 MG/ML | INTRAMUSCULAR | Qty: 1

## 2023-07-29 MED FILL — OXYCODONE-ACETAMINOPHEN 5-325 MG PO TABS: 5-325 MG | ORAL | Qty: 1

## 2023-07-29 NOTE — ED Triage Notes (Signed)
 Pt states that she has been having lower back pain since Monday. Pt rates pain 9/10 and hasn't taken any medications for the pain today.

## 2023-07-29 NOTE — ED Provider Notes (Deleted)
 Brief Patient Assessment:    I conducted a brief primary assessment of the patient's presenting complaints, vital signs, and clinical condition prior to the shift change to ensure patient safety, condition stability, and timely initiation of the necessary

## 2023-07-29 NOTE — ED Notes (Signed)
 D/C home, ambulatory, with family and responsible driver. D/C instructions, medications, work note, and follow up reviewed; understanding verbalized. A&Ox4.

## 2023-07-29 NOTE — Discharge Instructions (Addendum)
 Avoid heavy lifting excessive bending and twisting.  Follow-up primary care doctor and the morning if symptoms get worse return to the ED.  Take medication as prescribed for pain.      Thank you for choosing our Emergency Department for your care.  It is o

## 2023-07-29 NOTE — ED Provider Notes (Addendum)
 [facility] EMERGENCY DEPT  EMERGENCY DEPARTMENT HISTORY AND PHYSICAL EXAM      Date: 07/29/2023  Patient Name: Laurie Miller  MRN: [id number]  Birthdate: 08-Oct-1960  Date of evaluation: 07/29/2023  Provider: Matthias Hughs, MD   Note Started: 6:26 AM EST 08/02/23

## 2023-08-12 ENCOUNTER — Inpatient Hospital Stay: Admit: 2023-08-12 | Discharge: 2023-08-12 | Disposition: A | Discharge status: Home / Self Care | Payer: MEDICAID

## 2023-08-12 DIAGNOSIS — G43919 Migraine, unspecified, intractable, without status migrainosus: Secondary | ICD-10-CM

## 2023-08-12 MED ORDER — METOCLOPRAMIDE HCL 10 MG PO TABS
10 | Freq: Once | ORAL | Status: AC
Start: 2023-08-12 — End: 2023-08-12
  Administered 2023-08-12: 19:00:00 10 mg via ORAL

## 2023-08-12 MED ORDER — BUTALBITAL-APAP-CAFFEINE 50-325-40 MG PO TABS
50-325-40 | ORAL | Status: AC
Start: 2023-08-12 — End: 2023-08-12
  Administered 2023-08-12: 19:00:00 2 via ORAL

## 2023-08-12 MED ORDER — BUTALBITAL-APAP-CAFFEINE 50-325-40 MG PO TABS
50-325-40 | ORAL_TABLET | ORAL | 0 refills | Status: AC | PRN
Start: 2023-08-12 — End: ?

## 2023-08-12 MED FILL — METOCLOPRAMIDE HCL 10 MG PO TABS: 10 MG | ORAL | Qty: 1

## 2023-08-12 MED FILL — BUTALBITAL-APAP-CAFFEINE 50-325-40 MG PO TABS: 50-325-40 MG | ORAL | Qty: 2

## 2023-08-12 NOTE — ED Provider Notes (Signed)
 [facility]  EMERGENCY DEPARTMENT HISTORY AND PHYSICAL EXAM      Date: 08/12/2023  Patient Name: Laurie Miller  MRN: [id number]  Birthdate: Dec 26, 1960  Date of evaluation: 08/12/2023  Provider: Maudie CHRISTELLA Ditch, PA-C   Note Started: 2:11 PM EST 08/12/23    HISTORY OF PRESENT ILLNESS     Chief Complaint   Patient presents with    Migraine       History Provided By: Patient    HPI: Laurie Miller is a 63 y.o. female with past medical history listed below, presents for evaluation of migraine.  Patient states that she has been unable to get her migraine medication filled for about 2 years now after she lost her primary care provider.  Patient states that she normally takes Stadol  intranasally.  She has tried over-the-counter medication without significant improvement of symptoms.  She endorses that this feels similar to all previous migraines, no new or worsening symptoms.  No trauma.    PAST MEDICAL HISTORY   Past Medical History:  Past Medical History:   Diagnosis Date    Bursitis     CKD (chronic kidney disease)     Fatty tumor     Hypertension     Migraine        Past Surgical History:  Past Surgical History:   Procedure Laterality Date    ORTHOPEDIC SURGERY         Family History:  No family history on file.    Social History:  Social History     Tobacco Use    Smoking status: Never     Passive exposure: Never    Smokeless tobacco: Never   Vaping Use    Vaping status: Never Used   Substance Use Topics    Alcohol use: Not Currently    Drug use: Never       Allergies:  Allergies   Allergen Reactions    Aspirin Nausea Only    Celecoxib Nausea Only    Gabapentin     Hydrocodone-Acetaminophen  Nausea Only    Ibuprofen  Nausea Only    Ketorolac  Nausea Only    Meperidine Itching    Naproxen Nausea Only    Nsaids     Oxycodone -Acetaminophen  Itching    Tramadol  Nausea Only    Ubrogepant     Clarithromycin Rash    Penicillins Angioedema and Rash     Hemorrhage    hemorrhage    Sulfa Antibiotics Hives and Rash     Other Reaction(s):  Itchy, rash       PCP: No primary care provider on file.    Current Meds:   No current facility-administered medications for this encounter.     Current Outpatient Medications   Medication Sig Dispense Refill    butalbital -acetaminophen -caffeine  (FIORICET , ESGIC ) 50-325-40 MG per tablet Take 1 tablet by mouth every 4 hours as needed for Headaches 12 tablet 0    amLODIPine-olmesartan (AZOR) 10-40 MG per tablet Take 1 tablet by mouth daily      tiZANidine (ZANAFLEX) 2 MG tablet Take 1 tablet by mouth daily as needed      COMBIGAN 0.2-0.5 % ophthalmic solution instill 1 DROP IN EACH EYE TWICE DAILY      hydrocortisone  (SCALACORT) 2 % LOTN lotion Apply to affected area twice daily 1 each 0    predniSONE  (DELTASONE ) 20 MG tablet Take 40 mg (2 tab) x 4 days, then 20 mg (1 tab) x 4 days, then 10 mg (1/2 tab) x 4  days 14 tablet 0    butalbital -APAP-caffeine  (FIORICET ) 50-300-40 MG CAPS per capsule Take 1 capsule by mouth every 4 hours as needed for Headaches 15 capsule 0    ondansetron  (ZOFRAN ) 4 MG tablet Take 1 tablet by mouth 3 times daily as needed for Nausea or Vomiting 15 tablet 0    ibuprofen  (ADVIL ;MOTRIN ) 600 MG tablet Take 1 tablet by mouth 4 times daily as needed for Pain 20 tablet 0    acetaminophen  (TYLENOL ) 500 MG tablet Take 1 tablet by mouth every 6 hours as needed for Pain 30 tablet 1    fluticasone  (FLONASE ) 50 MCG/ACT nasal spray 2 sprays by Each Nostril route daily 16 g 0    diazePAM (VALIUM) 2 MG tablet Take 5 mg by mouth every 8 hours as needed.      ketorolac  (TORADOL ) 10 MG tablet Take 10 mg by mouth every 6 hours as needed      Lidocaine  3.5 % PTCH Apply 1 patch topically      promethazine (PHENERGAN) 50 MG/ML injection by NOT APPLICABLE route         Social Determinants of Health:   Social Determinants of Health     Tobacco Use: Low Risk  (07/29/2023)    Patient History     Smoking Tobacco Use: Never     Smokeless Tobacco Use: Never     Passive Exposure: Never   Alcohol Use: Patient Declined  (07/29/2023)    AUDIT-C     Frequency of Alcohol Consumption: Patient declined     Average Number of Drinks: Patient declined     Frequency of Binge Drinking: Patient declined   Physicist, Medical Strain: Not on file   Food Insecurity: Not on file   Transportation Needs: Not on file   Physical Activity: Not on file   Stress: Not on file   Social Connections: Not on file   Intimate Partner Violence: Not on file   Depression: Not on file   Housing Stability: Not on file   Interpersonal Safety: Not At Risk (07/29/2023)    Interpersonal Safety Domain Source: IP Abuse Screening     Physical abuse: Denies     Verbal abuse: Denies     Emotional abuse: Denies     Financial abuse: Denies     Sexual abuse: Denies   Utilities: Not on file       PHYSICAL EXAM   Physical Exam  Vitals and nursing note reviewed.   Constitutional:       General: She is not in acute distress.     Appearance: Normal appearance. She is not ill-appearing.   HENT:      Head: Normocephalic and atraumatic.      Mouth/Throat:      Mouth: Mucous membranes are moist.   Eyes:      Extraocular Movements: Extraocular movements intact.      Pupils: Pupils are equal, round, and reactive to light.      Comments: No nystagmus, no photophobia.   Cardiovascular:      Rate and Rhythm: Normal rate and regular rhythm.      Pulses: Normal pulses.      Heart sounds: Normal heart sounds.   Pulmonary:      Effort: Pulmonary effort is normal. No respiratory distress.      Breath sounds: Normal breath sounds. No wheezing.   Musculoskeletal:         General: Normal range of motion.   Skin:     General:  Skin is warm.      Capillary Refill: Capillary refill takes less than 2 seconds.   Neurological:      General: No focal deficit present.      Mental Status: She is alert and oriented to person, place, and time. Mental status is at baseline.      Cranial Nerves: No cranial nerve deficit.      Motor: No weakness.      Gait: Gait normal.       SCREENINGS                  LAB, EKG  AND DIAGNOSTIC RESULTS   Labs:  No results found for this or any previous visit (from the past 12 hour(s)).    EKG: Not Applicable    Radiologic Studies:  Non-plain film images such as CT, Ultrasound and MRI are read by the radiologist. Plain radiographic images are visualized and preliminarily interpreted by the ED Physician with the following findings: Not Applicable.    Interpretation per the Radiologist below, if available at the time of this note:  No orders to display        ED COURSE and DIFFERENTIAL DIAGNOSIS/MDM   5:21 PM Differential and Considerations:     Patient presents with headache similar to prior migraine headaches in past.  DDx: migraine, cluster HA, tension HA, dehydration, lack of proper sleep.  Less likely pseudotumor cerebri, SAH, ICH, cerebral dural venous thrombosis, or meningitis given the course, story and physical exam.  Patient states that she does not want a migraine cocktail, will give Fioricet  and Reglan .    Records Reviewed (source and summary of external notes): Prior medical records and Nursing notes    Vitals:    Vitals:    08/12/23 1350 08/12/23 1415 08/12/23 1530   BP: (!) 167/85 (!) 148/85 (!) 148/85   Pulse: 78 76 74   Resp: 18 18 18    Temp: 97.6 F (36.4 C) 98.2 F (36.8 C) 98 F (36.7 C)   TempSrc: Oral Oral Oral   SpO2: 97%  97%   Weight: 81.2 kg (179 lb)     Height: 1.702 m (5' 7)          ED COURSE  ED Course as of 08/12/23 1723   Fri Aug 12, 2023   1531 Patient reassessed, states that she feels much better after Fioricet  administration.  Will plan to discharge her home with this, she has a follow-up with a neurologist in approximately 5 days and I stressed importance of keeping this.  Red flag and return precautions were discussed with patient regards any new or worsening symptoms.  Patient verbalized understanding is in agreement with the treatment plan in place and disposition home at this time. [CL]      ED Course User Index  [CL] Rodgers Maudie HERO, PA-C        SEPSIS Reassessment: This patient does NOT meet Sepsis criteria after ED workup    Clinical Management Tools:  Not Applicable    Patient was given the following medications:  Medications   butalbital -acetaminophen -caffeine  (FIORICET , ESGIC ) per tablet 2 tablet (2 tablets Oral Given 08/12/23 1428)   metoclopramide  (REGLAN ) tablet 10 mg (10 mg Oral Given 08/12/23 1428)       CONSULTS: See ED Course/MDM for further details.  None     Social Determinants affecting Diagnosis/Treatment: None    Smoking Cessation: Not Applicable    PROCEDURES   Unless otherwise noted above, none.  Procedures  CRITICAL CARE TIME   Patient does not meet Critical Care Time, 0 minutes    ED IMPRESSION     1. Intractable migraine without status migrainosus, unspecified migraine type          DISPOSITION/PLAN   DISPOSITION Decision To Discharge 08/12/2023 03:13:31 PM   DISPOSITION CONDITION Stable        Discharge Note: The patient is stable for discharge home. The signs, symptoms, diagnosis, and discharge instructions have been discussed, understanding conveyed, and agreed upon. The patient is to follow up as recommended or return to ER should their symptoms worsen.      PATIENT REFERRED TO:  Your Neurologist              DISCHARGE MEDICATIONS:     Medication List        CHANGE how you take these medications      * butalbital -APAP-caffeine  50-300-40 MG Caps per capsule  Commonly known as: Fioricet   Take 1 capsule by mouth every 4 hours as needed for Headaches  What changed: Another medication with the same name was added. Make sure you understand how and when to take each.     * butalbital -acetaminophen -caffeine  50-325-40 MG per tablet  Commonly known as: FIORICET , ESGIC   Take 1 tablet by mouth every 4 hours as needed for Headaches  What changed: You were already taking a medication with the same name, and this prescription was added. Make sure you understand how and when to take each.           * This list has 2 medication(s) that are  the same as other medications prescribed for you. Read the directions carefully, and ask your doctor or other care provider to review them with you.                ASK your doctor about these medications      acetaminophen  500 MG tablet  Commonly known as: TYLENOL   Take 1 tablet by mouth every 6 hours as needed for Pain     amLODIPine-olmesartan 10-40 MG per tablet  Commonly known as: AZOR     Combigan 0.2-0.5 % ophthalmic solution  Generic drug: brimonidine-timolol     diazePAM 2 MG tablet  Commonly known as: VALIUM     fluticasone  50 MCG/ACT nasal spray  Commonly known as: FLONASE   2 sprays by Each Nostril route daily     hydrocortisone  2 % Lotn lotion  Commonly known as: SCALACORT  Apply to affected area twice daily     ibuprofen  600 MG tablet  Commonly known as: ADVIL ;MOTRIN   Take 1 tablet by mouth 4 times daily as needed for Pain     ketorolac  10 MG tablet  Commonly known as: TORADOL      Lidocaine  3.5 % Ptch     ondansetron  4 MG tablet  Commonly known as: ZOFRAN   Take 1 tablet by mouth 3 times daily as needed for Nausea or Vomiting     predniSONE  20 MG tablet  Commonly known as: DELTASONE   Take 40 mg (2 tab) x 4 days, then 20 mg (1 tab) x 4 days, then 10 mg (1/2 tab) x 4 days     promethazine 50 MG/ML injection  Commonly known as: PHENERGAN     tiZANidine 2 MG tablet  Commonly known as: ZANAFLEX               Where to Get Your Medications        These medications were sent  to [facility] [place], [place] - [address] - [place] [phone] [place] [phone]  [address] [place] [address]      Phone: [phone]   butalbital -acetaminophen -caffeine  50-325-40 MG per tablet           DISCONTINUED MEDICATIONS:  Discharge Medication List as of 08/12/2023  3:15 PM          I am the Primary Clinician of Record: Maudie CHRISTELLA Ditch, PA-C (electronically signed)    (Please note that parts of this dictation were completed with voice recognition software. Quite often unanticipated  grammatical, syntax, homophones, and other interpretive errors are inadvertently transcribed by the computer software. Please disregards these errors. Please excuse any errors that have escaped final proofreading.)     Ditch Maudie CHRISTELLA, [place]  08/12/23 1723

## 2023-08-12 NOTE — ED Triage Notes (Signed)
 Patient arrives with complaints of a migraine patient says that dr cooper was her previous neurologist for migraines she states that she used to be prescribed a nasal stadol and phenergan.

## 2023-08-24 NOTE — Progress Notes (Signed)
Formatting of this note is different from the original.  Images from the original note were not included.      [facility]  [address]  [place], [place] [address]  Phone: [phone] / Fax: [phone]    08/24/2023  8:24 AM     (This note is a companion to clinical staff notes. History of present illness, past histories, review of systems and other documentation by clinical staff have been reviewed with patient. Updates are recorded in the relevant chart sections and/or noted below.)    Laurie Miller 1960-11-03     Chief Complaint:  Chief Complaint   Patient presents with    Cough     "Bad cold about 3 weeks ago"  Cough continues.      Allergies:  Allergies[1]      Subjective:  Review of Systems   Constitutional:  Negative for fever.   HENT:  Negative for congestion.    Respiratory:  Positive for cough (swallows mucus before she can spit it out). Negative for shortness of breath.    Gastrointestinal:  Negative for diarrhea and vomiting.     Objective:  BP 144/89   Pulse 91   Temp 97.6 F (36.4 C) (Oral)   Resp 18   Ht 5\' 7"  (1.702 m)   Wt 179 lb (81.2 kg)   SpO2 97%   BMI 28.04 kg/m      Physical Exam  Vitals and nursing note reviewed.   Constitutional:       General: She is not in acute distress.     Appearance: Normal appearance. She is well-developed. She is not ill-appearing, toxic-appearing or diaphoretic.   HENT:      Head: Normocephalic and atraumatic.      Right Ear: Tympanic membrane, ear canal and external ear normal.      Left Ear: Tympanic membrane, ear canal and external ear normal.      Nose: Nose normal.      Mouth/Throat:      Mouth: Mucous membranes are moist.      Pharynx: No oropharyngeal exudate or posterior oropharyngeal erythema.   Eyes:      Extraocular Movements: Extraocular movements intact.      Conjunctiva/sclera: Conjunctivae normal.   Cardiovascular:      Rate and Rhythm: Normal rate and regular rhythm.      Pulses: Normal pulses.      Heart sounds:  Normal heart sounds. No murmur heard.  Pulmonary:      Effort: Pulmonary effort is normal. No respiratory distress.      Breath sounds: Normal breath sounds. No stridor. No wheezing, rhonchi or rales.   Abdominal:      Palpations: Abdomen is soft.   Musculoskeletal:         General: Normal range of motion.      Cervical back: Normal range of motion and neck supple.   Skin:     General: Skin is warm and dry.      Capillary Refill: Capillary refill takes less than 2 seconds.   Neurological:      General: No focal deficit present.      Mental Status: She is alert and oriented to person, place, and time.   Psychiatric:         Mood and Affect: Mood normal.         Behavior: Behavior normal.     Diagnosis/Orders:    1. Acute respiratory infection  2. Acute cough    HPI/Plan:   Here today c/o cough for the past 3 weeks. Denies other resp symptoms and fever. Says she starting taking Cipro 2-3 days ago for a bladder infection. Has been taking Mucinex and has tried Printmaker without relief. Lungs clear.     Continue Cipro  Cough med rx sent to the pharmacy, stop Tessalon  Get plenty of fluids  Get plenty of rest  Cool air humidifier  Continue Mucinex   Tylenol, as directed on bottle, for pain or fever if needed  F/U if symptoms worsen or fail to improve with medication       Portions of this note may have been dictated using voice recognition software.  Variances or errors in spelling and vocabulary are possible and unintentional.  Not all errors may have been identified and corrected.  Please notify the [facility] if any discrepancies are noted, or if the meaning of any statement is unclear.    MDM   Please see encounter summary for additional information to support billing. Activities included all or some of the following: preparing to see the patient (e.g. review of tests), obtaining and/or reviewing a separately obtained history, performing a medically appropriate exam and/or evaluation, counseling and educating the  patient/family/caregiver, ordering medications, tests, or procedures, referring and communicating with other health care professionals (when not separately reported), documenting clinical information in the electronic or other health record, independently interpreting results (not separately reported) and communicating results to the patient/family/caregiver, and/or care coordination (not separately reported).     Lethea Killings, FNP    [facility]  Electronically signed: 08/24/2023 8:40 AM       [1]   Allergies  Allergen Reactions    Aspirin Nausea Only and Hives    Aspirnil Unknown    Celecoxib Nausea Only    Clarithromycin Rash    Gabapentin     Hydrocodone-Acetaminophen Hives and Nausea Only    Ibuprofen Hives and Nausea Only    Ketorolac Nausea Only    Meperidine Unknown    Naproxen Hives and Nausea Only    Nsaids (Non-Steroidal Anti-Inflammatory Drug)     Penicillins Rash and Bleeding     Hemorrhage     Percocet [Oxycodone-Acetaminophen] Unknown    Prednisone Muscle pain    Sulfa (Sulfonamide Antibiotics) Rash    Tramadol Nausea Only    Ubrogepant      Electronically signed by Lethea Killings, FNP at 08/24/2023  8:42 AM EST

## 2023-09-12 ENCOUNTER — Inpatient Hospital Stay
Admit: 2023-09-12 | Discharge: 2023-09-12 | Disposition: A | Discharge status: Home / Self Care | Payer: MEDICAID | Attending: Emergency Medicine

## 2023-09-12 DIAGNOSIS — J40 Bronchitis, not specified as acute or chronic: Secondary | ICD-10-CM

## 2023-09-12 MED ORDER — ALBUTEROL SULFATE HFA 108 (90 BASE) MCG/ACT IN AERS
10890 | Freq: Four times a day (QID) | RESPIRATORY_TRACT | 1 refills | Status: AC | PRN
Start: 2023-09-12 — End: ?

## 2023-09-12 MED ORDER — HYDROCOD POLI-CHLORPHE POLI ER 10-8 MG/5ML PO SUER
10-85 | Freq: Every evening | ORAL | 0 refills | Status: AC | PRN
Start: 2023-09-12 — End: 2023-09-17

## 2023-09-12 NOTE — ED Triage Notes (Signed)
Pt has persistent dry cough. Went to Urgent Care 3 weeks ago- did not fill because Medicaid would not cover med- and she did not have money to pay out of pocket.

## 2023-09-12 NOTE — ED Provider Notes (Signed)
[facility] EMERG[facility]Y DEPT  EMERGENCY DEPARTMENT HISTORY AND PHYSICAL EXAM      Date: 09/12/2023  Patient Name: Laurie Miller  MRN: [id number]  Birthdate: [birth year]  Date of evaluation: 09/12/2023  Provider: Marquette Old, MD   Note Started: 8:26 AM EST 09/12/23    HISTORY OF PRESENT ILLNESS     Chief Complaint   Patient presents with    Cough       History Provided By: Patient    HPI: Laurie Miller is a 63 y.o. female presents with 3 weeks of dry cough, that is worse at night. She also notes a flu exposure about one week ago but denies fevers, chills, body aches, NVD.    PAST MEDICAL HISTORY   Past Medical History:  Past Medical History:   Diagnosis Date    Bursitis     CKD (chronic kidney disease)     Fatty tumor     Hypertension     Migraine        Past Surgical History:  Past Surgical History:   Procedure Laterality Date    ORTHOPEDIC SURGERY         Family History:  History reviewed. No pertinent family history.    Social History:  Social History     Tobacco Use    Smoking status: Never     Passive exposure: Never    Smokeless tobacco: Never   Vaping Use    Vaping status: Never Used   Substance Use Topics    Alcohol use: Not Currently    Drug use: Never       Allergies:  Allergies   Allergen Reactions    Aspirin Nausea Only    Celecoxib Nausea Only    Gabapentin     Hydrocodone-Acetaminophen Nausea Only    Ibuprofen Nausea Only    Ketorolac Nausea Only    Meperidine Itching    Naproxen Nausea Only    Nsaids     Oxycodone-Acetaminophen Itching    Tramadol Nausea Only    Ubrogepant     Clarithromycin Rash    Penicillins Angioedema and Rash     Hemorrhage    hemorrhage    Sulfa Antibiotics Hives and Rash     Other Reaction(s): Itchy, rash       PCP: No primary care provider on file.    Current Meds:   No current facility-administered medications for this encounter.     Current Outpatient Medications   Medication Sig Dispense Refill    butalbital-acetaminophen-caffeine (FIORICET, ESGIC) 50-325-40 MG per tablet Take 1 tablet by mouth  every 4 hours as needed for Headaches 12 tablet 0    amLODIPine-olmesartan (AZOR) 10-40 MG per tablet Take 1 tablet by mouth daily      tiZANidine (ZANAFLEX) 2 MG tablet Take 1 tablet by mouth daily as needed      COMBIGAN 0.2-0.5 % ophthalmic solution instill 1 DROP IN EACH EYE TWICE DAILY      hydrocortisone (SCALACORT) 2 % LOTN lotion Apply to affected area twice daily 1 each 0    predniSONE (DELTASONE) 20 MG tablet Take 40 mg (2 tab) x 4 days, then 20 mg (1 tab) x 4 days, then 10 mg (1/2 tab) x 4 days 14 tablet 0    butalbital-APAP-caffeine (FIORICET) 50-300-40 MG CAPS per capsule Take 1 capsule by mouth every 4 hours as needed for Headaches 15 capsule 0    ondansetron (ZOFRAN) 4 MG tablet Take 1 tablet by mouth 3 times daily as needed  for Nausea or Vomiting 15 tablet 0    ibuprofen (ADVIL;MOTRIN) 600 MG tablet Take 1 tablet by mouth 4 times daily as needed for Pain 20 tablet 0    acetaminophen (TYLENOL) 500 MG tablet Take 1 tablet by mouth every 6 hours as needed for Pain 30 tablet 1    fluticasone (FLONASE) 50 MCG/ACT nasal spray 2 sprays by Each Nostril route daily 16 g 0    diazePAM (VALIUM) 2 MG tablet Take 5 mg by mouth every 8 hours as needed.      ketorolac (TORADOL) 10 MG tablet Take 10 mg by mouth every 6 hours as needed      Lidocaine 3.5 % PTCH Apply 1 patch topically      promethazine (PHENERGAN) 50 MG/ML injection by NOT APPLICABLE route         Social Determinants of Health:   Social Determinants of Health     Tobacco Use: Low Risk  (09/12/2023)    Patient History     Smoking Tobacco Use: Never     Smokeless Tobacco Use: Never     Passive Exposure: Never   Alcohol Use: Patient Declined (09/12/2023)    AUDIT-C     Frequency of Alcohol Consumption: Patient declined     Average Number of Drinks: Patient declined     Frequency of Binge Drinking: Patient declined   Physicist, medical Strain: Not on file   Food Insecurity: Not on file   Transportation Needs: Not on file   Physical Activity: Not on file    Stress: Not on file   Social Connections: Not on file   Intimate Partner Violence: Not on file   Depression: Not on file   Housing Stability: Not on file   Interpersonal Safety: Not At Risk (07/29/2023)    Interpersonal Safety Domain Source: IP Abuse Screening     Physical abuse: Denies     Verbal abuse: Denies     Emotional abuse: Denies     Financial abuse: Denies     Sexual abuse: Denies   Utilities: Not on file       PHYSICAL EXAM   Physical Exam  Vitals and nursing note reviewed.   Constitutional:       General: She is not in acute distress.     Appearance: She is well-developed. She is not ill-appearing or toxic-appearing.   Cardiovascular:      Rate and Rhythm: Normal rate.   Pulmonary:      Effort: Pulmonary effort is normal.      Comments: Few scattered wheezes  No focal findings  Paroxysm of coughing  Musculoskeletal:         General: Normal range of motion.   Skin:     General: Skin is warm.   Neurological:      General: No focal deficit present.      Mental Status: She is alert.           SCREENINGS                  LAB, EKG AND DIAGNOSTIC RESULTS   Labs:  No results found for this or any previous visit (from the past 12 hour(s)).    EKG: Not Applicable    Radiologic Studies:  Non-plain film images such as CT, Ultrasound and MRI are read by the radiologist. Plain radiographic images are visualized and preliminarily interpreted by the ED Physician with the following findings: Not Applicable.    Interpretation per the Radiologist below,  if available at the time of this note:  No orders to display        ED COURSE and DIFFERENTIAL DIAGNOSIS/MDM   8:26 AM Differential and Considerations: Viral syndrome, covid, flu, bronchitis, PNA, Viral uRI    Records Reviewed (source and summary of external notes): Prior medical records and Nursing notes    Vitals:    Vitals:    09/12/23 0701 09/12/23 0705 09/12/23 0735 09/12/23 0808   BP: (!) 123/94 (!) 144/75 (!) 144/87 (!) 145/85   Pulse: 69      Resp: 16      Temp:  97 F (36.1 C)      SpO2: 96% 97% 93% 98%   Weight: 83.5 kg (184 lb)      Height: 1.702 m (5\' 7" )           ED COURSE       SEPSIS Reassessment: Patient does NOT meet Sepsis criteria after ED workup    Clinical Management Tools:  Not Applicable    Patient was given the following medications:  Medications - No data to display    CONSULTS: See ED Course/MDM for further details.  None     Social Determinants affecting Diagnosis/Treatment: None    Smoking Cessation: Not Applicable    PROCEDURES   Unless otherwise noted above, none.  Procedures      CRITICAL CARE TIME   Patient does not meet Critical Care Time, 0 minutes    ED IMPRESSION   No diagnosis found.      DISPOSITION/PLAN   DISPOSITION              Discharge Note: The patient is stable for discharge home. The signs, symptoms, diagnosis, and discharge instructions have been discussed, understanding conveyed, and agreed upon. The patient is to follow up as recommended or return to ER should their symptoms worsen.      PATIENT REFERRED TO:  [facility] [place] [facility]  [address]  [place] [address]  [phone]    As needed, If symptoms worsen        DISCHARGE MEDICATIONS:     Medication List        START taking these medications      albuterol sulfate HFA 108 (90 Base) MCG/ACT inhaler  Commonly known as: Ventolin HFA  Inhale 2 puffs into the lungs 4 times daily as needed for Wheezing            ASK your doctor about these medications      acetaminophen 500 MG tablet  Commonly known as: TYLENOL  Take 1 tablet by mouth every 6 hours as needed for Pain     amLODIPine-olmesartan 10-40 MG per tablet  Commonly known as: AZOR     * butalbital-APAP-caffeine 50-300-40 MG Caps per capsule  Commonly known as: Fioricet  Take 1 capsule by mouth every 4 hours as needed for Headaches     * butalbital-acetaminophen-caffeine 50-325-40 MG per tablet  Commonly known as: FIORICET, ESGIC  Take 1 tablet by mouth every 4 hours as needed for Headaches      Combigan 0.2-0.5 % ophthalmic solution  Generic drug: brimonidine-timolol     diazePAM 2 MG tablet  Commonly known as: VALIUM     fluticasone 50 MCG/ACT nasal spray  Commonly known as: FLONASE  2 sprays by Each Nostril route daily     HYDROcodone-chlorpheniramine 10-8 MG/5ML Suer  Commonly known as: TUSSIONEX  Take 5 mLs by mouth nightly as needed (  cough) for up to 5 days. Max Daily Amount: 5 mLs  Ask about: Should I take this medication?     hydrocortisone 2 % Lotn lotion  Commonly known as: SCALACORT  Apply to affected area twice daily     ibuprofen 600 MG tablet  Commonly known as: ADVIL;MOTRIN  Take 1 tablet by mouth 4 times daily as needed for Pain     ketorolac 10 MG tablet  Commonly known as: TORADOL     Lidocaine 3.5 % Ptch     ondansetron 4 MG tablet  Commonly known as: ZOFRAN  Take 1 tablet by mouth 3 times daily as needed for Nausea or Vomiting     predniSONE 20 MG tablet  Commonly known as: DELTASONE  Take 40 mg (2 tab) x 4 days, then 20 mg (1 tab) x 4 days, then 10 mg (1/2 tab) x 4 days     promethazine 50 MG/ML injection  Commonly known as: PHENERGAN     tiZANidine 2 MG tablet  Commonly known as: ZANAFLEX           * This list has 2 medication(s) that are the same as other medications prescribed for you. Read the directions carefully, and ask your doctor or other care provider to review them with you.                   Where to Get Your Medications        These medications were sent to [facility] [place], [place] - [address] - [place] [phone] [place]on Ginsberg [phone]  [address] [place] [address]      Phone: [phone]   albuterol sulfate HFA 108 (90 Base) MCG/ACT inhaler  HYDROcodone-chlorpheniramine 10-8 MG/5ML Suer           DISCONTINUED MEDICATIONS:  Current Discharge Medication List          I am the Primary Clinician of Record: Marquette Old, MD (electronically signed)    (Please note that parts of this dictation were completed with voice recognition  software. Quite often unanticipated grammatical, syntax, homophones, and other interpretive errors are inadvertently transcribed by the computer software. Please disregards these errors. Please excuse any errors that have escaped final proofreading.)     Marquette Old, MD  09/18/23 2353

## 2023-09-12 NOTE — Discharge Instructions (Addendum)
.      Thank you for choosing our Emergency Department for your care.  It is our privilege to care for you in your time of need.  In the next several days, you may receive a survey via email or mailed to your home about your experience with our team.  We would greatly appreciate you taking a few minutes to complete the survey, as we use this information to learn what we have done well and what we could be doing better. Thank you for trusting [place] with your care!    Below you will find a list of your tests from today's visit.   Labs and Radiology Studies  No results found for this or any previous visit (from the past 12 hour(s)).  No results found.  ------------------------------------------------------------------------------------------------------------  The evaluation and treatment you received in the Emergency Department were for an urgent problem. It is important that you follow-up with a doctor, nurse practitioner, or physician assistant to:  (1) confirm your diagnosis,  (2) re-evaluation of changes in your illness and treatment, and (3) for ongoing care. Please take your discharge instructions with you when you go to your follow-up appointment.     If you have any problem arranging a follow-up appointment, contact us!  If your symptoms become worse or you do not improve as expected, please return to [place]. We are available 24 hours a day.     If a prescription has been provided, please fill it as soon as possible to prevent a delay in treatment. If you have any questions or reservations about taking the medication due to side effects or interactions with other medications, please call your primary care provider or contact us directly.  Again, THANK YOU for choosing [place] to care for YOU!

## 2023-11-07 ENCOUNTER — Emergency Department: Admit: 2023-11-08 | Payer: MEDICAID

## 2023-11-07 ENCOUNTER — Inpatient Hospital Stay
Admit: 2023-11-07 | Discharge: 2023-11-08 | Disposition: A | Discharge status: Home / Self Care | Payer: MEDICAID | Attending: Emergency Medicine

## 2023-11-07 DIAGNOSIS — K529 Noninfective gastroenteritis and colitis, unspecified: Secondary | ICD-10-CM

## 2023-11-07 LAB — CBC WITH AUTO DIFFERENTIAL
Basophils %: 0.1 % (ref 0.0–1.0)
Basophils Absolute: 0.01 10*3/uL (ref 0.00–0.10)
Eosinophils %: 0.1 % (ref 0.0–7.0)
Eosinophils Absolute: 0.01 10*3/uL (ref 0.00–0.40)
Hematocrit: 34.6 % — ABNORMAL LOW (ref 35.0–47.0)
Hemoglobin: 10.9 g/dL — ABNORMAL LOW (ref 11.5–16.0)
Immature Granulocytes %: 0.3 % (ref 0–0.5)
Immature Granulocytes Absolute: 0.02 10*3/uL (ref 0.00–0.04)
Lymphocytes %: 12.5 % (ref 12.0–49.0)
Lymphocytes Absolute: 0.84 10*3/uL (ref 0.80–3.50)
MCH: 19.8 pg — ABNORMAL LOW (ref 26.0–34.0)
MCHC: 31.5 g/dL (ref 30.0–36.5)
MCV: 62.8 FL — ABNORMAL LOW (ref 80.0–99.0)
MPV: 9.5 FL (ref 8.9–12.9)
Monocytes %: 6.8 % (ref 5.0–13.0)
Monocytes Absolute: 0.46 10*3/uL (ref 0.00–1.00)
Neutrophils %: 80.2 % — ABNORMAL HIGH (ref 32.0–75.0)
Neutrophils Absolute: 5.39 10*3/uL (ref 1.80–8.00)
Nucleated RBCs: 0 /100{WBCs}
Platelets: 244 10*3/uL (ref 150–400)
RBC: 5.51 M/uL — ABNORMAL HIGH (ref 3.80–5.20)
RDW: 16 % — ABNORMAL HIGH (ref 11.5–14.5)
WBC: 6.7 10*3/uL (ref 3.6–11.0)
nRBC: 0 10*3/uL (ref 0.00–0.01)

## 2023-11-07 LAB — COMPREHENSIVE METABOLIC PANEL
ALT: 28 U/L (ref 12–78)
AST: 14 U/L — ABNORMAL LOW (ref 15–37)
Albumin/Globulin Ratio: 1 — ABNORMAL LOW (ref 1.1–2.2)
Albumin: 3.5 g/dL (ref 3.5–5.0)
Alk Phosphatase: 79 U/L (ref 45–117)
Anion Gap: 10 mmol/L (ref 2–12)
BUN/Creatinine Ratio: 13 (ref 12–20)
BUN: 11 mg/dL (ref 6–20)
CO2: 26 mmol/L (ref 21–32)
Calcium: 8.7 mg/dL (ref 8.5–10.1)
Chloride: 107 mmol/L (ref 97–108)
Creatinine: 0.85 mg/dL (ref 0.55–1.02)
Est, Glom Filt Rate: 76 mL/min/{1.73_m2} (ref >60)
Globulin: 3.6 g/dL (ref 2.0–4.0)
Glucose: 111 mg/dL — ABNORMAL HIGH (ref 65–100)
Potassium: 3.6 mmol/L (ref 3.5–5.1)
Sodium: 143 mmol/L (ref 136–145)
Total Bilirubin: 0.6 mg/dL (ref 0.2–1.0)
Total Protein: 7.1 g/dL (ref 6.4–8.2)

## 2023-11-07 LAB — URINALYSIS
Bilirubin, Urine: NEGATIVE
Glucose, Ur: NEGATIVE mg/dL
Leukocyte Esterase, Urine: NEGATIVE
Nitrite, Urine: NEGATIVE
Protein, UA: 30 mg/dL — AB
Specific Gravity, UA: >1.03 — ABNORMAL HIGH (ref 1.003–1.030)
Urobilinogen, Urine: 0.2 EU/dL (ref 0.2–1.0)
pH, Urine: 6 (ref 5.0–8.0)

## 2023-11-07 LAB — URINALYSIS, MICRO

## 2023-11-07 MED ORDER — ONDANSETRON 4 MG PO TBDP
4 | Freq: Three times a day (TID) | ORAL | Status: DC | PRN
Start: 2023-11-07 — End: 2023-11-07
  Administered 2023-11-07: 20:00:00 4 mg via ORAL

## 2023-11-07 MED FILL — ONDANSETRON 4 MG PO TBDP: 4 MG | ORAL | Qty: 1 | Fill #0

## 2023-11-07 NOTE — ED Triage Notes (Signed)
 Report to [facility]

## 2023-11-07 NOTE — ED Provider Notes (Signed)
 [facility] EM[facility]Y DEPT  EMERGENCY DEPARTMENT HISTORY AND PHYSICAL EXAM      Date: 11/07/2023  Patient Name: Laurie Miller  MRN: [id number]  Birthdate: 01-05-1961  Date of evaluation: 11/07/2023  Provider: Domingo Dimes, MD   Note Started: 8:08 PM EDT 11/07/23    HISTORY OF PRESENT ILLNESS     Chief Complaint   Patient presents with    Vomiting       History Provided By: Patient    HPI: Laurie Miller is a 63 y.o. female     PAST MEDICAL HISTORY   Past Medical History:  Past Medical History:   Diagnosis Date    Bursitis     CKD (chronic kidney disease)     Fatty tumor     Hypertension     Migraine        Past Surgical History:  Past Surgical History:   Procedure Laterality Date    ORTHOPEDIC SURGERY         Family History:  No family history on file.    Social History:  Social History     Tobacco Use    Smoking status: Never     Passive exposure: Never    Smokeless tobacco: Never   Vaping Use    Vaping status: Never Used   Substance Use Topics    Alcohol use: Not Currently    Drug use: Never       Allergies:  Allergies   Allergen Reactions    Aspirin Nausea Only    Celecoxib Nausea Only    Gabapentin     Hydrocodone-Acetaminophen Nausea Only    Ibuprofen Nausea Only    Ketorolac Nausea Only    Meperidine Itching    Naproxen Nausea Only    Nsaids     Oxycodone-Acetaminophen Itching    Tramadol Nausea Only    Ubrogepant     Clarithromycin Rash    Penicillins Angioedema and Rash     Hemorrhage    hemorrhage    Sulfa Antibiotics Hives and Rash     Other Reaction(s): Itchy, rash       PCP: No primary care provider on file.    Current Meds:   Current Facility-Administered Medications   Medication Dose Route Frequency Provider Last Rate Last Admin    ondansetron (ZOFRAN-ODT) disintegrating tablet 4 mg  4 mg Oral Q8H PRN Benn-Thompson, Ulyses Panico, MD   4 mg at 11/07/23 1538    iopamidol (ISOVUE-370) 76 % injection 100 mL  100 mL IntraVENous ONCE PRN Benn-Thompson, Siren Porrata, MD         Current Outpatient Medications   Medication Sig  Dispense Refill    albuterol sulfate HFA (VENTOLIN HFA) 108 (90 Base) MCG/ACT inhaler Inhale 2 puffs into the lungs 4 times daily as needed for Wheezing 54 g 1    butalbital-acetaminophen-caffeine (FIORICET, ESGIC) 50-325-40 MG per tablet Take 1 tablet by mouth every 4 hours as needed for Headaches 12 tablet 0    amLODIPine-olmesartan (AZOR) 10-40 MG per tablet Take 1 tablet by mouth daily      tiZANidine (ZANAFLEX) 2 MG tablet Take 1 tablet by mouth daily as needed      COMBIGAN 0.2-0.5 % ophthalmic solution instill 1 DROP IN EACH EYE TWICE DAILY      hydrocortisone (SCALACORT) 2 % LOTN lotion Apply to affected area twice daily 1 each 0    predniSONE (DELTASONE) 20 MG tablet Take 40 mg (2 tab) x 4 days, then 20 mg (1 tab) x 4  days, then 10 mg (1/2 tab) x 4 days 14 tablet 0    butalbital-APAP-caffeine (FIORICET) 50-300-40 MG CAPS per capsule Take 1 capsule by mouth every 4 hours as needed for Headaches 15 capsule 0    ondansetron (ZOFRAN) 4 MG tablet Take 1 tablet by mouth 3 times daily as needed for Nausea or Vomiting 15 tablet 0    ibuprofen (ADVIL;MOTRIN) 600 MG tablet Take 1 tablet by mouth 4 times daily as needed for Pain 20 tablet 0    acetaminophen (TYLENOL) 500 MG tablet Take 1 tablet by mouth every 6 hours as needed for Pain 30 tablet 1    fluticasone (FLONASE) 50 MCG/ACT nasal spray 2 sprays by Each Nostril route daily 16 g 0    diazePAM (VALIUM) 2 MG tablet Take 5 mg by mouth every 8 hours as needed.      ketorolac (TORADOL) 10 MG tablet Take 10 mg by mouth every 6 hours as needed      Lidocaine 3.5 % PTCH Apply 1 patch topically      promethazine (PHENERGAN) 50 MG/ML injection by NOT APPLICABLE route         Social Determinants of Health:   Social Drivers of Health     Tobacco Use: Low Risk  (10/27/2023)    Received from [facility]    Patient History     Smoking Tobacco Use: Never     Smokeless Tobacco Use: Never     Passive Exposure: Not on file   Alcohol Use: Patient Declined (09/12/2023)    AUDIT-C      Frequency of Alcohol Consumption: Patient declined     Average Number of Drinks: Patient declined     Frequency of Binge Drinking: Patient declined   Physicist, medical Strain: Not on file   Food Insecurity: Not on file   Transportation Needs: Not on file   Physical Activity: Not on file   Stress: Not on file   Social Connections: Not on file   Intimate Partner Violence: Not on file   Depression: Not on file   Housing Stability: Not on file   Interpersonal Safety: Not At Risk (07/29/2023)    Interpersonal Safety Domain Source: IP Abuse Screening     Physical abuse: Denies     Verbal abuse: Denies     Emotional abuse: Denies     Financial abuse: Denies     Sexual abuse: Denies   Utilities: Not on file       PHYSICAL EXAM   Physical Exam  Vitals and nursing note reviewed.   Constitutional:       General: She is not in acute distress.     Appearance: Normal appearance. She is normal weight. She is not ill-appearing, toxic-appearing or diaphoretic.   HENT:      Head: Normocephalic and atraumatic.      Nose: Nose normal.      Mouth/Throat:      Mouth: Mucous membranes are moist.      Pharynx: No oropharyngeal exudate.   Eyes:      Extraocular Movements: Extraocular movements intact.      Conjunctiva/sclera: Conjunctivae normal.      Pupils: Pupils are equal, round, and reactive to light.   Cardiovascular:      Rate and Rhythm: Normal rate and regular rhythm.      Pulses: Normal pulses.      Heart sounds: Normal heart sounds.   Pulmonary:      Effort: Pulmonary effort is  normal.      Breath sounds: Normal breath sounds.   Abdominal:      General: Bowel sounds are normal.      Palpations: Abdomen is soft.      Tenderness: There is no abdominal tenderness.   Musculoskeletal:         General: Normal range of motion.      Cervical back: Normal range of motion and neck supple.   Skin:     General: Skin is warm and dry.      Capillary Refill: Capillary refill takes less than 2 seconds.   Neurological:      General: No focal  deficit present.      Mental Status: She is alert and oriented to person, place, and time.   Psychiatric:         Mood and Affect: Mood normal.         Behavior: Behavior normal.           SCREENINGS              LAB, EKG AND DIAGNOSTIC RESULTS   Labs:  Recent Results (from the past 12 hours)   CBC with Auto Differential    Collection Time: 11/07/23  6:12 PM   Result Value Ref Range    WBC 6.7 3.6 - 11.0 K/uL    RBC 5.51 (H) 3.80 - 5.20 M/uL    Hemoglobin 10.9 (L) 11.5 - 16.0 g/dL    Hematocrit 95.6 (L) 35.0 - 47.0 %    MCV 62.8 (L) 80.0 - 99.0 FL    MCH 19.8 (L) 26.0 - 34.0 PG    MCHC 31.5 30.0 - 36.5 g/dL    RDW 21.3 (H) 08.6 - 14.5 %    Platelets 244 150 - 400 K/uL    MPV 9.5 8.9 - 12.9 FL    Nucleated RBCs 0.0 0.0 PER 100 WBC    nRBC 0.00 0.00 - 0.01 K/uL    Neutrophils % 80.2 (H) 32.0 - 75.0 %    Lymphocytes % 12.5 12.0 - 49.0 %    Monocytes % 6.8 5.0 - 13.0 %    Eosinophils % 0.1 0.0 - 7.0 %    Basophils % 0.1 0.0 - 1.0 %    Immature Granulocytes % 0.3 0 - 0.5 %    Neutrophils Absolute 5.39 1.80 - 8.00 K/UL    Lymphocytes Absolute 0.84 0.80 - 3.50 K/UL    Monocytes Absolute 0.46 0.00 - 1.00 K/UL    Eosinophils Absolute 0.01 0.00 - 0.40 K/UL    Basophils Absolute 0.01 0.00 - 0.10 K/UL    Immature Granulocytes Absolute 0.02 0.00 - 0.04 K/UL    Differential Type AUTOMATED     Comprehensive Metabolic Panel    Collection Time: 11/07/23  6:12 PM   Result Value Ref Range    Sodium 143 136 - 145 mmol/L    Potassium 3.6 3.5 - 5.1 mmol/L    Chloride 107 97 - 108 mmol/L    CO2 26 21 - 32 mmol/L    Anion Gap 10 2 - 12 mmol/L    Glucose 111 (H) 65 - 100 mg/dL    BUN 11 6 - 20 mg/dL    Creatinine 5.78 4.69 - 1.02 mg/dL    BUN/Creatinine Ratio 13 12 - 20      Est, Glom Filt Rate 76 >60 ml/min/1.43m2    Calcium 8.7 8.5 - 10.1 mg/dL    Total Bilirubin 0.6 0.2 - 1.0 mg/dL  AST 14 (L) 15 - 37 U/L    ALT 28 12 - 78 U/L    Alk Phosphatase 79 45 - 117 U/L    Total Protein 7.1 6.4 - 8.2 g/dL    Albumin 3.5 3.5 - 5.0 g/dL     Globulin 3.6 2.0 - 4.0 g/dL    Albumin/Globulin Ratio 1.0 (L) 1.1 - 2.2     Urinalysis    Collection Time: 11/07/23  7:27 PM   Result Value Ref Range    Color, UA Yellow/Straw      Appearance Clear Clear      Specific Gravity, UA >1.030 (H) 1.003 - 1.030    pH, Urine 6.0 5.0 - 8.0      Protein, UA 30 (A) Negative mg/dL    Glucose, Ur Negative Negative mg/dL    Ketones, Urine Trace (A) Negative mg/dL    Bilirubin, Urine Negative Negative      Blood, Urine Trace (A) Negative      Urobilinogen, Urine 0.2 0.2 - 1.0 EU/dL    Nitrite, Urine Negative Negative      Leukocyte Esterase, Urine Negative Negative     Urinalysis, Micro    Collection Time: 11/07/23  7:27 PM   Result Value Ref Range    WBC, UA 0-4 0 - 5 /hpf    RBC, UA 0-5 0 - 3 /hpf    BACTERIA, URINE 1+ (A) Negative /hpf       EKG: Not Applicable    Radiologic Studies:  Non-plain film images such as CT, Ultrasound and MRI are read by the radiologist. Plain radiographic images are visualized and preliminarily interpreted by the ED Physician with the following findings: CT Interpreted by me.  Shows CT abdomen pelvis negative for any acute intra or abdominal process    Interpretation per the Radiologist below, if available at the time of this note:  CT ABDOMEN PELVIS W IV CONTRAST Additional Contrast? None    (Results Pending)        ED COURSE and DIFFERENTIAL DIAGNOSIS/MDM   CC/HPI Summary, DDx, ED Course, and Reassessment: 63 year old female presents with a complaint of multiple episodes of nausea vomiting that started yesterday    Records Reviewed (source and summary of external notes): Prior medical records and Nursing notes    Vitals:    Vitals:    11/07/23 1536   BP: 118/85   Pulse: 97   Resp: 18   Temp: 98.1 F (36.7 C)   SpO2: 98%   Weight: 83.5 kg (184 lb)   Height: 1.702 m (5\' 7" )        ED COURSE    Labs  CT abdomen pelvis      Rule out bowel obstruction versus bacterial/inflammatory intra-abdominal process       Disposition Considerations (Tests not  done, Shared Decision Making, Pt Expectation of Test or Treatment.): 63 year old female presenting for vomiting diarrhea, no acute findings on physical exam, no acute metabolic derangement no acute CBC changes, radiological studies negative for any acute intra-abdominal process, patient stabilized for discharge    Patient was given the following medications:  Medications   ondansetron (ZOFRAN-ODT) disintegrating tablet 4 mg (4 mg Oral Given 11/07/23 1538)   iopamidol (ISOVUE-370) 76 % injection 100 mL (has no administration in time range)       CONSULTS: (Who and What was discussed)  None     Social Determinants affecting Dx or Tx: None    Smoking Cessation: Not Applicable    PROCEDURES  Unless otherwise noted above, none.  Procedures      CRITICAL CARE TIME   Patient does not meet Critical Care Time, 0 minutes    FINAL IMPRESSION   No diagnosis found.      DISPOSITION/PLAN   DISPOSITION               Discharge Note: The patient is stable for discharge home. The signs, symptoms, diagnosis, and discharge instructions have been discussed, understanding conveyed, and agreed upon. The patient is to follow up as recommended or return to ER should their symptoms worsen.      PATIENT REFERRED TO:  No follow-up provider specified.      DISCHARGE MEDICATIONS:     Medication List        ASK your doctor about these medications      acetaminophen 500 MG tablet  Commonly known as: TYLENOL  Take 1 tablet by mouth every 6 hours as needed for Pain     albuterol sulfate HFA 108 (90 Base) MCG/ACT inhaler  Commonly known as: Ventolin HFA  Inhale 2 puffs into the lungs 4 times daily as needed for Wheezing     amLODIPine-olmesartan 10-40 MG per tablet  Commonly known as: AZOR     * butalbital-APAP-caffeine 50-300-40 MG Caps per capsule  Commonly known as: Fioricet  Take 1 capsule by mouth every 4 hours as needed for Headaches     * butalbital-acetaminophen-caffeine 50-325-40 MG per tablet  Commonly known as: FIORICET, ESGIC  Take 1  tablet by mouth every 4 hours as needed for Headaches     Combigan 0.2-0.5 % ophthalmic solution  Generic drug: brimonidine-timolol     diazePAM 2 MG tablet  Commonly known as: VALIUM     fluticasone 50 MCG/ACT nasal spray  Commonly known as: FLONASE  2 sprays by Each Nostril route daily     hydrocortisone 2 % Lotn lotion  Commonly known as: SCALACORT  Apply to affected area twice daily     ibuprofen 600 MG tablet  Commonly known as: ADVIL;MOTRIN  Take 1 tablet by mouth 4 times daily as needed for Pain     ketorolac 10 MG tablet  Commonly known as: TORADOL     Lidocaine 3.5 % Ptch     ondansetron 4 MG tablet  Commonly known as: ZOFRAN  Take 1 tablet by mouth 3 times daily as needed for Nausea or Vomiting     predniSONE 20 MG tablet  Commonly known as: DELTASONE  Take 40 mg (2 tab) x 4 days, then 20 mg (1 tab) x 4 days, then 10 mg (1/2 tab) x 4 days     promethazine 50 MG/ML injection  Commonly known as: PHENERGAN     tiZANidine 2 MG tablet  Commonly known as: ZANAFLEX           * This list has 2 medication(s) that are the same as other medications prescribed for you. Read the directions carefully, and ask your doctor or other care provider to review them with you.                    DISCONTINUED MEDICATIONS:  Current Discharge Medication List          I am the Primary Clinician of Record: Domingo Dimes, MD (electronically signed)    (Please note that parts of this dictation were completed with voice recognition software. Quite often unanticipated grammatical, syntax, homophones, and other interpretive errors are inadvertently transcribed by the computer  software. Please disregards these errors. Please excuse any errors that have escaped final proofreading.)     Domingo Dimes, MD  11/10/23 2008

## 2023-11-07 NOTE — ED Notes (Signed)
 Pt reports N/V/D without pain that began yesterday.

## 2023-11-07 NOTE — ED Notes (Signed)
 Pt asking ED techf or pain med- nurse notified- pt observed playing on phone. Advised her at this time MD has written no pain meds for her to take

## 2023-11-08 MED ORDER — IOPAMIDOL 76 % IV SOLN
76 | Freq: Once | INTRAVENOUS | Status: AC | PRN
Start: 2023-11-08 — End: 2023-11-07
  Administered 2023-11-08: 100 mL via INTRAVENOUS

## 2023-11-08 MED ORDER — ONDANSETRON HCL 4 MG PO TABS
4 | ORAL_TABLET | Freq: Three times a day (TID) | ORAL | 0 refills | Status: AC | PRN
Start: 2023-11-08 — End: ?

## 2023-11-08 MED FILL — ISOVUE-370 76 % IV SOLN: 76 % | INTRAVENOUS | Qty: 100 | Fill #0

## 2023-12-04 ENCOUNTER — Emergency Department: Payer: Medicaid (Managed Care)

## 2023-12-04 ENCOUNTER — Inpatient Hospital Stay
Admit: 2023-12-04 | Discharge: 2023-12-04 | Disposition: A | Discharge status: Home / Self Care | Payer: Medicaid (Managed Care) | Attending: Emergency Medicine

## 2023-12-04 ENCOUNTER — Emergency Department: Admit: 2023-12-04 | Payer: Medicaid (Managed Care)

## 2023-12-04 DIAGNOSIS — S9032XA Contusion of left foot, initial encounter: Secondary | ICD-10-CM

## 2023-12-04 MED ORDER — LIDOCAINE 5 % EX PTCH
5 | MEDICATED_PATCH | Freq: Every day | CUTANEOUS | 0 refills | Status: AC
Start: 2023-12-04 — End: 2023-12-14

## 2023-12-04 NOTE — ED Provider Notes (Signed)
 [facility] EMERGENCY DEPT  EMERGENCY DEPARTMENT HISTORY AND PHYSICAL EXAM      Date of evaluation: 12/04/2023  Patient Name: Laurie Miller  Birthdate 09-04-1960  MRN: [id number]  ED Provider: Joaquin Mulberry, MD   Note Started: 8:39 AM EDT 12/04/23    HISTORY OF PRESENT ILLNESS     Chief Complaint   Patient presents with    Foot Injury       History Provided By: Patient, only     HPI: Laurie Miller is a 63 y.o. female says she twisted her left ankle yesterday while wrangling her dog.  She says she is able to walk on it.  She says she has a sharp pain in the left foot is worse with walking and today has bruising.  She is concerned she may have broken it.    PAST MEDICAL HISTORY   Past Medical History:  Past Medical History:   Diagnosis Date    Bursitis     CKD (chronic kidney disease)     Fatty tumor     Hypertension     Migraine        Past Surgical History:  Past Surgical History:   Procedure Laterality Date    ORTHOPEDIC SURGERY         Family History:  History reviewed. No pertinent family history.    Social History:  Social History     Tobacco Use    Smoking status: Never     Passive exposure: Never    Smokeless tobacco: Never   Vaping Use    Vaping status: Never Used   Substance Use Topics    Alcohol use: Not Currently    Drug use: Never       Allergies:  Allergies   Allergen Reactions    Aspirin Nausea Only    Celecoxib Nausea Only    Gabapentin     Hydrocodone-Acetaminophen  Nausea Only    Ibuprofen  Nausea Only    Ketorolac  Nausea Only    Meperidine Itching    Naproxen Nausea Only    Nsaids     Oxycodone -Acetaminophen  Itching    Tramadol  Nausea Only    Ubrogepant     Clarithromycin Rash    Penicillins Angioedema and Rash     Hemorrhage    hemorrhage    Sulfa Antibiotics Hives and Rash     Other Reaction(s): Itchy, rash       PCP: No primary care provider on file.    Current Meds:   No current facility-administered medications for this encounter.     Current Outpatient Medications   Medication Sig Dispense Refill    lidocaine  (LIDODERM) 5 % Place 1 patch onto the skin daily for 10 days 12 hours on, 12 hours off. 10 patch 0    ondansetron  (ZOFRAN ) 4 MG tablet Take 1 tablet by mouth 3 times daily as needed for Nausea or Vomiting 15 tablet 0    albuterol  sulfate HFA (VENTOLIN  HFA) 108 (90 Base) MCG/ACT inhaler Inhale 2 puffs into the lungs 4 times daily as needed for Wheezing 54 g 1    butalbital -acetaminophen -caffeine  (FIORICET , ESGIC ) 50-325-40 MG per tablet Take 1 tablet by mouth every 4 hours as needed for Headaches 12 tablet 0    amLODIPine-olmesartan (AZOR) 10-40 MG per tablet Take 1 tablet by mouth daily      tiZANidine (ZANAFLEX) 2 MG tablet Take 1 tablet by mouth daily as needed      COMBIGAN 0.2-0.5 % ophthalmic solution instill 1 DROP IN  EACH EYE TWICE DAILY      hydrocortisone  (SCALACORT) 2 % LOTN lotion Apply to affected area twice daily 1 each 0    predniSONE  (DELTASONE ) 20 MG tablet Take 40 mg (2 tab) x 4 days, then 20 mg (1 tab) x 4 days, then 10 mg (1/2 tab) x 4 days 14 tablet 0    butalbital -APAP-caffeine  (FIORICET ) 50-300-40 MG CAPS per capsule Take 1 capsule by mouth every 4 hours as needed for Headaches 15 capsule 0    ibuprofen  (ADVIL ;MOTRIN ) 600 MG tablet Take 1 tablet by mouth 4 times daily as needed for Pain 20 tablet 0    acetaminophen  (TYLENOL ) 500 MG tablet Take 1 tablet by mouth every 6 hours as needed for Pain 30 tablet 1    fluticasone  (FLONASE ) 50 MCG/ACT nasal spray 2 sprays by Each Nostril route daily 16 g 0    diazePAM (VALIUM) 2 MG tablet Take 5 mg by mouth every 8 hours as needed.      ketorolac  (TORADOL ) 10 MG tablet Take 10 mg by mouth every 6 hours as needed      promethazine (PHENERGAN) 50 MG/ML injection by NOT APPLICABLE route         Social Determinants of Health:   Social Drivers of Health     Tobacco Use: Low Risk  (12/04/2023)    Patient History     Smoking Tobacco Use: Never     Smokeless Tobacco Use: Never     Passive Exposure: Never   Alcohol Use: Patient Declined (12/04/2023)    AUDIT-C      Frequency of Alcohol Consumption: Patient declined     Average Number of Drinks: Patient declined     Frequency of Binge Drinking: Patient declined   Financial Resource Strain: High Risk (11/24/2023)    Received from [facility]    Overall Financial Resource Strain (CARDIA)     Difficulty of Paying Living Expenses: Very hard   Food Insecurity: Food Insecurity Present (11/24/2023)    Received from [facility]    Hunger Vital Sign     Worried About Running Out of Food in the Last Year: Often true     Ran Out of Food in the Last Year: Often true   Transportation Needs: No Transportation Needs (11/24/2023)    Received from [facility]    PRAPARE - Transportation     Lack of Transportation (Medical): No     Lack of Transportation (Non-Medical): No   Physical Activity: Not on file   Stress: Not on file   Social Connections: Not on file   Intimate Partner Violence: Not on file   Depression: Not on file   Housing Stability: High Risk (11/24/2023)    Received from [facility]    Housing     Within the past 12 months, have you ever stayed: outside, in a car, in a tent, in an overnight shelter, or temporarily in someone else's home(i.e.couch-surfing)?: Yes     Are you worried about losing your housing?: Yes   Interpersonal Safety: Not At Risk (07/29/2023)    Interpersonal Safety Domain Source: IP Abuse Screening     Physical abuse: Denies     Verbal abuse: Denies     Emotional abuse: Denies     Financial abuse: Denies     Sexual abuse: Denies   Utilities: High Risk (11/24/2023)    Received from [facility]    Utilities     Within the past  12 months, have you been unable to get utilities(heat, electricity) when it was really needed?: Yes     PHYSICAL EXAM   Physical Exam  Constitutional:       Appearance: Normal appearance.   HENT:      Head: Normocephalic and atraumatic.      Right Ear: External ear normal.      Left Ear: External ear normal.      Nose: Nose normal.      Mouth/Throat:      Mouth: Mucous  membranes are moist.   Eyes:      Extraocular Movements: Extraocular movements intact.      Conjunctiva/sclera: Conjunctivae normal.      Pupils: Pupils are equal, round, and reactive to light.   Cardiovascular:      Rate and Rhythm: Normal rate and regular rhythm.      Pulses: Normal pulses.      Heart sounds: Normal heart sounds.   Pulmonary:      Effort: Pulmonary effort is normal.      Breath sounds: Normal breath sounds.   Abdominal:      General: Abdomen is flat. Bowel sounds are normal.      Palpations: Abdomen is soft.   Musculoskeletal:         General: Swelling, tenderness and signs of injury present. No deformity. Normal range of motion.      Cervical back: Normal range of motion and neck supple.   Skin:     General: Skin is warm and dry.      Capillary Refill: Capillary refill takes less than 2 seconds.   Neurological:      General: No focal deficit present.      Mental Status: She is alert and oriented to person, place, and time.   Psychiatric:         Mood and Affect: Mood normal.         Behavior: Behavior normal.         Thought Content: Thought content normal.         Judgment: Judgment normal.       SCREENINGS                No data recorded       LAB, EKG AND DIAGNOSTIC RESULTS   Labs:  No results found for this or any previous visit (from the past 12 hours).    EKG:.Not Applicable     Radiologic Studies:  Radiographic images are visualized and preliminarily interpreted by the ED Provider with the following findings: Xray Interpreted by me.  Shows no appreciable fracture .     Interpretation per the Radiologist below, if available at the time of this note:  XR FOOT LEFT (MIN 3 VIEWS)   Final Result   1. No fracture.   2. Mild hallux valgus and osteoarthritis.      Electronically signed by Berniece Brisk           Records Reviewed: Not Applicable    MEDICAL DECISION MAKING and ED COURSE   10:03 AM Differential and Considerations of tests not ordered: Will obtain x-ray of the left foot.  DP and PT  pulses intact and some vascular studies are not needed at this time     Vitals:    Vitals:    12/04/23 0839   BP: (!) 146/100   Pulse: 80   Resp: 18   Temp: 97.2 F (36.2 C)   SpO2: 100%   Weight: 83.5  kg (184 lb)   Height: 1.702 m (5\' 7" )       ED COURSE  ED Course as of 12/04/23 1003   Sun Dec 04, 2023   0945 Patient's lactate is minimally elevated likely secondary to nausea and vomiting.will hydrate.  There is no fever or evidence of infection otherwise. [CS]   1003 No evidence of acute fracture the patient does not want crutches.  She says she is able to bear weight.  Will prescribe Lidoderm patches as an outpatient [CS]      ED Course User Index  [CS] Margot Sheng, MD       Clinical Management Tools:  Not Applicable    Smoking Cessation: Not Applicable    Patient was given the following medications:  Medications - No data to display    CONSULTS: See ED Course/MDM for further details.  None   PROCEDURES   Unless otherwise noted above, none  Procedures    SEPSIS REASSESSMENT & CRITICAL CARE TIME   SEPSIS REASSESSMENT: Patient does NOT meet Sepsis criteria after ED workup    Patient does not meet Critical Care Time, 0 minutes  CLINICAL IMPRESSIONS     1. Contusion of left foot, initial encounter       SDOH/DISPOSITION/PLAN   Social Determinants affecting Treatment Plan: None    DISPOSITION Decision To Discharge 12/04/2023 10:02:30 AM   DISPOSITION CONDITION Stable         Discharge Note: The patient is stable for discharge home. The signs, symptoms, diagnosis, and discharge instructions have been discussed, understanding conveyed, and agreed upon. The patient is to follow up as recommended or return to ER should their symptoms worsen.      PATIENT REFERRED TO:  No follow-up provider specified.      DISCHARGE MEDICATIONS:     Medication List        START taking these medications      lidocaine 5 %  Commonly known as: LIDODERM  Place 1 patch onto the skin daily for 10 days 12 hours on, 12 hours off.             ASK your doctor about these medications      acetaminophen  500 MG tablet  Commonly known as: TYLENOL   Take 1 tablet by mouth every 6 hours as needed for Pain     albuterol  sulfate HFA 108 (90 Base) MCG/ACT inhaler  Commonly known as: Ventolin  HFA  Inhale 2 puffs into the lungs 4 times daily as needed for Wheezing     amLODIPine-olmesartan 10-40 MG per tablet  Commonly known as: AZOR     * butalbital -APAP-caffeine  50-300-40 MG Caps per capsule  Commonly known as: Fioricet   Take 1 capsule by mouth every 4 hours as needed for Headaches     * butalbital -acetaminophen -caffeine  50-325-40 MG per tablet  Commonly known as: FIORICET , ESGIC   Take 1 tablet by mouth every 4 hours as needed for Headaches     Combigan 0.2-0.5 % ophthalmic solution  Generic drug: brimonidine-timolol     diazePAM 2 MG tablet  Commonly known as: VALIUM     fluticasone  50 MCG/ACT nasal spray  Commonly known as: FLONASE   2 sprays by Each Nostril route daily     hydrocortisone  2 % Lotn lotion  Commonly known as: SCALACORT  Apply to affected area twice daily     ibuprofen  600 MG tablet  Commonly known as: ADVIL ;MOTRIN   Take 1 tablet by mouth 4 times daily as needed for Pain  ketorolac  10 MG tablet  Commonly known as: TORADOL      ondansetron  4 MG tablet  Commonly known as: ZOFRAN   Take 1 tablet by mouth 3 times daily as needed for Nausea or Vomiting     predniSONE  20 MG tablet  Commonly known as: DELTASONE   Take 40 mg (2 tab) x 4 days, then 20 mg (1 tab) x 4 days, then 10 mg (1/2 tab) x 4 days     promethazine 50 MG/ML injection  Commonly known as: PHENERGAN     tiZANidine 2 MG tablet  Commonly known as: ZANAFLEX           * This list has 2 medication(s) that are the same as other medications prescribed for you. Read the directions carefully, and ask your doctor or other care provider to review them with you.                   Where to Get Your Medications        These medications were sent to [facility], [place] - [address] - [place] [phone] Kaylene Pascal [phone]  [address] Johnella Naas [place] [address]      Phone: [phone]   lidocaine 5 %           DISCONTINUED MEDICATIONS:  Current Discharge Medication List        STOP taking these medications       Lidocaine 3.5 % [facility] Comments:   Reason for Stopping:               I am the Primary Clinician of Record. Joaquin Mulberry, MD (electronically signed)    (Please note that parts of this dictation were completed with voice recognition software. Quite often unanticipated grammatical, syntax, homophones, and other interpretive errors are inadvertently transcribed by the computer software. Please disregards these errors. Please excuse any errors that have escaped final proofreading.)     Margot Sheng, MD  12/04/23 1003

## 2023-12-04 NOTE — ED Triage Notes (Signed)
 Pt reports she was walking her dog and twisted her left ankle, swelling and bruising noted to left  foot. Pt tearful in triage stating "I have enough stress already". Pt unable to bear weight at this time

## 2023-12-14 ENCOUNTER — Emergency Department: Admit: 2023-12-14 | Payer: Medicaid (Managed Care)

## 2023-12-14 ENCOUNTER — Inpatient Hospital Stay
Admit: 2023-12-14 | Discharge: 2023-12-14 | Disposition: A | Discharge status: Home / Self Care | Payer: Medicaid (Managed Care) | Attending: Emergency Medicine

## 2023-12-14 DIAGNOSIS — S63501A Unspecified sprain of right wrist, initial encounter: Secondary | ICD-10-CM

## 2023-12-14 MED ORDER — IBUPROFEN 600 MG PO TABS
600 | ORAL | Status: AC
Start: 2023-12-14 — End: 2023-12-14
  Administered 2023-12-14: 14:00:00 600 mg via ORAL

## 2023-12-14 MED FILL — IBUPROFEN 600 MG PO TABS: 600 mg | ORAL | Qty: 1 | Fill #0

## 2023-12-14 NOTE — Discharge Instructions (Signed)
 Thank you for choosing our Emergency Department for your care.  It is our privilege to care for you in your time of need.  In the next several days, you may receive a survey via email or mailed to your home about your experience with our team.  We would greatly appreciate you taking a few minutes to complete the survey, as we use this information to learn what we have done well and what we could be doing better. Thank you for trusting us  with your care!    Below you will find a list of your tests from today's visit.   Labs and Radiology Studies  No results found for this or any previous visit (from the past 12 hours).  XR WRIST RIGHT (MIN 3 VIEWS)  Result Date: 12/14/2023  EXAM: XR WRIST RIGHT (MIN 3 VIEWS) INDICATION: Right wrist pain pain. COMPARISON: None. FINDINGS: Three  views of the right wrist demonstrate no fracture or other acute osseous or articular abnormality. The soft tissues are within normal limits. Degenerative change is moderate, greatest at the first [facility].     No acute abnormality. Electronically signed by Arva Lathe    XRAY WRIST 3+ VIEWS RIGHT  Result Date: 12/13/2023  Exam: 4 views right wrist DATE OF EXAM: 12/13/2023 INDICATIONS:   difficulty w/ ROM due to pain. no mechanical injury COMPARISON: None    Findings and impression: No fracture or dislocation. No significant soft tissue swelling. Reading Doctor: Manley Seeds Electronic Signature by: Tulloh, Rosemary    ------------------------------------------------------------------------------------------------------------  The evaluation and treatment you received in the Emergency Department were for an urgent problem. It is important that you follow-up with a doctor, nurse practitioner, or physician assistant to:  (1) confirm your diagnosis,  (2) re-evaluation of changes in your illness and treatment, and (3) for ongoing care. Please take your discharge instructions with you when you go to your follow-up appointment.     If you have  any problem arranging a follow-up appointment, contact us !  If your symptoms become worse or you do not improve as expected, please return to us . We are available 24 hours a day.     If a prescription has been provided, please fill it as soon as possible to prevent a delay in treatment. If you have any questions or reservations about taking the medication due to side effects or interactions with other medications, please call your primary care provider or contact us  directly.  Again, THANK YOU for choosing us  to care for YOU!

## 2023-12-14 NOTE — ED Notes (Signed)
 PT reports understanding of d/c instructions. Pt reports comfort with wrist splint in place as ordered

## 2023-12-14 NOTE — ED Triage Notes (Signed)
 Reports right wrist pain x 3 days. PT reports she twisted it the wrong way, no obvious injuries noted.

## 2023-12-14 NOTE — ED Provider Notes (Signed)
 [facility] EMERGENCY DEPT  EMERGENCY DEPARTMENT HISTORY AND PHYSICAL EXAM      Date of evaluation: 12/14/2023  Patient Name: Laurie Miller 10-31-1960  MRN: [id number]  ED Provider: Bethel Brooms, MD   Note Started: 9:16 AM EDT 12/14/23    HISTORY OF PRESENT ILLNESS     Chief Complaint   Patient presents with    Wrist Pain       History Provided By: Patient, only     HPI: Laurie Miller is a 63 y.o. female presents to ED three days after twisting her right wrist resulting in pain of her distal RUE. Pain worse with use, actively flexing and extending. No bruising or redness per pt.     PAST MEDICAL HISTORY   Past Medical History:  Past Medical History:   Diagnosis Date    Bursitis     CKD (chronic kidney disease)     Fatty tumor     Hypertension     Migraine        Past Surgical History:  Past Surgical History:   Procedure Laterality Date    ORTHOPEDIC SURGERY         Family History:  History reviewed. No pertinent family history.    Social History:  Social History     Tobacco Use    Smoking status: Never     Passive exposure: Never    Smokeless tobacco: Never   Vaping Use    Vaping status: Never Used   Substance Use Topics    Alcohol use: Not Currently    Drug use: Never       Allergies:  Allergies   Allergen Reactions    Aspirin Nausea Only    Celecoxib Nausea Only    Gabapentin     Hydrocodone-Acetaminophen  Nausea Only    Ibuprofen  Nausea Only    Ketorolac  Nausea Only    Meperidine Itching    Naproxen Nausea Only    Nsaids     Oxycodone -Acetaminophen  Itching    Tramadol  Nausea Only    Ubrogepant     Clarithromycin Rash    Penicillins Angioedema and Rash     Hemorrhage    hemorrhage    Sulfa Antibiotics Hives and Rash     Other Reaction(s): Itchy, rash       PCP: No primary care provider on file.    Current Meds:   No current facility-administered medications for this encounter.     Current Outpatient Medications   Medication Sig Dispense Refill    lidocaine  (LIDODERM ) 5 % Place 1 patch onto the skin daily for 10 days 12  hours on, 12 hours off. 10 patch 0    ondansetron  (ZOFRAN ) 4 MG tablet Take 1 tablet by mouth 3 times daily as needed for Nausea or Vomiting 15 tablet 0    albuterol  sulfate HFA (VENTOLIN  HFA) 108 (90 Base) MCG/ACT inhaler Inhale 2 puffs into the lungs 4 times daily as needed for Wheezing 54 g 1    butalbital -acetaminophen -caffeine  (FIORICET , ESGIC ) 50-325-40 MG per tablet Take 1 tablet by mouth every 4 hours as needed for Headaches 12 tablet 0    amLODIPine-olmesartan (AZOR) 10-40 MG per tablet Take 1 tablet by mouth daily      tiZANidine (ZANAFLEX) 2 MG tablet Take 1 tablet by mouth daily as needed      COMBIGAN 0.2-0.5 % ophthalmic solution instill 1 DROP IN EACH EYE TWICE DAILY      hydrocortisone  (SCALACORT) 2 % LOTN lotion Apply to affected  area twice daily 1 each 0    predniSONE  (DELTASONE ) 20 MG tablet Take 40 mg (2 tab) x 4 days, then 20 mg (1 tab) x 4 days, then 10 mg (1/2 tab) x 4 days 14 tablet 0    butalbital -APAP-caffeine  (FIORICET ) 50-300-40 MG CAPS per capsule Take 1 capsule by mouth every 4 hours as needed for Headaches 15 capsule 0    ibuprofen  (ADVIL ;MOTRIN ) 600 MG tablet Take 1 tablet by mouth 4 times daily as needed for Pain 20 tablet 0    acetaminophen  (TYLENOL ) 500 MG tablet Take 1 tablet by mouth every 6 hours as needed for Pain 30 tablet 1    fluticasone  (FLONASE ) 50 MCG/ACT nasal spray 2 sprays by Each Nostril route daily 16 g 0    diazePAM (VALIUM) 2 MG tablet Take 5 mg by mouth every 8 hours as needed.      ketorolac  (TORADOL ) 10 MG tablet Take 10 mg by mouth every 6 hours as needed      promethazine (PHENERGAN) 50 MG/ML injection by NOT APPLICABLE route         Social Determinants of Health:   Social Drivers of Health     Tobacco Use: Low Risk  (12/14/2023)    Patient History     Smoking Tobacco Use: Never     Smokeless Tobacco Use: Never     Passive Exposure: Never   Alcohol Use: Patient Declined (12/04/2023)    AUDIT-C     Frequency of Alcohol Consumption: Patient declined     Average  Number of Drinks: Patient declined     Frequency of Binge Drinking: Patient declined   Financial Resource Strain: High Risk (11/24/2023)    Received from [facility]    Overall Financial Resource Strain (CARDIA)     Difficulty of Paying Living Expenses: Very hard   Food Insecurity: Food Insecurity Present (11/24/2023)    Received from [facility]    Hunger Vital Sign     Worried About Running Out of Food in the Last Year: Often true     Ran Out of Food in the Last Year: Often true   Transportation Needs: No Transportation Needs (11/24/2023)    Received from [facility]    PRAPARE - Transportation     Lack of Transportation (Medical): No     Lack of Transportation (Non-Medical): No   Physical Activity: Not on file   Stress: Not on file   Social Connections: Not on file   Intimate Partner Violence: Not on file   Depression: Not on file   Housing Stability: High Risk (11/24/2023)    Received from [facility]    Housing     Within the past 12 months, have you ever stayed: outside, in a car, in a tent, in an overnight shelter, or temporarily in someone else's home(i.e.couch-surfing)?: Yes     Are you worried about losing your housing?: Yes   Interpersonal Safety: Not At Risk (07/29/2023)    Interpersonal Safety Domain Source: IP Abuse Screening     Physical abuse: Denies     Verbal abuse: Denies     Emotional abuse: Denies     Financial abuse: Denies     Sexual abuse: Denies   Utilities: High Risk (11/24/2023)    Received from [facility]    Utilities     Within the past 12 months, have you been unable to get utilities(heat, electricity) when it was really needed?: Yes  PHYSICAL EXAM   Physical Exam  Vitals and nursing note reviewed.   Constitutional:       General: She is not in acute distress.     Appearance: She is well-developed. She is not ill-appearing or toxic-appearing.   Cardiovascular:      Rate and Rhythm: Normal rate.   Pulmonary:      Effort: Pulmonary effort is normal.   Musculoskeletal:          General: Swelling and tenderness present. Normal range of motion.      Comments: Ttp over distal ulna  Ttp over distal radius  V mild swelling  No snuff box ttp  2+ radial pulse right  5/5 grip strength but painful     Skin:     General: Skin is warm.      Capillary Refill: Capillary refill takes less than 2 seconds.   Neurological:      General: No focal deficit present.      Mental Status: She is alert.         SCREENINGS                No data recorded       LAB, EKG AND DIAGNOSTIC RESULTS   Labs:  No results found for this or any previous visit (from the past 12 hours).    EKG:.Not Applicable     Radiologic Studies:  Radiographic images are visualized and preliminarily interpreted by the ED Provider with the following findings: Not Applicable..     Interpretation per the Radiologist below, if available at the time of this note:  XR WRIST RIGHT (MIN 3 VIEWS)   Final Result   No acute abnormality.      Electronically signed by Arva Lathe           Records Reviewed: Not Applicable    MEDICAL DECISION MAKING and ED COURSE   10:19 AM Differential and Considerations of tests not ordered: Given mechanism, sprain, strain more likely than fracture as she did not have a FOSH injury. Will image to ensure no fracture. Provide anti-inflammatory.      Vitals:    Vitals:    12/14/23 0911 12/14/23 0915 12/14/23 1014   BP: 137/71  97/69   Pulse: 69  70   Resp: 18  18   Temp: 97.8 F (36.6 C)     SpO2: 97% 98% 98%       ED COURSE       Clinical Management Tools:  Not Applicable    Smoking Cessation: Not Applicable    Patient was given the following medications:  Medications   ibuprofen  (ADVIL ;MOTRIN ) tablet 600 mg (600 mg Oral Given 12/14/23 1009)       CONSULTS: See ED Course/MDM for further details.  None   PROCEDURES   Unless otherwise noted above, none  Procedures    SEPSIS REASSESSMENT & CRITICAL CARE TIME   SEPSIS REASSESSMENT: Patient does NOT meet Sepsis criteria after ED workup    Patient does not meet  Critical Care Time, 0 minutes  CLINICAL IMPRESSIONS     1. Sprain of right wrist, initial encounter       SDOH/DISPOSITION/PLAN   Social Determinants affecting Treatment Plan: None    DISPOSITION               Discharge Note: The patient is stable for discharge home. The signs, symptoms, diagnosis, and discharge instructions have been discussed, understanding conveyed, and agreed upon. The patient is to  follow up as recommended or return to ER should their symptoms worsen.      PATIENT REFERRED TO:  Miller Allis, MD  [address]  [address] [place] [address]  [phone]    Schedule an appointment as soon as possible for a visit   As needed if symptoms not improving        DISCHARGE MEDICATIONS:     Medication List        ASK your doctor about these medications      acetaminophen  500 MG tablet  Commonly known as: TYLENOL   Take 1 tablet by mouth every 6 hours as needed for Pain     albuterol  sulfate HFA 108 (90 Base) MCG/ACT inhaler  Commonly known as: Ventolin  HFA  Inhale 2 puffs into the lungs 4 times daily as needed for Wheezing     amLODIPine-olmesartan 10-40 MG per tablet  Commonly known as: AZOR     * butalbital -APAP-caffeine  50-300-40 MG Caps per capsule  Commonly known as: Fioricet   Take 1 capsule by mouth every 4 hours as needed for Headaches     * butalbital -acetaminophen -caffeine  50-325-40 MG per tablet  Commonly known as: FIORICET , ESGIC   Take 1 tablet by mouth every 4 hours as needed for Headaches     Combigan 0.2-0.5 % ophthalmic solution  Generic drug: brimonidine-timolol     diazePAM 2 MG tablet  Commonly known as: VALIUM     fluticasone  50 MCG/ACT nasal spray  Commonly known as: FLONASE   2 sprays by Each Nostril route daily     hydrocortisone  2 % Lotn lotion  Commonly known as: SCALACORT  Apply to affected area twice daily     ibuprofen  600 MG tablet  Commonly known as: ADVIL ;MOTRIN   Take 1 tablet by mouth 4 times daily as needed for Pain     ketorolac  10 MG tablet  Commonly known as:  TORADOL      lidocaine  5 %  Commonly known as: LIDODERM   Place 1 patch onto the skin daily for 10 days 12 hours on, 12 hours off.     ondansetron  4 MG tablet  Commonly known as: ZOFRAN   Take 1 tablet by mouth 3 times daily as needed for Nausea or Vomiting     predniSONE  20 MG tablet  Commonly known as: DELTASONE   Take 40 mg (2 tab) x 4 days, then 20 mg (1 tab) x 4 days, then 10 mg (1/2 tab) x 4 days     promethazine 50 MG/ML injection  Commonly known as: PHENERGAN     tiZANidine 2 MG tablet  Commonly known as: ZANAFLEX           * This list has 2 medication(s) that are the same as other medications prescribed for you. Read the directions carefully, and ask your doctor or other care provider to review them with you.                    DISCONTINUED MEDICATIONS:  Current Discharge Medication List          I am the Primary Clinician of Record. Bethel Brooms, MD (electronically signed)    (Please note that parts of this dictation were completed with voice recognition software. Quite often unanticipated grammatical, syntax, homophones, and other interpretive errors are inadvertently transcribed by the computer software. Please disregards these errors. Please excuse any errors that have escaped final proofreading.)     Bethel Brooms, MD  12/14/23 1023

## 2024-01-17 ENCOUNTER — Ambulatory Visit: Payer: Medicaid (Managed Care) | Attending: Family

## 2024-01-27 ENCOUNTER — Emergency Department: Admit: 2024-01-27 | Payer: Medicaid (Managed Care)

## 2024-01-27 ENCOUNTER — Inpatient Hospital Stay
Admit: 2024-01-27 | Discharge: 2024-01-27 | Disposition: A | Discharge status: Home / Self Care | Payer: Medicaid (Managed Care) | Arrived: WI | Attending: Emergency Medicine

## 2024-01-27 DIAGNOSIS — J209 Acute bronchitis, unspecified: Secondary | ICD-10-CM

## 2024-01-27 MED ORDER — BENZONATATE 100 MG PO CAPS
100 | ORAL_CAPSULE | Freq: Two times a day (BID) | ORAL | 0 refills | Status: AC | PRN
Start: 2024-01-27 — End: 2024-02-03

## 2024-01-27 MED ORDER — BENZONATATE 100 MG PO CAPS
100 | ORAL | Status: AC
Start: 2024-01-27 — End: 2024-01-27
  Administered 2024-01-27: 13:00:00 100 mg via ORAL

## 2024-01-27 MED FILL — BENZONATATE 100 MG PO CAPS: 100 MG | ORAL | Qty: 1

## 2024-01-27 NOTE — ED Notes (Signed)
 X-ray to bedside

## 2024-01-27 NOTE — ED Triage Notes (Signed)
 Patient presents for complaint of scratchy throat and cough for 4 days.  Patient states cough is making her ribs sore and making her have sleeping difficulty.

## 2024-01-27 NOTE — ED Provider Notes (Signed)
 [facility] EM[facility]Y DEPT  EMERGENCY DEPARTMENT HISTORY AND PHYSICAL EXAM      Date: 01/27/2024  Patient Name: Laurie Miller  MRN: [id number]  Birthdate Aug 26, 1960  Date of evaluation: 01/27/2024  Provider: Elsie FORBES Parma, MD   Note Started: 9:28 AM EDT 01/27/24    HISTORY OF PRESENT ILLNESS     Chief Complaint   Patient presents with    Cough       History Provided By: Patient    HPI: Laurie Miller is a 63 y.o. female presents to the ED ambulatory accompanied by her husband with a dry cough for the past 24 hours she has had some fatigue she denies any frank fever the cough is not productive she has been slightly hoarse with her voice she denies any nausea vomiting diarrhea or rashes has history of bursitis and chronic kidney disease    PAST MEDICAL HISTORY   Past Medical History:  Past Medical History:   Diagnosis Date    Bursitis     CKD (chronic kidney disease)     Fatty tumor     Hypertension     Migraine        Past Surgical History:  Past Surgical History:   Procedure Laterality Date    ORTHOPEDIC SURGERY         Family History:  History reviewed. No pertinent family history.    Social History:  Social History     Tobacco Use    Smoking status: Never     Passive exposure: Never    Smokeless tobacco: Never   Vaping Use    Vaping status: Never Used   Substance Use Topics    Alcohol use: Not Currently    Drug use: Never       Allergies:  Allergies   Allergen Reactions    Aspirin Nausea Only    Celecoxib Nausea Only    Gabapentin     Hydrocodone-Acetaminophen  Nausea Only    Ibuprofen  Nausea Only    Ketorolac  Nausea Only    Meperidine Itching    Naproxen Nausea Only    Nsaids     Oxycodone -Acetaminophen  Itching    Tramadol  Nausea Only    Ubrogepant     Clarithromycin Rash    Penicillins Angioedema and Rash     Hemorrhage    hemorrhage    Sulfa Antibiotics Hives and Rash     Other Reaction(s): Itchy, rash       PCP: No primary care provider on file.    Current Meds:   No current facility-administered medications for this  encounter.     Current Outpatient Medications   Medication Sig Dispense Refill    benzonatate (TESSALON) 100 MG capsule Take 1 capsule by mouth 2 times daily as needed for Cough 10 capsule 0    ondansetron  (ZOFRAN ) 4 MG tablet Take 1 tablet by mouth 3 times daily as needed for Nausea or Vomiting 15 tablet 0    albuterol  sulfate HFA (VENTOLIN  HFA) 108 (90 Base) MCG/ACT inhaler Inhale 2 puffs into the lungs 4 times daily as needed for Wheezing 54 g 1    butalbital -acetaminophen -caffeine  (FIORICET , ESGIC ) 50-325-40 MG per tablet Take 1 tablet by mouth every 4 hours as needed for Headaches 12 tablet 0    amLODIPine-olmesartan (AZOR) 10-40 MG per tablet Take 1 tablet by mouth daily      tiZANidine (ZANAFLEX) 2 MG tablet Take 1 tablet by mouth daily as needed      COMBIGAN 0.2-0.5 % ophthalmic solution  instill 1 DROP IN EACH EYE TWICE DAILY      hydrocortisone  (SCALACORT) 2 % LOTN lotion Apply to affected area twice daily 1 each 0    predniSONE  (DELTASONE ) 20 MG tablet Take 40 mg (2 tab) x 4 days, then 20 mg (1 tab) x 4 days, then 10 mg (1/2 tab) x 4 days 14 tablet 0    butalbital -APAP-caffeine  (FIORICET ) 50-300-40 MG CAPS per capsule Take 1 capsule by mouth every 4 hours as needed for Headaches 15 capsule 0    ibuprofen  (ADVIL ;MOTRIN ) 600 MG tablet Take 1 tablet by mouth 4 times daily as needed for Pain 20 tablet 0    acetaminophen  (TYLENOL ) 500 MG tablet Take 1 tablet by mouth every 6 hours as needed for Pain 30 tablet 1    fluticasone  (FLONASE ) 50 MCG/ACT nasal spray 2 sprays by Each Nostril route daily 16 g 0    diazePAM (VALIUM) 2 MG tablet Take 5 mg by mouth every 8 hours as needed.      ketorolac  (TORADOL ) 10 MG tablet Take 10 mg by mouth every 6 hours as needed      promethazine (PHENERGAN) 50 MG/ML injection by NOT APPLICABLE route         Social Determinants of Health:   Social Drivers of Health     Tobacco Use: Low Risk  (01/27/2024)    Patient History     Smoking Tobacco Use: Never     Smokeless Tobacco Use:  Never     Passive Exposure: Never   Alcohol Use: Not At Risk (01/27/2024)    AUDIT-C     Frequency of Alcohol Consumption: Never     Average Number of Drinks: Patient does not drink     Frequency of Binge Drinking: Never   Financial Resource Strain: Low Risk  (12/14/2023)    Received from [facility]    Overall Financial Resource Strain (CARDIA)     Difficulty of Paying Living Expenses: Not very hard   Recent Concern: Financial Resource Strain - High Risk (11/24/2023)    Received from [facility]    Overall Financial Resource Strain (CARDIA)     Difficulty of Paying Living Expenses: Very hard   Food Insecurity: No Food Insecurity (12/14/2023)    Received from [facility]    Hunger Vital Sign     Worried About Running Out of Food in the Last Year: Never true     Ran Out of Food in the Last Year: Never true   Recent Concern: Food Insecurity - Food Insecurity Present (11/24/2023)    Received from [facility]    Hunger Vital Sign     Worried About Running Out of Food in the Last Year: Often true     Ran Out of Food in the Last Year: Often true   Transportation Needs: No Transportation Needs (12/14/2023)    Received from [facility]    PRAPARE - Therapist, art (Medical): No     Lack of Transportation (Non-Medical): No   Physical Activity: Insufficiently Active (12/14/2023)    Received from [facility]    Exercise Vital Sign     Days of Exercise per Week: 4 days     Minutes of Exercise per Session: 30 min   Stress: No Stress Concern Present (12/14/2023)    Received from [facility]    [employer]  of Occupational Health - Occupational Stress Questionnaire     Feeling of Stress : Not at all   Social Connections: Socially Integrated (12/14/2023)    Received from [facility]    Social Connection and Isolation Panel [NHANES]     Frequency of Communication with Friends and  Family: More than three times a week     Frequency of Social Gatherings with Friends and Family: More than three times a week     Attends Religious Services: More than 4 times per year     Active Member of [employer] or Organizations: Yes     Attends Banker Meetings: More than 4 times per year     Marital Status: Married   Catering manager Violence: Not At Risk (12/14/2023)    Received from [facility] ([facility]    Humiliation, Afraid, Rape, and Kick questionnaire     Fear of Current or Ex-Partner: No     Emotionally Abused: No     Physically Abused: No     Sexually Abused: No   Depression: Not at risk (12/14/2023)    Received from [facility]    PHQ-2     PHQ Total Score: 0   Housing Stability: Low Risk  (12/14/2023)    Received from [facility]    Housing Stability Vital Sign     Unable to Pay for Housing in the Last Year: No     Number of Times Moved in the Last Year: 0     Homeless in the Last Year: No   Recent Concern: Housing Stability - High Risk (11/24/2023)    Received from [facility]    Housing     Within the past 12 months, have you ever stayed: outside, in a car, in a tent, in an overnight shelter, or temporarily in someone else's home(i.e.couch-surfing)?: Yes     Are you worried about losing your housing?: Yes   Interpersonal Safety: Not At Risk (01/27/2024)    Interpersonal Safety Domain Source: IP Abuse Screening     Physical abuse: Denies     Verbal abuse: Denies     Emotional abuse: Denies     Financial abuse: Denies     Sexual abuse: Denies   Utilities: Not At Risk (12/14/2023)    Received from [facility]    AHC Utilities     Threatened with loss of utilities: No   Recent Concern: Utilities - High Risk (11/24/2023)    Received from [facility]    Utilities     Within the past 12 months, have you been unable to get utilities(heat, electricity) when it was really needed?: Yes       PHYSICAL EXAM   Physical  Exam  Vitals and nursing note reviewed.   Constitutional:       Appearance: She is obese.   HENT:      Head: Normocephalic.      Right Ear: Tympanic membrane normal.      Left Ear: Tympanic membrane normal.      Nose: No congestion.      Mouth/Throat:      Mouth: Mucous membranes are moist.   Eyes:      Pupils: Pupils are equal, round, and reactive to light.   Cardiovascular:      Rate and Rhythm: Normal rate.   Pulmonary:      Effort: Pulmonary effort is normal.  Abdominal:      General: Abdomen is flat.   Skin:     General: Skin is warm.      Capillary Refill: Capillary refill takes less than 2 seconds.   Neurological:      General: No focal deficit present.      Mental Status: She is alert.   Psychiatric:         Mood and Affect: Mood normal.           SCREENINGS                No data recorded    LAB, EKG AND DIAGNOSTIC RESULTS   Labs:  No results found for this or any previous visit (from the past 12 hours).    EKG:.    Radiologic Studies:  Non-plain film images such as CT, Ultrasound and MRI are read by the radiologist. Plain radiographic images are visualized and preliminarily interpreted by the ED Provider with the following findings:     Interpretation per the Radiologist below, if available at the time of this note:  XR CHEST PORTABLE   Final Result      No acute process on portable chest.         Electronically signed by Oneil Collier           ED COURSE and DIFFERENTIAL DIAGNOSIS/MDM   9:28 AM Differential and Considerations:     Records Reviewed (source and summary of external notes): Prior medical records and Nursing notes.    Vitals:    Vitals:    01/27/24 0800 01/27/24 0820 01/27/24 0830 01/27/24 0840   BP: (!) 124/59  135/70 133/65   Pulse: 80 77 77 76   Resp:  15  12   Temp:       TempSrc:       SpO2: 96% 96% 92% 91%   Weight:       Height:            ED COURSE       SEPSIS Reassessment:     Clinical Management Tools:      Patient was given the following medications:  Medications   benzonatate  (TESSALON) capsule 100 mg (100 mg Oral Given 01/27/24 0857)       CONSULTS: See ED Course/MDM for further details.  None     Social Determinants affecting Diagnosis/Treatment:     Smoking Cessation:     PROCEDURES   Unless otherwise noted above, none  Procedures      CRITICAL CARE TIME       ED IMPRESSION     1. Acute bronchitis, unspecified organism    2. Acute viral syndrome          DISPOSITION/PLAN   DISPOSITION Decision To Discharge 01/27/2024 09:02:09 AM   DISPOSITION CONDITION Stable         Discharge Note: The patient is stable for discharge home. The signs, symptoms, diagnosis, and discharge instructions have been discussed, understanding conveyed, and agreed upon. The patient is to follow up as recommended or return to ER should their symptoms worsen.      PATIENT REFERRED TO:  No follow-up provider specified.      DISCHARGE MEDICATIONS:     Medication List        START taking these medications      benzonatate 100 MG capsule  Commonly known as: TESSALON  Take 1 capsule by mouth 2 times daily as needed for Cough  ASK your doctor about these medications      acetaminophen  500 MG tablet  Commonly known as: TYLENOL   Take 1 tablet by mouth every 6 hours as needed for Pain     albuterol  sulfate HFA 108 (90 Base) MCG/ACT inhaler  Commonly known as: Ventolin  HFA  Inhale 2 puffs into the lungs 4 times daily as needed for Wheezing     amLODIPine-olmesartan 10-40 MG per tablet  Commonly known as: AZOR     * butalbital -APAP-caffeine  50-300-40 MG Caps per capsule  Commonly known as: Fioricet   Take 1 capsule by mouth every 4 hours as needed for Headaches     * butalbital -acetaminophen -caffeine  50-325-40 MG per tablet  Commonly known as: FIORICET , ESGIC   Take 1 tablet by mouth every 4 hours as needed for Headaches     Combigan 0.2-0.5 % ophthalmic solution  Generic drug: brimonidine-timolol     diazePAM 2 MG tablet  Commonly known as: VALIUM     fluticasone  50 MCG/ACT nasal spray  Commonly known as: FLONASE   2  sprays by Each Nostril route daily     hydrocortisone  2 % Lotn lotion  Commonly known as: SCALACORT  Apply to affected area twice daily     ibuprofen  600 MG tablet  Commonly known as: ADVIL ;MOTRIN   Take 1 tablet by mouth 4 times daily as needed for Pain     ketorolac  10 MG tablet  Commonly known as: TORADOL      ondansetron  4 MG tablet  Commonly known as: ZOFRAN   Take 1 tablet by mouth 3 times daily as needed for Nausea or Vomiting     predniSONE  20 MG tablet  Commonly known as: DELTASONE   Take 40 mg (2 tab) x 4 days, then 20 mg (1 tab) x 4 days, then 10 mg (1/2 tab) x 4 days     promethazine 50 MG/ML injection  Commonly known as: PHENERGAN     tiZANidine 2 MG tablet  Commonly known as: ZANAFLEX           * This list has 2 medication(s) that are the same as other medications prescribed for you. Read the directions carefully, and ask your doctor or other care provider to review them with you.                   Where to Get Your Medications        Information about where to get these medications is not yet available    Ask your nurse or doctor about these medications  benzonatate 100 MG capsule           DISCONTINUED MEDICATIONS:  Discharge Medication List as of 01/27/2024  9:12 AM          I am the Primary Clinician of Record. Elsie FORBES Parma, MD (electronically signed)    (Please note that parts of this dictation were completed with voice recognition software. Quite often unanticipated grammatical, syntax, homophones, and other interpretive errors are inadvertently transcribed by the computer software. Please disregards these errors. Please excuse any errors that have escaped final proofreading.)     Khloee Garza E, MD  01/27/24 [id number]

## 2024-01-27 NOTE — Discharge Instructions (Signed)
 Use the Tessalon as prescribed for cough.
